# Patient Record
Sex: Female | Born: 1990 | Race: White | Hispanic: No | Smoking: Current some day smoker
Health system: Southern US, Community
[De-identification: ages and names within clinical notes are randomized; demographics above are authoritative.]

## PROBLEM LIST (undated history)

## (undated) DIAGNOSIS — F1110 Opioid abuse, uncomplicated: Secondary | ICD-10-CM

## (undated) DIAGNOSIS — K829 Disease of gallbladder, unspecified: Secondary | ICD-10-CM

## (undated) DIAGNOSIS — B1920 Unspecified viral hepatitis C without hepatic coma: Secondary | ICD-10-CM

---
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
## 2013-04-26 ENCOUNTER — Emergency Department (HOSPITAL_COMMUNITY)
Admission: EM | Admit: 2013-04-26 | Discharge: 2013-04-26 | Discharge status: Against Medical Advice | Payer: Self-pay | Attending: Emergency Medicine | Admitting: Emergency Medicine

## 2013-04-26 ENCOUNTER — Encounter (HOSPITAL_COMMUNITY): Payer: Self-pay | Admitting: Emergency Medicine

## 2013-04-26 DIAGNOSIS — R112 Nausea with vomiting, unspecified: Secondary | ICD-10-CM | POA: Insufficient documentation

## 2013-04-26 DIAGNOSIS — R109 Unspecified abdominal pain: Secondary | ICD-10-CM | POA: Insufficient documentation

## 2013-04-26 DIAGNOSIS — F17200 Nicotine dependence, unspecified, uncomplicated: Secondary | ICD-10-CM | POA: Insufficient documentation

## 2013-04-26 HISTORY — DX: Disease of gallbladder, unspecified: K82.9

## 2013-04-26 HISTORY — DX: Unspecified viral hepatitis C without hepatic coma: B19.20

## 2013-04-26 LAB — CBC WITH DIFFERENTIAL/PLATELET
Lymphocytes Relative: 31 % (ref 12–46)
Lymphs Abs: 2.1 10*3/uL (ref 0.7–4.0)
Neutro Abs: 3.9 10*3/uL (ref 1.7–7.7)
Neutrophils Relative %: 59 % (ref 43–77)
Platelets: 227 10*3/uL (ref 150–400)
RBC: 4.23 MIL/uL (ref 3.87–5.11)
WBC: 6.7 10*3/uL (ref 4.0–10.5)

## 2013-04-26 LAB — COMPREHENSIVE METABOLIC PANEL WITH GFR
ALT: 41 U/L — ABNORMAL HIGH (ref 0–35)
AST: 36 U/L (ref 0–37)
Albumin: 3.5 g/dL (ref 3.5–5.2)
Alkaline Phosphatase: 106 U/L (ref 39–117)
BUN: 7 mg/dL (ref 6–23)
CO2: 27 meq/L (ref 19–32)
Calcium: 9.2 mg/dL (ref 8.4–10.5)
Chloride: 101 meq/L (ref 96–112)
Creatinine, Ser: 0.65 mg/dL (ref 0.50–1.10)
GFR calc Af Amer: >90 mL/min
GFR calc non Af Amer: >90 mL/min
Glucose, Bld: 89 mg/dL (ref 70–99)
Potassium: 3.6 meq/L (ref 3.5–5.1)
Sodium: 138 meq/L (ref 135–145)
Total Bilirubin: 0.2 mg/dL — ABNORMAL LOW (ref 0.3–1.2)
Total Protein: 7 g/dL (ref 6.0–8.3)

## 2013-04-26 LAB — URINE MICROSCOPIC-ADD ON

## 2013-04-26 LAB — URINALYSIS, ROUTINE W REFLEX MICROSCOPIC
Bilirubin Urine: NEGATIVE
Glucose, UA: NEGATIVE mg/dL
Hgb urine dipstick: NEGATIVE
Protein, ur: NEGATIVE mg/dL
Specific Gravity, Urine: 1.021 (ref 1.005–1.030)
Urobilinogen, UA: 1 mg/dL (ref 0.0–1.0)

## 2013-04-26 LAB — POCT PREGNANCY, URINE: Preg Test, Ur: NEGATIVE

## 2013-04-26 NOTE — ED Notes (Signed)
Patient has left the ED at this time.

## 2013-04-26 NOTE — ED Notes (Signed)
PT. REPORTS PERSISTENT RIGHT ABDOMINAL PAIN WITH NAUSEA AND VOMITTING FOR SEVERAL DAYS , DIAGNOSED WITH GALL BLADDER DISEASE LAST WEEK AT [facility] PRESCRIBED WITH ORAL ANTIBIOTIC AND REFERRED TO A SURGEON , DENIES FEVER OR CHILLS.

## 2013-04-28 LAB — URINE CULTURE: Colony Count: NO GROWTH

## 2014-07-29 ENCOUNTER — Emergency Department (HOSPITAL_COMMUNITY): Payer: Self-pay

## 2014-07-29 ENCOUNTER — Emergency Department (HOSPITAL_COMMUNITY)
Admission: EM | Admit: 2014-07-29 | Discharge: 2014-07-29 | Disposition: A | Discharge status: Home / Self Care | Payer: Self-pay | Attending: Emergency Medicine | Admitting: Emergency Medicine

## 2014-07-29 ENCOUNTER — Encounter (HOSPITAL_COMMUNITY): Payer: Self-pay | Admitting: Emergency Medicine

## 2014-07-29 DIAGNOSIS — Z8619 Personal history of other infectious and parasitic diseases: Secondary | ICD-10-CM | POA: Insufficient documentation

## 2014-07-29 DIAGNOSIS — W231XXA Caught, crushed, jammed, or pinched between stationary objects, initial encounter: Secondary | ICD-10-CM | POA: Insufficient documentation

## 2014-07-29 DIAGNOSIS — Z8719 Personal history of other diseases of the digestive system: Secondary | ICD-10-CM | POA: Insufficient documentation

## 2014-07-29 DIAGNOSIS — M79641 Pain in right hand: Secondary | ICD-10-CM

## 2014-07-29 DIAGNOSIS — Z720 Tobacco use: Secondary | ICD-10-CM | POA: Insufficient documentation

## 2014-07-29 DIAGNOSIS — S60221A Contusion of right hand, initial encounter: Secondary | ICD-10-CM | POA: Insufficient documentation

## 2014-07-29 DIAGNOSIS — Y92003 Bedroom of unspecified non-institutional (private) residence as the place of occurrence of the external cause: Secondary | ICD-10-CM | POA: Insufficient documentation

## 2014-07-29 DIAGNOSIS — Y9389 Activity, other specified: Secondary | ICD-10-CM | POA: Insufficient documentation

## 2014-07-29 DIAGNOSIS — Y998 Other external cause status: Secondary | ICD-10-CM | POA: Insufficient documentation

## 2014-07-29 DIAGNOSIS — M25531 Pain in right wrist: Secondary | ICD-10-CM

## 2014-07-29 MED ORDER — NAPROXEN 500 MG PO TABS: 500.0000 mg | ORAL_TABLET | Freq: Two times a day (BID) | ORAL | Status: DC | PRN

## 2014-07-29 MED ORDER — HYDROCODONE-ACETAMINOPHEN 5-325 MG PO TABS
1.0000 | ORAL_TABLET | Freq: Once | ORAL | Status: AC
  Administered 2014-07-29: 1 via ORAL
  Filled 2014-07-29: qty 1

## 2014-07-29 MED ORDER — HYDROCODONE-ACETAMINOPHEN 5-325 MG PO TABS: 1.0000 | ORAL_TABLET | Freq: Four times a day (QID) | ORAL | Status: DC | PRN

## 2014-07-29 NOTE — ED Notes (Signed)
Pt returned from xray

## 2014-07-29 NOTE — ED Notes (Signed)
Pt c/o left hand pain after smashing it in the drawer. Skin is intact, mild swelling present. NAD

## 2014-07-29 NOTE — ED Provider Notes (Signed)
CSN: [id number]     Arrival date & time 07/29/14  1734 History   None    This chart was scribed for non-physician practitioner working with Purvis SheffieldForrest Harrison, MD by Arlan OrganAshley Leger, ED Scribe. This patient was seen in room TR08C/TR08C and the patient's care was started at 5:38 PM.   No chief complaint on file.  Patient is a 23 y.o. female presenting with hand pain. The history is provided by the patient. No language interpreter was used.  Hand Pain This is a new problem. The current episode started 6 to 12 hours ago. The problem occurs constantly. The problem has been gradually worsening. Pertinent negatives include no chest pain, no abdominal pain, no headaches and no shortness of breath. The symptoms are aggravated by bending and twisting. Nothing relieves the symptoms. She has tried acetaminophen and a cold compress for the symptoms. The treatment provided no relief.    HPI Comments: Madeline Andrade is a 23 y.o. female who presents to the Emergency Department complaining of constant, moderate R hand pain with associated mild swelling x 6-7 hours that has progressively worsening which began after she smashed her hand between the wall in her bedroom and her dresser at 10am. Pain is described as achy, throbbing, and sharp. Currently pain is rated 8/10. Pain is exacerbated with all movement of hand and wrist as well as palpation of the area. No alleviating factors at this time. She has tried ice application and OTC Tylenol without any improvement for symptoms. Last dose 11 AM this morning. Endorses bruising and swelling to the area. She denies any numbness or paresthesias. No weakness. No CP, SOB, fever, or chills. No known allergies to medications. No other concerns this visit.  Past Medical History  Diagnosis Date  . Hepatitis C   . Gall bladder disease    Past Surgical History  Procedure Laterality Date  . Cesarean section     No family history on file. History  Substance Use Topics  .  Smoking status: Current Every Day Smoker  . Smokeless tobacco: Not on file  . Alcohol Use: Yes   OB History    No data available     Review of Systems  Respiratory: Negative for shortness of breath.   Cardiovascular: Negative for chest pain.  Gastrointestinal: Negative for abdominal pain.  Musculoskeletal: Positive for joint swelling (R hand) and arthralgias (R hand and wrist).  Skin: Positive for color change (bruising). Negative for wound.  Neurological: Negative for weakness, numbness and headaches.    A complete 10 system review of systems was obtained and all systems are negative except as noted in the HPI and PMH.    Allergies  Review of patient's allergies indicates no known allergies.  Home Medications   Prior to Admission medications   Not on File   Triage Vitals: BP 115/56 mmHg  Pulse 94  Temp(Src) 98.2 F (36.8 C) (Oral)  Ht 5\' 7"  (1.702 m)  Wt 130 lb (58.968 kg)  BMI 20.36 kg/m2  SpO2 97%  LMP 07/15/2014   Physical Exam  Constitutional: She is oriented to person, place, and time. Vital signs are normal. She appears well-developed and well-nourished.  Non-toxic appearance. No distress.  HENT:  Head: Normocephalic and atraumatic.  Mouth/Throat: Mucous membranes are normal.  Eyes: Conjunctivae and EOM are normal. Right eye exhibits no discharge. Left eye exhibits no discharge.  Neck: Normal range of motion. Neck supple.  Cardiovascular: Normal rate and intact distal pulses.   Distal pulses intact  Pulmonary/Chest: Effort normal. No respiratory distress.  Abdominal: Normal appearance. She exhibits no distension.  Musculoskeletal:       Right hand: She exhibits decreased range of motion (due to pain), tenderness, bony tenderness and swelling. She exhibits normal two-point discrimination, no deformity and no laceration. Normal sensation noted. Decreased strength (due to pain) noted.       Hands: R hand with tenderness to palpation at the base of the fourth and  fifth metacarpals as well as in the medial aspect of the wrist, overlying an area of swelling and bruise which appears several days old. Range of motion of the fingers fully intact although somewhat limited by pain. Wrist range of motion limited due to pain. Full range of motion intact to elbow. Grip strength and wrist extension strength slightly diminished due to pain. Sensation grossly intact in all digits. Distal pulses intact. No abrasions or wounds.  Neurological: She is alert and oriented to person, place, and time. No sensory deficit.  Sensation and strength as described above.  Skin: Skin is warm, dry and intact. Bruising noted. No rash noted.  Bruise over the dorsal aspect of the right hand at the base of the fourth and fifth metacarpals, appears to be several days old with a lightly brownish appearance to the bruise.  Psychiatric: She has a normal mood and affect.  Nursing note and vitals reviewed.   ED Course  Procedures (including critical care time)  DIAGNOSTIC STUDIES: Oxygen Saturation is 97% on RA, Normal by my interpretation.    COORDINATION OF CARE: 5:38 PM- Will order DG Wrist Complete R and DG Hand Complete R. Discussed treatment plan with pt at bedside and pt agreed to plan.     Labs Review Labs Reviewed - No data to display  Imaging Review Dg Wrist Complete Right  07/29/2014   CLINICAL DATA:  Blunt trauma to right hand. Pain and swelling on 5th metacarpal with difficulty extending fingers  EXAM: RIGHT WRIST - COMPLETE 3+ VIEW  COMPARISON:  None.  FINDINGS: There is no evidence of fracture or dislocation. There is no evidence of arthropathy or other focal bone abnormality. Soft tissues are unremarkable.  IMPRESSION: No fracture or dislocation.   Electronically Signed   By: Genevive BiStewart  Edmunds M.D.   On: 07/29/2014 18:25   Dg Hand Complete Right  07/29/2014   CLINICAL DATA:  Initial encounter hand crush between wall and bedroom dresser tonight with pain and swelling fifth  metacarpal  EXAM: RIGHT HAND - COMPLETE 3+ VIEW  COMPARISON:  None.  FINDINGS: There is no evidence of fracture or dislocation. There is no evidence of arthropathy or other focal bone abnormality. Soft tissues are unremarkable.  IMPRESSION: Negative.   Electronically Signed   By: Esperanza Heiraymond  Rubner M.D.   On: 07/29/2014 18:23     EKG Interpretation None      MDM   Final diagnoses:  Wrist pain, acute, right  Contusion, hand, right, initial encounter  Pain of right hand    23y/o female with right hand pain after having it smashed between the wall and a dresser. Tenderness at the base of the fourth and fifth metacarpals as well as in the wrist. Neurovascularly intact with soft compartments. I question her story since the injury looks more consistent with a boxer's injury and the bruise appears several days old. Will obtain x-rays of hand and wrist to evaluate for fracture or dislocation. Will give Vicodin for pain control here.  6:34 PM Xrays negative for fx. Will  place in wrist splint for comfort. Discussed RICE therapy and rx given for pain control. Discussed f/up with hand specialist as needed for ongoing pain in 1-2wks. I explained the diagnosis and have given explicit precautions to return to the ER including for any other new or worsening symptoms. The patient understands and accepts the medical plan as it's been dictated and I have answered their questions. Discharge instructions concerning home care and prescriptions have been given. The patient is STABLE and is discharged to home in good condition.   I personally performed the services described in this documentation, which was scribed in my presence. The recorded information has been reviewed and is accurate.   BP 115/56 mmHg  Pulse 94  Temp(Src) 98.2 F (36.8 C) (Oral)  Ht 5\' 7"  (1.702 m)  Wt 130 lb (58.968 kg)  BMI 20.36 kg/m2  SpO2 97%  LMP 07/15/2014  Meds ordered this encounter  Medications  . HYDROcodone-acetaminophen  (NORCO/VICODIN) 5-325 MG per tablet 1 tablet    Sig:   . HYDROcodone-acetaminophen (NORCO) 5-325 MG per tablet    Sig: Take 1-2 tablets by mouth every 6 (six) hours as needed for severe pain.    Dispense:  6 tablet    Refill:  0    Order Specific Question:  Supervising Provider    Answer:  Eber Hong D [[id number]]  . naproxen (NAPROSYN) 500 MG tablet    Sig: Take 1 tablet (500 mg total) by mouth 2 (two) times daily as needed for mild pain, moderate pain or headache (TAKE WITH MEALS.).    Dispense:  20 tablet    Refill:  0    Order Specific Question:  Supervising Provider    Answer:  Vida Roller [address] [place], PA-C 07/29/14 1840  Purvis Sheffield, MD 07/29/14 [phone]

## 2014-07-29 NOTE — Discharge Instructions (Signed)
Wear wrist brace as needed for comfort. Ice and elevate hand/wrist throughout the day. Alternate between naprosyn and vicodin for pain relief. Do not drive or operate machinery with pain medication use. Call orthopedist follow up today or tomorrow to schedule a followup appointment for recheck of ongoing hand/wrist pain in 1-2 weeks that can be canceled with a 24-48 hour notice if complete resolution of pain. Return to the ER for changes or worsening symptoms.   Hand Contusion  A hand contusion is a deep bruise to the hand. Contusions happen when an injury causes bleeding under the skin. Signs of bruising include pain, puffiness (swelling), and discolored skin. The contusion may turn blue, purple, or yellow. HOME CARE  Put ice on the injured area.  Put ice in a plastic bag.  Place a towel between your skin and the bag.  Leave the ice on for 15-20 minutes, 03-04 times a day.  Only take medicines as told by your doctor.  Use an elastic wrap only as told. You may remove the wrap for sleeping, showering, and bathing. Take the wrap off if you lose feeling (have numbness) in your fingers, or they turn blue or cold. Put the wrap on more loosely.  Keep the hand raised (elevated) with pillows.  Avoid using your hand too much if it is painful. GET HELP RIGHT AWAY IF:   You have more redness, puffiness, or pain in your hand.  Your puffiness or pain does not get better with medicine.  You lose feeling in your hand, or you cannot move your fingers.  Your hand turns cold or blue.  You have pain when you move your fingers.  Your hand feels warm.  Your contusion does not get better in 2 days. MAKE SURE YOU:   Understand these instructions.  Will watch this condition.  Will get help right away if you are not doing well or you get worse. Document Released: 02/14/2008 Document Revised: 01/12/2014 Document Reviewed: 02/19/2012 [facility] Patient Information 2015 [place], [place]. This  information is not intended to replace advice given to you by your health care provider. Make sure you discuss any questions you have with your health care provider.  Wrist Pain A wrist sprain happens when the bands of tissue that hold the wrist joints together (ligament) stretch too much or tear. A wrist strain happens when muscles or bands of tissue that connect muscles to bones (tendons) are stretched or pulled. HOME CARE  Put ice on the injured area.  Put ice in a plastic bag.  Place a towel between your skin and the bag.  Leave the ice on for 15-20 minutes, 03-04 times a day, for the first 2 days.  Raise (elevate) the injured wrist to lessen puffiness (swelling).  Rest the injured wrist for at least 48 hours or as told by your doctor.  Wear a splint, cast, or an elastic wrap as told by your doctor.  Only take medicine as told by your doctor.  Follow up with your doctor as told. This is important. GET HELP RIGHT AWAY IF:   The fingers are puffy, very red, white, or cold and blue.  The fingers lose feeling (numb) or tingle.  The pain gets worse.  It is hard to move the fingers. MAKE SURE YOU:   Understand these instructions.  Will watch your condition.  Will get help right away if you are not doing well or get worse. Document Released: 02/14/2008 Document Revised: 11/20/2011 Document Reviewed: 10/19/2010 ExitCare Patient Information  2015 [employer]. This information is not intended to replace advice given to you by your health care provider. Make sure you discuss any questions you have with your health care provider.  Cryotherapy Cryotherapy is when you put ice on your injury. Ice helps lessen pain and puffiness (swelling) after an injury. Ice works the best when you start using it in the first 24 to 48 hours after an injury. HOME CARE  Put a dry or damp towel between the ice pack and your skin.  You may press gently on the ice pack.  Leave the ice on for no  more than 10 to 20 minutes at a time.  Check your skin after 5 minutes to make sure your skin is okay.  Rest at least 20 minutes between ice pack uses.  Stop using ice when your skin loses feeling (numbness).  Do not use ice on someone who cannot tell you when it hurts. This includes small children and people with memory problems (dementia). GET HELP RIGHT AWAY IF:  You have white spots on your skin.  Your skin turns blue or pale.  Your skin feels waxy or hard.  Your puffiness gets worse. MAKE SURE YOU:   Understand these instructions.  Will watch your condition.  Will get help right away if you are not doing well or get worse. Document Released: 02/14/2008 Document Revised: 11/20/2011 Document Reviewed: 04/20/2011 [facility] Patient Information 2015 [place], [place]. This information is not intended to replace advice given to you by your health care provider. Make sure you discuss any questions you have with your health care provider.

## 2015-01-03 ENCOUNTER — Emergency Department (HOSPITAL_COMMUNITY): Payer: Self-pay

## 2015-01-03 ENCOUNTER — Encounter (HOSPITAL_COMMUNITY): Payer: Self-pay | Admitting: Emergency Medicine

## 2015-01-03 ENCOUNTER — Emergency Department (HOSPITAL_COMMUNITY)
Admission: EM | Admit: 2015-01-03 | Discharge: 2015-01-04 | Disposition: A | Discharge status: Home / Self Care | Payer: Self-pay | Attending: Emergency Medicine | Admitting: Emergency Medicine

## 2015-01-03 DIAGNOSIS — S82002A Unspecified fracture of left patella, initial encounter for closed fracture: Secondary | ICD-10-CM

## 2015-01-03 DIAGNOSIS — Y999 Unspecified external cause status: Secondary | ICD-10-CM | POA: Insufficient documentation

## 2015-01-03 DIAGNOSIS — S82092A Other fracture of left patella, initial encounter for closed fracture: Secondary | ICD-10-CM | POA: Insufficient documentation

## 2015-01-03 DIAGNOSIS — S0181XA Laceration without foreign body of other part of head, initial encounter: Secondary | ICD-10-CM | POA: Insufficient documentation

## 2015-01-03 DIAGNOSIS — S060X0A Concussion without loss of consciousness, initial encounter: Secondary | ICD-10-CM | POA: Insufficient documentation

## 2015-01-03 DIAGNOSIS — Y9389 Activity, other specified: Secondary | ICD-10-CM | POA: Insufficient documentation

## 2015-01-03 DIAGNOSIS — S3992XA Unspecified injury of lower back, initial encounter: Secondary | ICD-10-CM | POA: Insufficient documentation

## 2015-01-03 DIAGNOSIS — S8991XA Unspecified injury of right lower leg, initial encounter: Secondary | ICD-10-CM | POA: Insufficient documentation

## 2015-01-03 DIAGNOSIS — Z8619 Personal history of other infectious and parasitic diseases: Secondary | ICD-10-CM | POA: Insufficient documentation

## 2015-01-03 DIAGNOSIS — Y92410 Unspecified street and highway as the place of occurrence of the external cause: Secondary | ICD-10-CM | POA: Insufficient documentation

## 2015-01-03 DIAGNOSIS — S99911A Unspecified injury of right ankle, initial encounter: Secondary | ICD-10-CM | POA: Insufficient documentation

## 2015-01-03 DIAGNOSIS — Z720 Tobacco use: Secondary | ICD-10-CM | POA: Insufficient documentation

## 2015-01-03 DIAGNOSIS — Z8719 Personal history of other diseases of the digestive system: Secondary | ICD-10-CM | POA: Insufficient documentation

## 2015-01-03 DIAGNOSIS — S24109A Unspecified injury at unspecified level of thoracic spinal cord, initial encounter: Secondary | ICD-10-CM | POA: Insufficient documentation

## 2015-01-03 DIAGNOSIS — S0990XA Unspecified injury of head, initial encounter: Secondary | ICD-10-CM | POA: Insufficient documentation

## 2015-01-03 DIAGNOSIS — S99912A Unspecified injury of left ankle, initial encounter: Secondary | ICD-10-CM | POA: Insufficient documentation

## 2015-01-03 LAB — I-STAT CHEM 8, ED
BUN: 5 mg/dL — ABNORMAL LOW (ref 6–23)
CALCIUM ION: 1.18 mmol/L (ref 1.12–1.23)
CHLORIDE: 106 mmol/L (ref 96–112)
Creatinine, Ser: 0.8 mg/dL (ref 0.50–1.10)
Glucose, Bld: 123 mg/dL — ABNORMAL HIGH (ref 70–99)
HCT: 43 % (ref 36.0–46.0)
Hemoglobin: 14.6 g/dL (ref 12.0–15.0)
Potassium: 3.3 mmol/L — ABNORMAL LOW (ref 3.5–5.1)
Sodium: 142 mmol/L (ref 135–145)
TCO2: 19 mmol/L (ref 0–100)

## 2015-01-03 LAB — URINALYSIS, ROUTINE W REFLEX MICROSCOPIC
BILIRUBIN URINE: NEGATIVE
Glucose, UA: NEGATIVE mg/dL
Hgb urine dipstick: NEGATIVE
KETONES UR: NEGATIVE mg/dL
Leukocytes, UA: NEGATIVE
Nitrite: NEGATIVE
Protein, ur: NEGATIVE mg/dL
Specific Gravity, Urine: 1.013 (ref 1.005–1.030)
UROBILINOGEN UA: 1 mg/dL (ref 0.0–1.0)
pH: 7 (ref 5.0–8.0)

## 2015-01-03 LAB — COMPREHENSIVE METABOLIC PANEL
ALT: 26 U/L (ref 0–35)
ANION GAP: 14 (ref 5–15)
AST: 28 U/L (ref 0–37)
Albumin: 3.9 g/dL (ref 3.5–5.2)
Alkaline Phosphatase: 99 U/L (ref 39–117)
BILIRUBIN TOTAL: 0.4 mg/dL (ref 0.3–1.2)
BUN: 6 mg/dL (ref 6–23)
CALCIUM: 9.4 mg/dL (ref 8.4–10.5)
CO2: 19 mmol/L (ref 19–32)
CREATININE: 0.83 mg/dL (ref 0.50–1.10)
Chloride: 107 mmol/L (ref 96–112)
GFR calc Af Amer: >90 mL/min (ref >90)
GFR calc non Af Amer: >90 mL/min (ref >90)
GLUCOSE: 119 mg/dL — AB (ref 70–99)
Potassium: 3.3 mmol/L — ABNORMAL LOW (ref 3.5–5.1)
SODIUM: 140 mmol/L (ref 135–145)
TOTAL PROTEIN: 7 g/dL (ref 6.0–8.3)

## 2015-01-03 LAB — ETHANOL: Alcohol, Ethyl (B): <5 mg/dL (ref 0–9)

## 2015-01-03 LAB — SAMPLE TO BLOOD BANK

## 2015-01-03 LAB — CBC
HCT: 41.6 % (ref 36.0–46.0)
Hemoglobin: 14.2 g/dL (ref 12.0–15.0)
MCH: 29.8 pg (ref 26.0–34.0)
MCHC: 34.1 g/dL (ref 30.0–36.0)
MCV: 87.2 fL (ref 78.0–100.0)
PLATELETS: 306 10*3/uL (ref 150–400)
RBC: 4.77 MIL/uL (ref 3.87–5.11)
RDW: 12.5 % (ref 11.5–15.5)
WBC: 13 10*3/uL — AB (ref 4.0–10.5)

## 2015-01-03 LAB — I-STAT BETA HCG BLOOD, ED (MC, WL, AP ONLY): I-stat hCG, quantitative: <5 m[IU]/mL (ref <5)

## 2015-01-03 LAB — PROTIME-INR
INR: 1.04 (ref 0.00–1.49)
PROTHROMBIN TIME: 13.7 s (ref 11.6–15.2)

## 2015-01-03 LAB — I-STAT CG4 LACTIC ACID, ED: Lactic Acid, Venous: 2.06 mmol/L (ref 0.5–2.0)

## 2015-01-03 MED ORDER — HYDROMORPHONE HCL 1 MG/ML IJ SOLN
1.0000 mg | Freq: Once | INTRAMUSCULAR | Status: AC
  Administered 2015-01-03: 1 mg via INTRAVENOUS

## 2015-01-03 MED ORDER — HYDROMORPHONE HCL 1 MG/ML IJ SOLN
INTRAMUSCULAR | Status: AC
  Filled 2015-01-03: qty 1

## 2015-01-03 MED ORDER — IOHEXOL 300 MG/ML  SOLN
100.0000 mL | Freq: Once | INTRAMUSCULAR | Status: AC | PRN
  Administered 2015-01-03: 100 mL via INTRAVENOUS

## 2015-01-03 MED ORDER — HYDROMORPHONE HCL 1 MG/ML IJ SOLN
1.0000 mg | Freq: Once | INTRAMUSCULAR | Status: AC
  Administered 2015-01-03: 1 mg via INTRAVENOUS
  Filled 2015-01-03: qty 1

## 2015-01-03 MED ORDER — ONDANSETRON HCL 4 MG/2ML IJ SOLN
INTRAMUSCULAR | Status: AC
  Filled 2015-01-03: qty 2

## 2015-01-03 MED ORDER — ONDANSETRON HCL 4 MG/2ML IJ SOLN
4.0000 mg | Freq: Once | INTRAMUSCULAR | Status: AC
  Administered 2015-01-03: 4 mg via INTRAVENOUS

## 2015-01-03 MED ORDER — HYDROMORPHONE HCL 1 MG/ML IJ SOLN
1.0000 mg | Freq: Once | INTRAMUSCULAR | Status: AC
Start: 2015-01-03 — End: 2015-01-03
  Administered 2015-01-03: 1 mg via INTRAVENOUS
  Filled 2015-01-03: qty 1

## 2015-01-03 NOTE — ED Notes (Signed)
Returned from xray

## 2015-01-03 NOTE — ED Notes (Signed)
EMS administered of fentanyl

## 2015-01-03 NOTE — ED Notes (Signed)
Dr Jeraldine LootsLockwood given a copy of lactic acid results 2.06

## 2015-01-03 NOTE — ED Notes (Signed)
To x-ray
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
## 2015-01-03 NOTE — ED Notes (Signed)
Family at beside. Family given emotional support. 

## 2015-01-03 NOTE — ED Notes (Signed)
Patient returned from CT, requesting pain medication.

## 2015-01-03 NOTE — ED Notes (Signed)
Patient transported to CT and xray 

## 2015-01-04 MED ORDER — HYDROCODONE-ACETAMINOPHEN 5-325 MG PO TABS
1.0000 | ORAL_TABLET | Freq: Once | ORAL | Status: AC
  Administered 2015-01-04: 1 via ORAL
  Filled 2015-01-04: qty 1

## 2015-01-04 MED ORDER — HYDROCODONE-ACETAMINOPHEN 5-325 MG PO TABS: 1.0000 | ORAL_TABLET | Freq: Four times a day (QID) | ORAL | Status: DC | PRN

## 2015-01-04 MED ORDER — HYDROMORPHONE HCL 1 MG/ML IJ SOLN: 1.0000 mg | Freq: Once | INTRAMUSCULAR | Status: DC

## 2015-01-04 MED ORDER — IBUPROFEN 600 MG PO TABS: 600.0000 mg | ORAL_TABLET | Freq: Four times a day (QID) | ORAL | Status: DC | PRN

## 2015-01-04 MED ORDER — HYDROMORPHONE HCL 1 MG/ML IJ SOLN
INTRAMUSCULAR | Status: AC
  Filled 2015-01-04: qty 1

## 2015-01-04 MED ORDER — HYDROMORPHONE HCL 1 MG/ML IJ SOLN
1.0000 mg | Freq: Once | INTRAMUSCULAR | Status: AC
  Administered 2015-01-04: 1 mg via INTRAVENOUS
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
## 2015-01-04 NOTE — ED Notes (Signed)
Duplicate order discontinued.  
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
## 2015-01-04 NOTE — Discharge Instructions (Signed)
Patellar Fracture, Adult °A patellar fracture is a break in your kneecap (patella).  °CAUSES  °· A direct blow to the knee or a fall is usually the cause of a broken patella. °· A very hard and strong bending of your knee can cause a patellar fracture. °RISK FACTORS °Involvement in contact sports, especially sports that involve a lot of jumping. °SIGNS AND SYMPTOMS  °· Tender and swollen knee. °· Pain when you move your knee, especially when you try to straighten out your leg. °· Difficulty walking or putting weight on your knee. °· Misshapen knee (as if a bone is out of place). °DIAGNOSIS  °Patellar fracture is usually diagnosed with a physical exam and an X-ray exam. °TREATMENT  °Treatment depends on the type of fracture: °· If your patella is still in the right position after the fracture and you can still straighten your leg out, you can usually be treated with a splint or cast for 4-6 weeks. °· If your patella is broken into multiple small pieces but you are able to straighten your leg, you can usually be treated with a splint or cast for 4-6 weeks. Sometimes your patella may need to be removed before the cast is applied. °· If you cannot straighten out your leg after a patellar fracture, then surgery is required to hold the bony fragments together until they heal. A cast or splint will be applied for 4-6 weeks. °HOME CARE INSTRUCTIONS  °· Only take over-the-counter or prescription medicines for pain, discomfort, or fever as directed by your health care provider. °· Use crutches as directed, and exercise the leg as directed. °· Apply ice to the injured area: °¨ Put ice in a plastic bag. °¨ Place a towel between your skin and the bag. °¨ Leave the ice on for 20 minutes, 2-3 times a day. °· Elevate the affected knee above the level of your heart. °SEEK MEDICAL CARE IF: °· You suspect you have significantly injured your knee. °· You hear a pop after a knee injury. °· Your knee is misshapen after a knee  injury. °· You have pain when you move your knee. °· You have difficulty walking or putting weight on your knee. °· You cannot fully move your knee. °SEEK IMMEDIATE MEDICAL CARE IF: °· You have redness, swelling, or increasing pain in your knee. °· You have a fever. °Document Released: 05/27/2003 Document Revised: 06/18/2013 Document Reviewed: 04/09/2013 °[employer]® Patient Information ©2015 [employer]. This information is not intended to replace advice given to you by your health care provider. Make sure you discuss any questions you have with your health care provider. ° °

## 2015-01-14 LAB — CDS SEROLOGY

## 2015-02-18 ENCOUNTER — Emergency Department (HOSPITAL_COMMUNITY)
Admission: EM | Admit: 2015-02-18 | Discharge: 2015-02-19 | Disposition: A | Discharge status: Home / Self Care | Payer: Self-pay | Attending: Emergency Medicine | Admitting: Emergency Medicine

## 2015-02-18 ENCOUNTER — Encounter (HOSPITAL_COMMUNITY): Payer: Self-pay | Admitting: Emergency Medicine

## 2015-02-18 DIAGNOSIS — Z720 Tobacco use: Secondary | ICD-10-CM | POA: Insufficient documentation

## 2015-02-18 DIAGNOSIS — Z8719 Personal history of other diseases of the digestive system: Secondary | ICD-10-CM | POA: Insufficient documentation

## 2015-02-18 DIAGNOSIS — Z8619 Personal history of other infectious and parasitic diseases: Secondary | ICD-10-CM | POA: Insufficient documentation

## 2015-02-18 DIAGNOSIS — L089 Local infection of the skin and subcutaneous tissue, unspecified: Secondary | ICD-10-CM | POA: Insufficient documentation

## 2015-02-18 MED ORDER — SULFAMETHOXAZOLE-TRIMETHOPRIM 800-160 MG PO TABS: 1.0000 | ORAL_TABLET | Freq: Two times a day (BID) | ORAL | Status: AC

## 2015-02-18 MED ORDER — SULFAMETHOXAZOLE-TRIMETHOPRIM 800-160 MG PO TABS
1.0000 | ORAL_TABLET | Freq: Once | ORAL | Status: AC
  Administered 2015-02-19: 1 via ORAL
  Filled 2015-02-18: qty 1

## 2015-02-18 NOTE — ED Notes (Signed)
Pt. reports itchy / painful rashes at scalp onset last month .

## 2015-02-18 NOTE — Discharge Instructions (Signed)
Take Bactrim as directed until gone. Follow up with [facility] for further evaluation.

## 2015-02-18 NOTE — ED Provider Notes (Signed)
CSN: [id number]     Arrival date & time 02/18/15  2204 History  This chart was scribed for non-physician practitioner, Emilia Beck, PA-C working with Gilda Crease, MD by Freida Busman, ED Scribe. This patient was seen in room TR06C/TR06C and the patient's care was started at 11:03 PM.    No chief complaint on file.   The history is provided by the patient. No language interpreter was used.     HPI Comments:  Madeline Andrade is a 24 y.o. female who presents to the Emergency Department complaining of multiple rashy patches to her  scalp for one month. She reports associated moderate pain to the areas that are exacerbated when she lies down or tries to comb her hair. She denies recent changes in lotions and shampoos, history of eczema and psoriasis. She has taken tylenol for pain without relief. She also denies fever, chills, and diaphoresis.    Past Medical History  Diagnosis Date  . Hepatitis C   . Gall bladder disease    Past Surgical History  Procedure Laterality Date  . Cesarean section     No family history on file. History  Substance Use Topics  . Smoking status: Current Every Day Smoker -- 1.00 packs/day    Types: Cigarettes  . Smokeless tobacco: Not on file  . Alcohol Use: Yes     Comment: wine occasional   OB History    No data available     Review of Systems  Constitutional: Negative for fever, chills and diaphoresis.  Skin: Positive for rash (Scalp).  All other systems reviewed and are negative.     Allergies  Review of patient's allergies indicates no known allergies.  Home Medications   Prior to Admission medications   Medication Sig Start Date End Date Taking? Authorizing Provider  HYDROcodone-acetaminophen (NORCO) 5-325 MG per tablet Take 1 tablet by mouth every 6 (six) hours as needed for severe pain. 01/04/15   Loma Boston, MD  ibuprofen (ADVIL,MOTRIN) 600 MG tablet Take 1 tablet (600 mg total) by mouth every 6 (six) hours as needed.  01/04/15   Loma Boston, MD  naproxen (NAPROSYN) 500 MG tablet Take 1 tablet (500 mg total) by mouth 2 (two) times daily as needed for mild pain, moderate pain or headache (TAKE WITH MEALS.). Patient not taking: Reported on 01/03/2015 07/29/14   Mercedes Camprubi-Soms, PA-C   BP 134/86 mmHg  Pulse 107  Temp(Src) 98.8 F (37.1 C) (Oral)  Resp 14  Ht 5\' 7"  (1.702 m)  Wt 128 lb (58.06 kg)  BMI 20.04 kg/m2  SpO2 100%  LMP 01/18/2015 Physical Exam  Constitutional: She is oriented to person, place, and time. She appears well-developed and well-nourished. No distress.  HENT:  Head: Normocephalic and atraumatic.  Eyes: Conjunctivae and EOM are normal.  Neck: Normal range of motion. No tracheal deviation present.  Cardiovascular: Normal rate, regular rhythm and normal heart sounds.   Pulmonary/Chest: Effort normal and breath sounds normal. No respiratory distress. She has no wheezes.  Abdominal: She exhibits no distension. There is no tenderness. There is no rebound.  Musculoskeletal: Normal range of motion.  Neurological: She is alert and oriented to person, place, and time. Coordination normal.  Skin: Skin is warm and dry.  Scattered scabbed papules of generalized scalp without drainage   Psychiatric: She has a normal mood and affect. Her behavior is normal.  Nursing note and vitals reviewed.   ED Course  Procedures   DIAGNOSTIC STUDIES:  Oxygen Saturation is  100% on RA, normal by my interpretation.    COORDINATION OF CARE:  11:07 PM Will treat for bacterial infection with antibiotic.Discussed treatment plan with pt at bedside and pt agreed to plan.  Labs Review Labs Reviewed - No data to display  Imaging Review No results found.   EKG Interpretation None      MDM   Final diagnoses:  Infection of scalp    Patient appears to have bacterial infection of scalp and will be started on bactrim. Patient advised to follow up with [facility].   I  personally performed the services described in this documentation, which was scribed in my presence. The recorded information has been reviewed and is accurate.      Emilia Beck, PA-C 02/19/15 1610  Gilda Crease, MD 02/19/15 [phone]

## 2015-02-19 ENCOUNTER — Encounter (HOSPITAL_COMMUNITY): Payer: Self-pay | Admitting: Emergency Medicine

## 2015-02-19 ENCOUNTER — Emergency Department (HOSPITAL_COMMUNITY)
Admission: EM | Admit: 2015-02-19 | Discharge: 2015-02-19 | Disposition: A | Discharge status: Home / Self Care | Payer: Self-pay | Attending: Emergency Medicine | Admitting: Emergency Medicine

## 2015-02-19 DIAGNOSIS — Z720 Tobacco use: Secondary | ICD-10-CM | POA: Insufficient documentation

## 2015-02-19 DIAGNOSIS — R21 Rash and other nonspecific skin eruption: Secondary | ICD-10-CM | POA: Insufficient documentation

## 2015-02-19 DIAGNOSIS — Z8619 Personal history of other infectious and parasitic diseases: Secondary | ICD-10-CM | POA: Insufficient documentation

## 2015-02-19 DIAGNOSIS — F1110 Opioid abuse, uncomplicated: Secondary | ICD-10-CM | POA: Insufficient documentation

## 2015-02-19 DIAGNOSIS — Z8719 Personal history of other diseases of the digestive system: Secondary | ICD-10-CM | POA: Insufficient documentation

## 2015-02-19 DIAGNOSIS — Z792 Long term (current) use of antibiotics: Secondary | ICD-10-CM | POA: Insufficient documentation

## 2015-02-19 HISTORY — DX: Opioid abuse, uncomplicated: F11.10

## 2015-02-19 NOTE — ED Provider Notes (Signed)
CSN: [id number]     Arrival date & time 02/19/15  0039 History   First MD Initiated Contact with Patient 02/19/15 0115     This chart was scribed for Mirian Mo, MD by Arlan Organ, ED Scribe. This patient was seen in room D33C/D33C and the patient's care was started 1:38 AM.   Chief Complaint  Patient presents with  . Drug Problem   The history is provided by the patient. No language interpreter was used.    HPI Comments: Madeline Andrade is a 24 y.o. female with a PMHx of heroin abuse and hepatitis C who presents to the Emergency Department here for a drug problem this evening. Pt is requesting detox from a heroin addiction. Last heroin use this evening. No recent fever, chills, nausea, vomiting, diarrhea. No known allergies to medications.  Past Medical History  Diagnosis Date  . Hepatitis C   . Gall bladder disease   . Heroin abuse    Past Surgical History  Procedure Laterality Date  . Cesarean section     No family history on file. History  Substance Use Topics  . Smoking status: Current Every Day Smoker -- 1.00 packs/day    Types: Cigarettes  . Smokeless tobacco: Not on file  . Alcohol Use: Yes     Comment: wine occasional   OB History    No data available     Review of Systems  Constitutional: Negative for fever and chills.  HENT: Negative for congestion.   Respiratory: Negative for cough and shortness of breath.   Cardiovascular: Negative for chest pain.  Gastrointestinal: Negative for nausea, abdominal pain and diarrhea.  All other systems reviewed and are negative.     Allergies  Review of patient's allergies indicates no known allergies.  Home Medications   Prior to Admission medications   Medication Sig Start Date End Date Taking? Authorizing Provider  HYDROcodone-acetaminophen (NORCO) 5-325 MG per tablet Take 1 tablet by mouth every 6 (six) hours as needed for severe pain. 01/04/15   Loma Boston, MD  ibuprofen (ADVIL,MOTRIN) 600 MG tablet  Take 1 tablet (600 mg total) by mouth every 6 (six) hours as needed. 01/04/15   Loma Boston, MD  naproxen (NAPROSYN) 500 MG tablet Take 1 tablet (500 mg total) by mouth 2 (two) times daily as needed for mild pain, moderate pain or headache (TAKE WITH MEALS.). Patient not taking: Reported on 01/03/2015 07/29/14   Mercedes Camprubi-Soms, PA-C  sulfamethoxazole-trimethoprim (BACTRIM DS,SEPTRA DS) 800-160 MG per tablet Take 1 tablet by mouth 2 (two) times daily. 02/18/15 02/25/15  Emilia Beck, PA-C   Triage Vitals: BP 120/65 mmHg  Pulse 96  Temp(Src) 98.4 F (36.9 C) (Oral)  Resp 16  Ht 5\' 7"  (1.702 m)  Wt 131 lb (59.421 kg)  BMI 20.51 kg/m2  SpO2 100%  LMP 01/18/2015   Physical Exam  Constitutional: She is oriented to person, place, and time. She appears well-developed and well-nourished.  HENT:  Head: Normocephalic and atraumatic.  Right Ear: External ear normal.  Left Ear: External ear normal.  Eyes: Conjunctivae and EOM are normal. Pupils are equal, round, and reactive to light.  Neck: Normal range of motion. Neck supple.  Cardiovascular: Normal rate, regular rhythm, normal heart sounds and intact distal pulses.   Pulmonary/Chest: Effort normal and breath sounds normal.  Abdominal: Soft. Bowel sounds are normal. There is no tenderness.  Musculoskeletal: Normal range of motion.  Neurological: She is alert and oriented to person, place, and time.  Skin:  Skin is warm and dry.  Vitals reviewed.   ED Course  Procedures (including critical care time)  DIAGNOSTIC STUDIES: Oxygen Saturation is 100% on RA, Normal by my interpretation.    COORDINATION OF CARE: 1:39 AM-Discussed treatment plan with pt at bedside and pt agreed to plan.     Labs Review Labs Reviewed - No data to display  Imaging Review No results found.   EKG Interpretation None      MDM   Final diagnoses:  None    24 y.o. female with pertinent PMH of recent visit for rash today presents with  request for inpatient detoxification from heroin. She denies medical complaint. Physical exam benign. Discussed that this was no longer available and the patient was given outpatient resources. DC home in stable condition with standard return precautions.    I have reviewed all laboratory and imaging studies if ordered as above  1. Heroin abuse           Mirian Mo, MD 02/19/15 [phone]

## 2015-02-19 NOTE — ED Notes (Signed)
Pt. requesting detox from Heroin addiction . Last Heroin intake this morning .

## 2015-02-19 NOTE — Discharge Instructions (Signed)
Opioid Withdrawal °Opioids are a group of narcotic drugs. They include the street drug heroin. They also include pain medicines, such as morphine, hydrocodone, oxycodone, and fentanyl. Opioid withdrawal is a group of characteristic physical and mental signs and symptoms. It typically occurs if you have been using opioids daily for several weeks or longer and stop using or rapidly decrease use. Opioid withdrawal can also occur if you have used opioids daily for a long time and are given a medicine to block the effect.  °SIGNS AND SYMPTOMS °Opioid withdrawal includes three or more of the following symptoms:  °· Depressed, anxious, or irritable mood. °· Nausea or vomiting. °· Muscle aches or spasms.   °· Watery eyes.    °· Runny nose. °· Dilated pupils, sweating, or hairs standing on end. °· Diarrhea or intestinal cramping. °· Yawning.   °· Fever. °· Increased blood pressure. °· Fast pulse. °· Restlessness or trouble sleeping. °These signs and symptoms occur within several hours of stopping or reducing short-acting opioids, such as heroin. They can occur within 3 days of stopping or reducing long-acting opioids, such as methadone. Withdrawal begins within minutes of receiving a drug that blocks the effects of opioids, such as naltrexone or naloxone. °DIAGNOSIS  °Opioid use disorder is diagnosed by your health care provider. You will be asked about your symptoms, drug and alcohol use, medical history, and use of medicines. A physical exam may be done. Lab tests may be ordered. Your health care provider may have you see a mental health professional.  °TREATMENT  °The treatment for opioid withdrawal is usually provided by medical doctors with special training in substance use disorders (addiction specialists). The following medicines may be included in treatment: °· Opioids given in place of the abused opioid. They turn on opioid receptors in the brain and lessen or prevent withdrawal symptoms. They are gradually  decreased (opioid substitution and taper). °· Non-opioids that can lessen certain opioid withdrawal symptoms. They may be used alone or with opioid substitution and taper. °Successful long-term recovery usually requires medicine, counseling, and group support. °HOME CARE INSTRUCTIONS  °· Take medicines only as directed by your health care provider. °· Check with your health care provider before starting new medicines. °· Keep all follow-up visits as directed by your health care provider. °SEEK MEDICAL CARE IF: °· You are not able to take your medicines as directed. °· Your symptoms get worse. °· You relapse. °SEEK IMMEDIATE MEDICAL CARE IF: °· You have serious thoughts about hurting yourself or others. °· You have a seizure. °· You lose consciousness. °Document Released: 08/31/2003 Document Revised: 01/12/2014 Document Reviewed: 09/10/2013 °ExitCare® Patient Information ©2015 [employer]. This information is not intended to replace advice given to you by your health care provider. Make sure you discuss any questions you have with your health care provider. ° °Opioid Use Disorder °Opioid use disorder is a mental disorder. It is the continued nonmedical use of opioids in spite of risks to health and well-being. Misused opioids include the street drug heroin. They also include pain medicines such as morphine, hydrocodone, oxycodone, and fentanyl. Opioids are very addictive. People who misuse opioids get an exaggerated feeling of well-being. Opioid use disorder often disrupts activities at home, work, or school. It may cause mental or physical problems.  °A family history of opioid use disorder puts you at higher risk of it. People with opioid use disorder often misuse other drugs or have mental illness such as depression, posttraumatic stress disorder, or antisocial personality disorder. They also are at   risk of suicide and death from overdose. °SIGNS AND SYMPTOMS  °Signs and symptoms of opioid use disorder  include: °· Use of opioids in larger amounts or over a longer period than intended. °· Unsuccessful attempts to cut down or control opioid use. °· A lot of time spent obtaining, using, or recovering from the effects of opioids. °· A strong desire or urge to use opioids (craving). °· Continued use of opioids in spite of major problems at work, school, or home because of use. °· Continued use of opioids in spite of relationship problems because of use. °· Giving up or cutting down on important life activities because of opioid use. °· Use of opioids over and over in situations when it is physically hazardous, such as driving a car. °· Continued use of opioids in spite of a physical problem that is likely related to use. Physical problems can include: °¨ Severe constipation. °¨ Poor nutrition. °¨ Infertility. °¨ Tuberculosis. °¨ Aspiration pneumonia. °¨ Infections such as human immunodeficiency virus (HIV) and hepatitis (from injecting opioids). °· Continued use of opioids in spite of a mental problem that is likely related to use. Mental problems can include: °¨ Depression. °¨ Anxiety. °¨ Hallucinations. °¨ Sleep problems. °¨ Loss of sexual function. °· Need to use more and more opioids to get the same effect, or lessened effect over time with use of the same amount (tolerance). °· Having withdrawal symptoms when opioid use is stopped, or using opioids to reduce or avoid withdrawal symptoms. Withdrawal symptoms include: °¨ Depressed, anxious, or irritable mood. °¨ Nausea, vomiting, diarrhea, or intestinal cramping. °¨ Muscle aches or spasms. °¨ Excessive tearing or runny nose. °¨ Dilated pupils, sweating, or hairs standing on end. °¨ Yawning. °¨ Fever, raised blood pressure, or fast pulse. °¨ Restlessness or trouble sleeping. This does not apply to people taking opioids for medical reasons only. °DIAGNOSIS °Opioid use disorder is diagnosed by your health care provider. You may be asked questions about your opioid use  and and how it affects your life. A physical exam may be done. A drug screen may be ordered. You may be referred to a mental health professional. The diagnosis of opioid use disorder requires at least two symptoms within 12 months. The type of opioid use disorder you have depends on the number of signs and symptoms you have. The type may be: °· Mild. Two or three signs and symptoms.    °· Moderate. Four or five signs and symptoms.   °· Severe. Six or more signs and symptoms. °TREATMENT  °Treatment is usually provided by mental health professionals with training in substance use disorders. The following options are available: °· Detoxification. This is the first step in treatment for withdrawal. It is medically supervised withdrawal with the use of medicines. These medicines lessen withdrawal symptoms. They also raise the chance of becoming opioid free. °· Counseling, also known as talk therapy. Talk therapy addresses the reasons you use opioids. It also addresses ways to keep you from using again (relapse). The goals of talk therapy are to avoid relapse by: °¨ Identifying and avoiding triggers for use. °¨ Finding healthy ways to cope with stress. °¨ Learning how to handle cravings. °· Support groups. Support groups provide emotional support, advice, and guidance. °· A medicine that blocks opioid receptors in your brain. This medicine can reduce opioid cravings that lead to relapse. This medicine also blocks the desired opioid effect when relapse occurs. °· Opioids that are taken by mouth in place of the misused opioid (opioid maintenance   treatment). These medicines satisfy cravings but are safer than commonly misused opioids. This often is the best option for people who continue to relapse with other treatments. °HOME CARE INSTRUCTIONS  °· Take medicines only as directed by your health care provider. °· Check with your health care provider before starting new medicines. °· Keep all follow-up visits as directed by  your health care provider. °SEEK MEDICAL CARE IF: °· You are not able to take your medicines as directed. °· Your symptoms get worse. °SEEK IMMEDIATE MEDICAL CARE IF: °· You have serious thoughts about hurting yourself or others. °· You may have taken an overdose of opioids. °FOR MORE INFORMATION °· National Institute on Drug Abuse: www.drugabuse.gov °· Substance Abuse and Mental Health Services Administration: www.samhsa.gov °Document Released: 06/25/2007 Document Revised: 01/12/2014 Document Reviewed: 09/10/2013 °ExitCare® Patient Information ©2015 [employer]. This information is not intended to replace advice given to you by your health care provider. Make sure you discuss any questions you have with your health care provider. ° ° °Emergency Department Resource Guide °1) Find a Doctor and Pay Out of Pocket °Although you won't have to find out who is covered by your insurance plan, it is a good idea to ask around and get recommendations. You will then need to call the office and see if the doctor you have chosen will accept you as a new patient and what types of options they offer for patients who are self-pay. Some doctors offer discounts or will set up payment plans for their patients who do not have insurance, but you will need to ask so you aren't surprised when you get to your appointment. ° °2) Contact Your Local Health Department °Not all health departments have doctors that can see patients for sick visits, but many do, so it is worth a call to see if yours does. If you don't know where your local health department is, you can check in your phone book. The CDC also has a tool to help you locate your state's health department, and many state websites also have listings of all of their local health departments. ° °3) Find a Walk-in Clinic °If your illness is not likely to be very severe or complicated, you may want to try a walk in clinic. These are popping up all over the country in pharmacies,  drugstores, and shopping centers. They're usually staffed by nurse practitioners or physician assistants that have been trained to treat common illnesses and complaints. They're usually fairly quick and inexpensive. However, if you have serious medical issues or chronic medical problems, these are probably not your best option. ° °No Primary Care Doctor: °- Call [facility] at  832-8000 - they can help you locate a primary care doctor that  accepts your insurance, provides certain services, etc. °- Physician Referral Service- 1-800-533-3463 ° °Chronic Pain Problems: °Organization         Address  Phone   Notes  °[facility]  (336) 297-2271 Patients need to be referred by their primary care doctor.  ° °Medication Assistance: °Organization         Address  Phone   Notes  °[place] [facility] [address] °[place], [place] [address] (336) 641-8030 --Must be a resident of [place] °-- Must have NO insurance coverage whatsoever (no Medicaid/ Medicare, etc.) °-- The pt. MUST have a primary care doctor that directs their care regularly and follows them in the community °  °[facility]  (866) 331-1348   °  [employer]  (888) 892-1162   ° °Agencies that provide inexpensive medical care: °Organization         Address  Phone   Notes  °[facility]  (336) 832-8035   °[facility]    (336) 832-7272   °[facility] [address] °[place], [place] [address] (336) 832-4777   °[facility] [address], °[place] (336) 271-4999   °[facility]    (336) 373-0678   °[facility]    (336) 272-1050   °[facility] ° [address], [place] Phone:  (336) 832-4444, Fax:  (336) 832-4440 Hours of Operation:  9 am - 6 pm, M-F.  Also accepts Medicaid/Medicare and self-pay.  °[facility] ° [address], [place] Phone:  (336) [phone], Fax: (336) [phone]. Hours of Operation:  8:30 am - 5:30 pm, M-F.  Also accepts Medicaid and self-pay.  °[facility] [place] [address], [place] Phone: (336) 878-6027   °[facility] [address], [place], [place] (336)723-1848, Ext. 123 Mondays & Thursdays: 7-9 AM.  First 15 patients are seen on a first come, first serve basis. °  ° °Medicaid-accepting [place] Providers: ° °Organization         Address  Phone   Notes  °[facility] [address], [place] (336) 641-2100 Also accepts self-pay patients.  °[facility] [address], [place] ° (336) 856-9996   °[facility] [address], [place] (336) 288-8857   °[facility] [address] [place] [address], [place] (336) 299-7000   °Veita Bland [address], [place]  ° (336) 373-1557 Only accepts [place] Medicaid patients after they have their name applied to their card.  ° °Self-Pay (no insurance) in [place]: ° °Organization         Address  Phone   Notes  °Sickle Cell Patients, [facility] [address], [place] (336) 832-1970   °[facility] [address], [place] (336) 832-4400   °[facility] [place] ° [address], [place] (336) 992-4800   °[facility]/Dr. Osei-Bonsu ° [address] [place] [address], [place] or [address], [place] (336) [phone] Phone number for both [place] and [place] locations is the same.  °[facility] [address], [place] (336) 299-0000   °[facility] [address] [place] [address], [place] or [address] (336) 852-7530 °(336) 878-2260   °[facility] [address], [place] (336) 350-1642, phone; (336) 294-5005, fax Sees patients 1st and 3rd Saturday of every month.  Must not qualify for public  or private insurance (i.e. Medicaid, Medicare, [place] Health Choice, Veterans' Benefits) • Household income should be no more than 200% of the poverty level •The clinic cannot treat you if you are pregnant or think you are pregnant • Sexually transmitted diseases are not treated at the clinic.  ° ° °Dental Care: °Organization         Address  Phone  Notes  °[facility] [address],  (336) 641-6152 Accepts children up to age 21 who are enrolled in Medicaid or  Health Choice; pregnant women with a Medicaid card;   and children who have applied for Medicaid or [place] Health Choice, but were declined, whose parents can pay a reduced fee at time of service.  °[facility] [place]  [address], [place] (336) 641-7733 Accepts children up to age 21 who are enrolled in Medicaid or [place] Health Choice; pregnant women with a Medicaid card; and children who have applied for Medicaid or [place] Health Choice, but were declined, whose parents can pay a reduced fee at time of service.  °[facility] ° [address], [place] (336) 641-4533 Patients are seen by appointment only. Walk-ins are not accepted. [facility] will see patients 18 years of age and older. °Monday - Tuesday (8am-5pm) °Most Wednesdays (8:30-5pm) °$30 per visit, cash only  °[facility] ° [address], [place] (336) 641-4533 Patients are seen by appointment only. Walk-ins are not accepted. [facility] will see patients 18 years of age and older. °One Wednesday Evening (Monthly: Volunteer Based).  $30 per visit, cash only  °[facility]  (919) 537-3737 for adults; Children under age 4, call Graduate Pediatric Dentistry at ([phone]) [phone]. Children aged 4-14, please call ([phone]) [phone] to request a pediatric application. ° Dental services are provided in all areas of  dental care including fillings, crowns and bridges, complete and partial dentures, implants, gum treatment, root canals, and extractions. Preventive care is also provided. Treatment is provided to both adults and children. °Patients are selected via a lottery and there is often a waiting list. °  °[facility] [address], °[place] ° (336) 763-8833 www.drcivils.[email or web address] °  °[facility] [address], [place], [place] (336)723-1848, Ext. 123 Second and Fourth Thursday of each month, opens at 6:30 AM; Clinic ends at 9 AM.  Patients are seen on a first-come first-served basis, and a limited number are seen during each clinic.  ° °[facility] ° [address], [place], [place] (336) 723-7904   Eligibility Requirements °You must have lived in [place], [place], or [place] counties for at least the last three months. °  You cannot be eligible for state or federal sponsored healthcare insurance, including Veterans Administration, Medicaid, or Medicare. °  You generally cannot be eligible for healthcare insurance through your employer.  °  How to apply: °Eligibility screenings are held every Tuesday and Wednesday afternoon from 1:00 pm until 4:00 pm. You do not need an appointment for the interview!  °[facility] [address], [place], [place] 336-631-2330   °[facility]  336-342-8273   °[facility]  336-703-3100   °[facility]  336-570-6415   ° °Behavioral Health Resources in the Community: °Intensive Outpatient Programs °Organization         Address  Phone  Notes  °[facility] [address], [place], [place] 336-878-6098   °[facility] [address], [place], [place] 336-832-9800   °[facility] [address], [place], [place] ° 336-882-2125   °[facility] [address],  °, [place] 1-800-853-5163 or  336-641-4981   °Substance Abuse Resources °Organization         Address  Phone  Notes  °[facility]  336-882-2125   °[facility]  336-784-9470   °[facility]  336-285-9073   °[facility]  336-845-3988   °  Residential & Outpatient Substance Abuse Program  1-800-659-3381   °Psychological Services °Organization         Address  Phone  Notes  °[facility]  336- 832-9600   °[facility]  336- 378-7881   °[facility] [address], [place] 1-800-853-5163 or 336-641-4981   ° °Mobile Crisis Teams °Organization         Address  Phone  Notes  °[facility], Mobile Crisis Care Unit  1-877-626-1772   °Assertive °[facility] ° [address] [place], [place] 336-834-9664   °Sharon DeEsch [address] °[place] 336-554-5454   ° °Self-Help/Support Groups °Organization         Address  Phone             Notes  °[facility] - variety of support groups  336- 373-1402 Call for more information  °Narcotics Anonymous (NA), [facility] [address], °[place]  2 meetings at this location  ° °Residential Treatment Programs °Organization         Address  Phone  Notes  °[facility] [address],    °[place]  1-866-801-8205   °[facility] ° [address], [place], [place] 704-293-8524   °[facility] [address], [place] 336-845-3988 Admissions: 8am-3pm M-F  °[facility] [address]    °[place], [place] 336-841-1104   °[facility] [address], [place], [place] 336-379-7146   °[facility] [address]  °[place], [place] 336-285-9073   °[facility] - Intensive Outpatient [address], [place], [place] 336-852-3033   °[facility] [address]  °[place], [place] 1-877-615-2722 or 336-784-9470   °[facility] [address] [place], [place] 336-227-7417 Accepts Medicaid  °[facility] [address]  °[place] 1-800-659-3381 Substance Abuse/Addiction Treatment  ° °[place] Behavioral Health Resources °Organization         Address  Phone  Notes  °[facility]  (888) 581-9988   °Julie Brannon, PhD [address] [place], [place]   (336) 349-5553 or (336) 951-0000   °[facility]   [address] °[place], [place] (336) 349-4454   °[facility] [address], [place], [place] (336) 342-8316 Insurance/Medicaid/sponsorship through [facility]  °[facility] [address]                                    [place], [place] (336) 342-8316 Therapy/tele-psych/case  °[facility] [address]  ° [place], [place] (336) 349-2233    °Dr. Arfeen  (336) 349-4544   °[facility] [place]  [employer] [place] [facility] 1) [address], [place] °2) [address], [place] °3)  [address], [place] (336) 349-3220 °(336) 342-7768 ° °(336) 342-8140   °[place] [facility] (336) 342-1394 or (336) 342-3537 (After Hours)    ° ° ° °

## 2015-08-27 ENCOUNTER — Ambulatory Visit (INDEPENDENT_AMBULATORY_CARE_PROVIDER_SITE_OTHER): Payer: Self-pay | Admitting: Family Medicine

## 2015-08-27 VITALS — BP 118/70 | HR 93 | Temp 97.3°F | Resp 18 | Ht 66.5 in | Wt 126.2 lb

## 2015-08-27 DIAGNOSIS — R599 Enlarged lymph nodes, unspecified: Secondary | ICD-10-CM

## 2015-08-27 DIAGNOSIS — R591 Generalized enlarged lymph nodes: Secondary | ICD-10-CM

## 2015-08-27 LAB — POCT CBC
Granulocyte percent: 62.3 %G (ref 37–80)
HCT, POC: 40.9 % (ref 37.7–47.9)
HEMOGLOBIN: 13.8 g/dL (ref 12.2–16.2)
Lymph, poc: 2.3 (ref 0.6–3.4)
MCH: 30.9 pg (ref 27–31.2)
MCHC: 33.9 g/dL (ref 31.8–35.4)
MCV: 91 fL (ref 80–97)
MID (cbc): 0.4 (ref 0–0.9)
MPV: 6.6 fL (ref 0–99.8)
PLATELET COUNT, POC: 254 10*3/uL (ref 142–424)
POC Granulocyte: 4.5 (ref 2–6.9)
POC LYMPH PERCENT: 32.4 %L (ref 10–50)
POC MID %: 5.3 % (ref 0–12)
RBC: 4.49 M/uL (ref 4.04–5.48)
RDW, POC: 12.7 %
WBC: 7.2 10*3/uL (ref 4.6–10.2)

## 2015-08-27 MED ORDER — AMOXICILLIN-POT CLAVULANATE 875-125 MG PO TABS
1.0000 | ORAL_TABLET | Freq: Two times a day (BID) | ORAL | Status: DC
Start: 2015-08-27 — End: 2015-11-29

## 2015-08-27 NOTE — Patient Instructions (Addendum)
Take Augmentin 875 mg one twice daily with food  Aleve (naproxen) 220 mg 2 pills twice daily as needed for pain  Apply either ice packs or heat to the neck or alternate the 2 to try to relieve some of the tightness and discomfort  If not improving over the next 10 days or so return for a recheck, sooner if worse  We will let you know the results of your additional labs in a few days.
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
## 2015-08-28 ENCOUNTER — Telehealth: Payer: Self-pay

## 2015-08-28 LAB — HIV ANTIBODY (ROUTINE TESTING W REFLEX): HIV 1&2 Ab, 4th Generation: NONREACTIVE

## 2015-08-28 NOTE — Telephone Encounter (Signed)
Dr. Alwyn RenHopper I spoke with Pt. Today she is saying the nodes on the side of her neck are still giving her pain. I saw where you told her to take aleve and she stated it wasn't working if you can prescribe hydrocodone.

## 2015-09-02 NOTE — Telephone Encounter (Signed)
Call: If continuing to have pain from the nodes please get rechecked.

## 2015-11-29 ENCOUNTER — Emergency Department (HOSPITAL_COMMUNITY): Payer: Self-pay

## 2015-11-29 ENCOUNTER — Inpatient Hospital Stay (HOSPITAL_COMMUNITY)
Admission: EM | Admit: 2015-11-29 | Discharge: 2015-12-04 | DRG: 871 | Discharge status: Against Medical Advice | Payer: Self-pay | Attending: Internal Medicine | Admitting: Internal Medicine

## 2015-11-29 ENCOUNTER — Encounter (HOSPITAL_COMMUNITY): Payer: Self-pay | Admitting: Emergency Medicine

## 2015-11-29 DIAGNOSIS — I071 Rheumatic tricuspid insufficiency: Secondary | ICD-10-CM | POA: Diagnosis present

## 2015-11-29 DIAGNOSIS — E43 Unspecified severe protein-calorie malnutrition: Secondary | ICD-10-CM | POA: Diagnosis present

## 2015-11-29 DIAGNOSIS — B9561 Methicillin susceptible Staphylococcus aureus infection as the cause of diseases classified elsewhere: Secondary | ICD-10-CM

## 2015-11-29 DIAGNOSIS — J40 Bronchitis, not specified as acute or chronic: Secondary | ICD-10-CM | POA: Diagnosis present

## 2015-11-29 DIAGNOSIS — J9690 Respiratory failure, unspecified, unspecified whether with hypoxia or hypercapnia: Secondary | ICD-10-CM | POA: Diagnosis present

## 2015-11-29 DIAGNOSIS — I76 Septic arterial embolism: Secondary | ICD-10-CM | POA: Diagnosis present

## 2015-11-29 DIAGNOSIS — F1110 Opioid abuse, uncomplicated: Secondary | ICD-10-CM | POA: Diagnosis present

## 2015-11-29 DIAGNOSIS — A4101 Sepsis due to Methicillin susceptible Staphylococcus aureus: Principal | ICD-10-CM | POA: Diagnosis present

## 2015-11-29 DIAGNOSIS — R6520 Severe sepsis without septic shock: Secondary | ICD-10-CM | POA: Diagnosis present

## 2015-11-29 DIAGNOSIS — Z833 Family history of diabetes mellitus: Secondary | ICD-10-CM

## 2015-11-29 DIAGNOSIS — D649 Anemia, unspecified: Secondary | ICD-10-CM | POA: Diagnosis present

## 2015-11-29 DIAGNOSIS — E876 Hypokalemia: Secondary | ICD-10-CM | POA: Diagnosis present

## 2015-11-29 DIAGNOSIS — R451 Restlessness and agitation: Secondary | ICD-10-CM | POA: Diagnosis present

## 2015-11-29 DIAGNOSIS — F1910 Other psychoactive substance abuse, uncomplicated: Secondary | ICD-10-CM | POA: Diagnosis present

## 2015-11-29 DIAGNOSIS — I2690 Septic pulmonary embolism without acute cor pulmonale: Secondary | ICD-10-CM | POA: Diagnosis present

## 2015-11-29 DIAGNOSIS — F17210 Nicotine dependence, cigarettes, uncomplicated: Secondary | ICD-10-CM | POA: Diagnosis present

## 2015-11-29 DIAGNOSIS — I10 Essential (primary) hypertension: Secondary | ICD-10-CM | POA: Diagnosis present

## 2015-11-29 DIAGNOSIS — E873 Alkalosis: Secondary | ICD-10-CM | POA: Diagnosis not present

## 2015-11-29 DIAGNOSIS — D6489 Other specified anemias: Secondary | ICD-10-CM | POA: Diagnosis present

## 2015-11-29 DIAGNOSIS — Z8249 Family history of ischemic heart disease and other diseases of the circulatory system: Secondary | ICD-10-CM

## 2015-11-29 DIAGNOSIS — I330 Acute and subacute infective endocarditis: Secondary | ICD-10-CM | POA: Diagnosis present

## 2015-11-29 DIAGNOSIS — R7881 Bacteremia: Secondary | ICD-10-CM

## 2015-11-29 DIAGNOSIS — J15211 Pneumonia due to Methicillin susceptible Staphylococcus aureus: Secondary | ICD-10-CM | POA: Diagnosis present

## 2015-11-29 DIAGNOSIS — B957 Other staphylococcus as the cause of diseases classified elsewhere: Secondary | ICD-10-CM | POA: Diagnosis present

## 2015-11-29 DIAGNOSIS — A419 Sepsis, unspecified organism: Secondary | ICD-10-CM | POA: Diagnosis present

## 2015-11-29 DIAGNOSIS — N390 Urinary tract infection, site not specified: Secondary | ICD-10-CM | POA: Diagnosis present

## 2015-11-29 DIAGNOSIS — B9620 Unspecified Escherichia coli [E. coli] as the cause of diseases classified elsewhere: Secondary | ICD-10-CM | POA: Diagnosis present

## 2015-11-29 DIAGNOSIS — D509 Iron deficiency anemia, unspecified: Secondary | ICD-10-CM | POA: Diagnosis present

## 2015-11-29 DIAGNOSIS — G9340 Encephalopathy, unspecified: Secondary | ICD-10-CM | POA: Diagnosis present

## 2015-11-29 DIAGNOSIS — Z820 Family history of epilepsy and other diseases of the nervous system: Secondary | ICD-10-CM

## 2015-11-29 DIAGNOSIS — R4182 Altered mental status, unspecified: Secondary | ICD-10-CM | POA: Diagnosis present

## 2015-11-29 DIAGNOSIS — B182 Chronic viral hepatitis C: Secondary | ICD-10-CM

## 2015-11-29 DIAGNOSIS — J189 Pneumonia, unspecified organism: Secondary | ICD-10-CM | POA: Diagnosis present

## 2015-11-29 DIAGNOSIS — D696 Thrombocytopenia, unspecified: Secondary | ICD-10-CM | POA: Diagnosis present

## 2015-11-29 DIAGNOSIS — B1920 Unspecified viral hepatitis C without hepatic coma: Secondary | ICD-10-CM | POA: Diagnosis present

## 2015-11-29 LAB — CBC WITH DIFFERENTIAL/PLATELET
BASOS ABS: 0 10*3/uL (ref 0.0–0.1)
Basophils Relative: 0 %
EOS ABS: 0 10*3/uL (ref 0.0–0.7)
EOS PCT: 0 %
HCT: 34.9 % — ABNORMAL LOW (ref 36.0–46.0)
HEMOGLOBIN: 11.4 g/dL — AB (ref 12.0–15.0)
LYMPHS ABS: 0.8 10*3/uL (ref 0.7–4.0)
Lymphocytes Relative: 11 %
MCH: 28.6 pg (ref 26.0–34.0)
MCHC: 32.7 g/dL (ref 30.0–36.0)
MCV: 87.7 fL (ref 78.0–100.0)
MONO ABS: 0.6 10*3/uL (ref 0.1–1.0)
Monocytes Relative: 8 %
NEUTROS PCT: 81 %
Neutro Abs: 6 10*3/uL (ref 1.7–7.7)
PLATELETS: 87 10*3/uL — AB (ref 150–400)
RBC: 3.98 MIL/uL (ref 3.87–5.11)
RDW: 13.7 % (ref 11.5–15.5)
WBC: 7.4 10*3/uL (ref 4.0–10.5)

## 2015-11-29 LAB — BASIC METABOLIC PANEL
Anion gap: 11 (ref 5–15)
BUN: 10 mg/dL (ref 6–20)
CALCIUM: 8.1 mg/dL — AB (ref 8.9–10.3)
CHLORIDE: 96 mmol/L — AB (ref 101–111)
CO2: 30 mmol/L (ref 22–32)
CREATININE: 0.48 mg/dL (ref 0.44–1.00)
Glucose, Bld: 147 mg/dL — ABNORMAL HIGH (ref 65–99)
Potassium: 2.9 mmol/L — ABNORMAL LOW (ref 3.5–5.1)
SODIUM: 137 mmol/L (ref 135–145)

## 2015-11-29 LAB — HIV ANTIBODY (ROUTINE TESTING W REFLEX): HIV SCREEN 4TH GENERATION: NONREACTIVE

## 2015-11-29 LAB — URINE MICROSCOPIC-ADD ON

## 2015-11-29 LAB — EXPECTORATED SPUTUM ASSESSMENT W REFEX TO RESP CULTURE

## 2015-11-29 LAB — RAPID URINE DRUG SCREEN, HOSP PERFORMED
AMPHETAMINES: NOT DETECTED
BARBITURATES: NOT DETECTED
BENZODIAZEPINES: POSITIVE — AB
Cocaine: POSITIVE — AB
Opiates: POSITIVE — AB
TETRAHYDROCANNABINOL: NOT DETECTED

## 2015-11-29 LAB — MAGNESIUM: MAGNESIUM: 1.5 mg/dL — AB (ref 1.7–2.4)

## 2015-11-29 LAB — MRSA PCR SCREENING: MRSA BY PCR: NEGATIVE

## 2015-11-29 LAB — IRON AND TIBC
IRON: 6 ug/dL — AB (ref 28–170)
SATURATION RATIOS: 4 % — AB (ref 10.4–31.8)
TIBC: 169 ug/dL — AB (ref 250–450)
UIBC: 163 ug/dL

## 2015-11-29 LAB — PROTIME-INR
INR: 1.12 (ref 0.00–1.49)
PROTHROMBIN TIME: 14.6 s (ref 11.6–15.2)

## 2015-11-29 LAB — FERRITIN: Ferritin: 252 ng/mL (ref 11–307)

## 2015-11-29 LAB — LACTATE DEHYDROGENASE: LDH: 226 U/L — ABNORMAL HIGH (ref 98–192)

## 2015-11-29 LAB — HEPATIC FUNCTION PANEL
ALBUMIN: 2.2 g/dL — AB (ref 3.5–5.0)
ALK PHOS: 160 U/L — AB (ref 38–126)
ALT: 34 U/L (ref 14–54)
AST: 50 U/L — AB (ref 15–41)
BILIRUBIN TOTAL: 0.6 mg/dL (ref 0.3–1.2)
Bilirubin, Direct: 0.4 mg/dL (ref 0.1–0.5)
Indirect Bilirubin: 0.2 mg/dL — ABNORMAL LOW (ref 0.3–0.9)
Total Protein: 5.8 g/dL — ABNORMAL LOW (ref 6.5–8.1)

## 2015-11-29 LAB — LACTIC ACID, PLASMA
Lactic Acid, Venous: 2.6 mmol/L (ref 0.5–2.0)
Lactic Acid, Venous: 1.3 mmol/L (ref 0.5–2.0)

## 2015-11-29 LAB — EXPECTORATED SPUTUM ASSESSMENT W GRAM STAIN, RFLX TO RESP C

## 2015-11-29 LAB — URINALYSIS, ROUTINE W REFLEX MICROSCOPIC
Bilirubin Urine: NEGATIVE
GLUCOSE, UA: NEGATIVE mg/dL
Ketones, ur: NEGATIVE mg/dL
Nitrite: NEGATIVE
PH: 6 (ref 5.0–8.0)
Protein, ur: NEGATIVE mg/dL
SPECIFIC GRAVITY, URINE: 1.015 (ref 1.005–1.030)

## 2015-11-29 LAB — RETICULOCYTES
RBC.: 3.91 MIL/uL (ref 3.87–5.11)
Retic Ct Pct: <0.4 % — ABNORMAL LOW (ref 0.4–3.1)

## 2015-11-29 LAB — STREP PNEUMONIAE URINARY ANTIGEN: Strep Pneumo Urinary Antigen: NEGATIVE

## 2015-11-29 LAB — I-STAT TROPONIN, ED: TROPONIN I, POC: 0.01 ng/mL (ref 0.00–0.08)

## 2015-11-29 MED ORDER — VANCOMYCIN HCL IN DEXTROSE 1-5 GM/200ML-% IV SOLN
1000.0000 mg | Freq: Once | INTRAVENOUS | Status: AC
  Administered 2015-11-29: 1000 mg via INTRAVENOUS
  Filled 2015-11-29: qty 200

## 2015-11-29 MED ORDER — CEFTRIAXONE SODIUM 1 G IJ SOLR: 1.0000 g | Freq: Once | INTRAMUSCULAR | Status: DC

## 2015-11-29 MED ORDER — VANCOMYCIN HCL 500 MG IV SOLR
500.0000 mg | Freq: Three times a day (TID) | INTRAVENOUS | Status: DC
  Administered 2015-11-29 – 2015-11-30 (×5): 500 mg via INTRAVENOUS
  Filled 2015-11-29 (×6): qty 500

## 2015-11-29 MED ORDER — POTASSIUM CHLORIDE 10 MEQ/100ML IV SOLN
10.0000 meq | INTRAVENOUS | Status: AC
  Administered 2015-11-29 (×4): 10 meq via INTRAVENOUS
  Filled 2015-11-29 (×4): qty 100

## 2015-11-29 MED ORDER — IOHEXOL 350 MG/ML SOLN
100.0000 mL | Freq: Once | INTRAVENOUS | Status: AC | PRN
  Administered 2015-11-29: 100 mL via INTRAVENOUS

## 2015-11-29 MED ORDER — SODIUM CHLORIDE 0.9 % IV BOLUS (SEPSIS)
1000.0000 mL | Freq: Once | INTRAVENOUS | Status: AC
  Administered 2015-11-29: 1000 mL via INTRAVENOUS

## 2015-11-29 MED ORDER — POTASSIUM CHLORIDE IN NACL 20-0.9 MEQ/L-% IV SOLN
INTRAVENOUS | Status: DC
  Administered 2015-11-29 – 2015-11-30 (×4): via INTRAVENOUS
  Filled 2015-11-29 (×5): qty 1000

## 2015-11-29 MED ORDER — KETOROLAC TROMETHAMINE 15 MG/ML IJ SOLN
15.0000 mg | Freq: Four times a day (QID) | INTRAMUSCULAR | Status: DC | PRN
  Administered 2015-11-29 (×2): 15 mg via INTRAVENOUS
  Filled 2015-11-29 (×2): qty 1

## 2015-11-29 MED ORDER — SENNOSIDES-DOCUSATE SODIUM 8.6-50 MG PO TABS: 1.0000 | ORAL_TABLET | Freq: Every evening | ORAL | Status: DC | PRN

## 2015-11-29 MED ORDER — MAGNESIUM SULFATE 2 GM/50ML IV SOLN
2.0000 g | Freq: Once | INTRAVENOUS | Status: AC
  Administered 2015-11-29: 2 g via INTRAVENOUS
  Filled 2015-11-29: qty 50

## 2015-11-29 MED ORDER — ACETAMINOPHEN 650 MG RE SUPP
650.0000 mg | Freq: Four times a day (QID) | RECTAL | Status: DC | PRN
Start: 2015-11-29 — End: 2015-12-04
  Administered 2015-11-29: 650 mg via RECTAL
  Filled 2015-11-29: qty 1

## 2015-11-29 MED ORDER — ONDANSETRON HCL 4 MG/2ML IJ SOLN: 4.0000 mg | Freq: Four times a day (QID) | INTRAMUSCULAR | Status: DC | PRN

## 2015-11-29 MED ORDER — ALBUTEROL SULFATE (2.5 MG/3ML) 0.083% IN NEBU
2.5000 mg | INHALATION_SOLUTION | RESPIRATORY_TRACT | Status: DC | PRN
Start: 2015-11-29 — End: 2015-12-04
  Administered 2015-11-29: 2.5 mg via RESPIRATORY_TRACT
  Filled 2015-11-29 (×2): qty 3

## 2015-11-29 MED ORDER — ACETAMINOPHEN 325 MG PO TABS
650.0000 mg | ORAL_TABLET | Freq: Four times a day (QID) | ORAL | Status: DC | PRN
  Administered 2015-12-02: 650 mg via ORAL
  Filled 2015-11-29: qty 2

## 2015-11-29 MED ORDER — DEXTROSE 5 % IV SOLN: 500.0000 mg | INTRAVENOUS | Status: DC

## 2015-11-29 MED ORDER — KETOROLAC TROMETHAMINE 30 MG/ML IJ SOLN
30.0000 mg | Freq: Once | INTRAMUSCULAR | Status: AC
  Administered 2015-11-29: 30 mg via INTRAVENOUS
  Filled 2015-11-29: qty 1

## 2015-11-29 MED ORDER — LORAZEPAM 2 MG/ML IJ SOLN
2.0000 mg | Freq: Once | INTRAMUSCULAR | Status: AC
  Administered 2015-11-29: 2 mg via INTRAVENOUS
  Filled 2015-11-29: qty 1

## 2015-11-29 MED ORDER — CLONIDINE HCL 0.1 MG PO TABS
0.2000 mg | ORAL_TABLET | Freq: Once | ORAL | Status: AC
  Administered 2015-11-29: 0.2 mg via ORAL
  Filled 2015-11-29: qty 2

## 2015-11-29 MED ORDER — LORAZEPAM 2 MG/ML IJ SOLN
0.5000 mg | INTRAMUSCULAR | Status: DC | PRN
  Administered 2015-11-29 (×2): 0.5 mg via INTRAVENOUS
  Filled 2015-11-29 (×2): qty 1

## 2015-11-29 MED ORDER — PIPERACILLIN-TAZOBACTAM 3.375 G IVPB
3.3750 g | Freq: Once | INTRAVENOUS | Status: DC
Start: 2015-11-29 — End: 2015-11-29
  Filled 2015-11-29: qty 50

## 2015-11-29 MED ORDER — METHOCARBAMOL 500 MG PO TABS
500.0000 mg | ORAL_TABLET | Freq: Once | ORAL | Status: AC
  Administered 2015-11-29: 500 mg via ORAL
  Filled 2015-11-29: qty 1

## 2015-11-29 MED ORDER — ONDANSETRON HCL 4 MG PO TABS: 4.0000 mg | ORAL_TABLET | Freq: Four times a day (QID) | ORAL | Status: DC | PRN

## 2015-11-29 MED ORDER — DEXTROSE 5 % IV SOLN
500.0000 mg | Freq: Once | INTRAVENOUS | Status: AC
  Administered 2015-11-29: 500 mg via INTRAVENOUS
  Filled 2015-11-29: qty 500

## 2015-11-29 MED ORDER — LORAZEPAM 2 MG/ML IJ SOLN
1.0000 mg | Freq: Once | INTRAMUSCULAR | Status: AC
  Administered 2015-11-29: 1 mg via INTRAVENOUS
  Filled 2015-11-29: qty 1

## 2015-11-29 MED ORDER — DEXTROSE 5 % IV SOLN
2.0000 g | Freq: Every day | INTRAVENOUS | Status: DC
  Administered 2015-11-29: 2 g via INTRAVENOUS
  Filled 2015-11-29 (×2): qty 2

## 2015-11-29 NOTE — Progress Notes (Signed)
Pharmacy Antibiotic Note  Madeline Andrade is a 25 y.o. female admitted on 11/29/2015 with pneumonia.  Pharmacy has been consulted for azithromycin, ceftriaxone dosing.  Plan: Azithromycin 500mg  iv q24hr Ceftriaxone 2gm iv q24hr     Temp (24hrs), Avg:98.7 F (37.1 C), Min:98.7 F (37.1 C), Max:98.7 F (37.1 C)   Recent Labs Lab 11/29/15 0357  WBC 7.4  CREATININE 0.48    CrCl cannot be calculated (Unknown ideal weight.).    No Known Allergies  Antimicrobials this admission: Azithromycin 3/20 >>  Ceftriaxone 3/20 >>   Thank you for allowing pharmacy to be a part of this patient's care.  Aleene DavidsonGrimsley Jr, Braniya Farrugia Crowford 11/29/2015 6:26 AM

## 2015-11-29 NOTE — ED Notes (Signed)
Pt continuous yelling and tearful; Campos at bedside; see MAR for intervention.

## 2015-11-29 NOTE — ED Notes (Signed)
2nd potassium infusion complete; 3rd potassium infusing at present time.

## 2015-11-29 NOTE — Progress Notes (Signed)
Pharmacy Antibiotic Note  Madeline MimesLori Kaye Andrade is a 25 y.o. female admitted on 11/29/2015 with pneumonia.  Pharmacy was consulted for azithromycin, ceftriaxone dosing. Adding Vancomycin per Pharmacy for rule out endocarditis, chest CT: neg for PE; consolidation noted-probably infectious, possible septic emboli &/or PNA, atypical infection. Urine drug screen positive for opiates, cocaine, benzo's  Plan: Azithromycin 500mg  iv q24hr Ceftriaxone 2gm iv q24hr Vancomycin 1gm x1 in ED followed by 500mg  q8hr, goal trough 15-20 mcg/ml  Height: 5\' 7"  (170.2 cm) Weight: 120 lb (54.432 kg) IBW/kg (Calculated) : 61.6  Temp (24hrs), Avg:98.8 F (37.1 C), Min:98.7 F (37.1 C), Max:98.8 F (37.1 C)   Recent Labs Lab 11/29/15 0357  WBC 7.4  CREATININE 0.48    Estimated Creatinine Clearance: 90.7 mL/min (by C-G formula based on Cr of 0.48).    No Known Allergies  Antimicrobials this admission: Azithromycin 3/20 >>  Ceftriaxone 3/20 >> Vancomycin 3/20 >>  Dose adjustments this admission:  Microbiology results: 3/20 BCx: sent 3/20 UCx: sent, dirty U/A 3/20 Strep pneumo Ag:   Thank you for allowing pharmacy to be a part of this patient's care.  Madeline Andrade, Madeline Andrade PharmD Pager [phone] 11/29/2015, 8:09 AM

## 2015-11-29 NOTE — Progress Notes (Signed)
Patient Demographics  Madeline Andrade, is a 25 y.o. female, DOB - November 24, 1990, ZOX:[id number]  Admit date - 11/29/2015   Admitting Physician Alberteen Sam, MD  Outpatient Primary MD for the patient is No PCP Per Patient  LOS - 0   Chief Complaint  Patient presents with  . Shortness of Breath       Admission HPI/Brief narrative: Patient was admitted earlier during the day by Dr. Marylene Land, charts, labs, imaging reviewed.  Subjective:   Madeline Andrade today has, No headache, No chest pain, No abdominal pain - No Nausea,  Assessment & Plan    Principal Problem:   Sepsis (HCC) Active Problems:   Hepatitis C   Community acquired pneumonia   Thrombocytopenia (HCC)   Hypokalemia   Anemia   Heroin abuse  septic secondary to pulmonary emboli versus pneumonia:  - Patient meets sepsis criteria given tachycardia, tachypnea, fever, and evidence of organ dysfunction (altered mental status). - Continue with Rocephin and azithromycin to cover for community-acquired pneumonia, continue with IV vancomycin as high suspicion for drug abuse . - Follow on blood cultures. - Acetaminophen and ketorolac when necessary for pain - Check LFTs - If blood cultures positive, we'll consult infectious disease in order echocardiogram   Hypokalemia/minus anemia - Repleted, recheck in a.m.  Thrombocytopenia:  - No evidence of active bleed, monitor closely  Anemia - Follow on anemia panel  Poly-Substance abuse  Code Status: Full  Family Communication: none At bedside  Disposition Plan: Return in stepdown   Procedures  none   Consults   none   Medications  Scheduled Meds: . [START ON 11/30/2015] azithromycin  500 mg Intravenous Q24H  . cefTRIAXone (ROCEPHIN)  IV  2 g Intravenous Q0600  . vancomycin  500 mg Intravenous Q8H   Continuous Infusions: . 0.9 % NaCl with KCl 20 mEq / L 100 mL/hr at  11/29/15 0929   PRN Meds:.acetaminophen **OR** acetaminophen, albuterol, ketorolac, LORazepam, ondansetron **OR** ondansetron (ZOFRAN) IV, senna-docusate  DVT Prophylaxis   SCDs   Lab Results  Component Value Date   PLT 87* 11/29/2015    Antibiotics    Anti-infectives    Start     Dose/Rate Route Frequency Ordered Stop   11/30/15 0900  azithromycin (ZITHROMAX) 500 mg in dextrose 5 % 250 mL IVPB     500 mg 250 mL/hr over 60 Minutes Intravenous Every 24 hours 11/29/15 1351     11/30/15 0800  azithromycin (ZITHROMAX) 500 mg in dextrose 5 % 250 mL IVPB  Status:  Discontinued     500 mg 250 mL/hr over 60 Minutes Intravenous Every 24 hours 11/29/15 0624 11/29/15 1351   11/29/15 1400  vancomycin (VANCOCIN) 500 mg in sodium chloride 0.9 % 100 mL IVPB     500 mg 100 mL/hr over 60 Minutes Intravenous Every 8 hours 11/29/15 0754     11/29/15 0630  cefTRIAXone (ROCEPHIN) 1 g in dextrose 5 % 50 mL IVPB  Status:  Discontinued     1 g 100 mL/hr over 30 Minutes Intravenous  Once 11/29/15 0618 11/29/15 0624   11/29/15 0630  azithromycin (ZITHROMAX) 500 mg in dextrose 5 % 250 mL IVPB     500 mg 250 mL/hr over 60 Minutes Intravenous  Once 11/29/15 0618 11/29/15 0805   11/29/15 0630  cefTRIAXone (ROCEPHIN) 2 g in dextrose 5 % 50 mL IVPB     2 g 100 mL/hr over 30 Minutes Intravenous Daily 11/29/15 0624     11/29/15 0545  vancomycin (VANCOCIN) IVPB 1000 mg/200 mL premix     1,000 mg 200 mL/hr over 60 Minutes Intravenous  Once 11/29/15 0538 11/29/15 0700   11/29/15 0545  piperacillin-tazobactam (ZOSYN) IVPB 3.375 g  Status:  Discontinued     3.375 g 12.5 mL/hr over 240 Minutes Intravenous  Once 11/29/15 0538 11/29/15 0552          Objective:   Filed Vitals:   11/29/15 1130 11/29/15 1155 11/29/15 1200 11/29/15 1600  BP: 134/86 103/66 104/68   Pulse: 124 115 113   Temp: 100.1 F (37.8 C)   97.6 F (36.4 C)  TempSrc: Rectal   Oral  Resp: 32 28    Height:      Weight:      SpO2: 91%  96% 97%     Wt Readings from Last 3 Encounters:  11/29/15 54.432 kg (120 lb)  08/27/15 57.244 kg (126 lb 3.2 oz)  02/19/15 59.421 kg (131 lb)     Intake/Output Summary (Last 24 hours) at 11/29/15 1703 Last data filed at 11/29/15 1502  Gross per 24 hour  Intake      0 ml  Output   1100 ml  Net  -1100 ml     Physical Exam  Clinical data comfortably [place].AT,PERRAL Supple Neck,No JVD, No cervical lymphadenopathy appriciated.  Symmetrical Chest wall movement, Good air movement bilaterally, CTAB RRR,No Gallops,Rubs or new Murmurs, No Parasternal Heave +ve B.Sounds, Abd Soft, No tenderness, No organomegaly appriciated, No rebound - guarding or rigidity. No Cyanosis, Clubbing or edema, No new Rash or bruise     Data Review   Micro Results Recent Results (from the past 240 hour(s))  Culture, sputum-assessment     Status: None   Collection Time: 11/29/15  8:22 AM  Result Value Ref Range Status   Specimen Description SPUTUM  Final   Special Requests NONE  Final   Sputum evaluation   Final    THIS SPECIMEN IS ACCEPTABLE. RESPIRATORY CULTURE REPORT TO FOLLOW.   Report Status 11/29/2015 FINAL  Final  MRSA PCR Screening     Status: None   Collection Time: 11/29/15 11:41 AM  Result Value Ref Range Status   MRSA by PCR NEGATIVE NEGATIVE Final    Comment:        The GeneXpert MRSA Assay (FDA approved for NASAL specimens only), is one component of a comprehensive MRSA colonization surveillance program. It is not intended to diagnose MRSA infection nor to guide or monitor treatment for MRSA infections.     Radiology Reports Ct Angio Chest Pe W/cm &/or Wo Cm  11/29/2015  CLINICAL DATA:  Dyspnea.  Hematemesis. EXAM: CT ANGIOGRAPHY CHEST WITH CONTRAST TECHNIQUE: Multidetector CT imaging of the chest was performed using the standard protocol during bolus administration of intravenous contrast. Multiplanar CT image reconstructions and MIPs were obtained to evaluate the vascular  anatomy. CONTRAST:  OMNIPAQUE IOHEXOL 350 MG/ML SOLN COMPARISON:  CT 01/04/2015 FINDINGS: There is good opacification of the pulmonary vasculature. There is no pulmonary embolism. The thoracic aorta is normal in caliber and intact. Review of the MIP images confirms the above findings. There are numerous peripheral nodules and patchy consolidation throughout both lungs, with some predominance in the lower lobes. Some confluence of peripheral  opacities in the Andrade lower lobe posteriorly. No cavitation. Symmetric hilar adenopathy.  Nonspecific mediastinal nodes. No pleural effusions. No significant skeletal lesion. IMPRESSION: 1. Negative for acute pulmonary embolism. 2. Numerous peripheral nodules and patchy consolidation throughout both lungs. This likely is infectious. Septic emboli is a consideration. Atypical infection is a consideration. Neoplasm is unlikely. Electronically Signed   By: Ellery Plunkaniel R Mitchell M.D.   On: 11/29/2015 05:09     CBC  Recent Labs Lab 11/29/15 0357  WBC 7.4  HGB 11.4*  HCT 34.9*  PLT 87*  MCV 87.7  MCH 28.6  MCHC 32.7  RDW 13.7  LYMPHSABS 0.8  MONOABS 0.6  EOSABS 0.0  BASOSABS 0.0    Chemistries   Recent Labs Lab 11/29/15 0357 11/29/15 0842  NA 137  --   K 2.9*  --   CL 96*  --   CO2 30  --   GLUCOSE 147*  --   BUN 10  --   CREATININE 0.48  --   CALCIUM 8.1*  --   MG  --  1.5*  AST  --  50*  ALT  --  34  ALKPHOS  --  160*  BILITOT  --  0.6   ------------------------------------------------------------------------------------------------------------------ estimated creatinine clearance is 90.7 mL/min (by C-G formula based on Cr of 0.48). ------------------------------------------------------------------------------------------------------------------ No results for input(s): HGBA1C in the last 72 hours. ------------------------------------------------------------------------------------------------------------------ No results for input(s):  CHOL, HDL, LDLCALC, TRIG, CHOLHDL, LDLDIRECT in the last 72 hours. ------------------------------------------------------------------------------------------------------------------ No results for input(s): TSH, T4TOTAL, T3FREE, THYROIDAB in the last 72 hours.  Invalid input(s): FREET3 ------------------------------------------------------------------------------------------------------------------  Recent Labs  11/29/15 0807 11/29/15 0842  FERRITIN  --  252  TIBC  --  169*  IRON  --  6*  RETICCTPCT <0.4*  --     Coagulation profile  Recent Labs Lab 11/29/15 0807  INR 1.12    No results for input(s): DDIMER in the last 72 hours.  Cardiac Enzymes No results for input(s): CKMB, TROPONINI, MYOGLOBIN in the last 168 hours.  Invalid input(s): CK ------------------------------------------------------------------------------------------------------------------ Invalid input(s): POCBNP     Time Spent in minutes  No charge   Randol KernELGERGAWY, Keyia Moretto M.D on 11/29/2015 at 5:03 PM  Between 7am to 7pm - Pager - [phone]  After 7pm go to [email or web address] - password [email or web address]  [facility]   Office  [phone]

## 2015-11-29 NOTE — ED Provider Notes (Signed)
CSN: [id number]     Arrival date & time 11/29/15  0130 History   By signing my name below, I, Marisue Humble, attest that this documentation has been prepared under the direction and in the presence of Geoffery Lyons, MD. Electronically Signed: Marisue Humble, Scribe. 11/29/2015. 3:19 AM.   Chief Complaint  Patient presents with  . Shortness of Breath    The history is provided by the patient. No language interpreter was used.   HPI Comments:  Madeline Andrade is a 25 y.o. female with PMHx of heroin abuse, hepatitis C, and gall bladder disease who presents to the Emergency Department via EMS complaining of persistent shortness of breath onset earlier tonight. Pt reports associated moderate-severe left side rib pain for the past 3 days, bruising on legs, and productive bloody cough. She states she is in extreme pain. No alleviating or exacerbating factors noted.   Past Medical History  Diagnosis Date  . Hepatitis C   . Gall bladder disease   . Heroin abuse    Past Surgical History  Procedure Laterality Date  . Cesarean section     No family history on file. Social History  Substance Use Topics  . Smoking status: Current Every Day Smoker -- 1.00 packs/day    Types: Cigarettes  . Smokeless tobacco: None  . Alcohol Use: Yes     Comment: wine occasional   OB History    No data available     Review of Systems  Respiratory: Positive for cough and shortness of breath.   Musculoskeletal: Positive for arthralgias (left ribs).  Skin: Positive for color change (bruising).  All other systems reviewed and are negative.  Allergies  Review of patient's allergies indicates no known allergies.  Home Medications   Prior to Admission medications   Medication Sig Start Date End Date Taking? Authorizing Provider  amoxicillin-clavulanate (AUGMENTIN) 875-125 MG tablet Take 1 tablet by mouth 2 (two) times daily. 08/27/15   Peyton Najjar, MD  HYDROcodone-acetaminophen (NORCO) 5-325 MG per  tablet Take 1 tablet by mouth every 6 (six) hours as needed for severe pain. Patient not taking: Reported on 08/27/2015 01/04/15   Loma Boston, MD  ibuprofen (ADVIL,MOTRIN) 600 MG tablet Take 1 tablet (600 mg total) by mouth every 6 (six) hours as needed. Patient not taking: Reported on 08/27/2015 01/04/15   Loma Boston, MD  naproxen (NAPROSYN) 500 MG tablet Take 1 tablet (500 mg total) by mouth 2 (two) times daily as needed for mild pain, moderate pain or headache (TAKE WITH MEALS.). Patient not taking: Reported on 01/03/2015 07/29/14   Mercedes Camprubi-Soms, PA-C   BP 129/82 mmHg  Pulse 133  Temp(Src) 98.7 F (37.1 C) (Oral)  Resp 25  SpO2 100% Physical Exam  Constitutional: She appears well-developed and well-nourished.  Pt is sobbing, moaning, and is extremely dramatic.   HENT:  Head: Normocephalic and atraumatic.  Mouth/Throat: Oropharynx is clear and moist. No oropharyngeal exudate.  Eyes: Conjunctivae and EOM are normal. Pupils are equal, round, and reactive to light. Right eye exhibits no discharge. Left eye exhibits no discharge. No scleral icterus.  Neck: Normal range of motion. Neck supple. No JVD present. No thyromegaly present.  Cardiovascular: Normal rate, regular rhythm, normal heart sounds and intact distal pulses.  Exam reveals no gallop and no friction rub.   No murmur heard. Pulmonary/Chest: Effort normal and breath sounds normal. No respiratory distress. She has no wheezes. She has no rales. She exhibits tenderness.  There is exquisite TTP over  the left and right posterior chest wall. There is no palpable or visible abnormality.   Abdominal: Soft. Bowel sounds are normal. She exhibits no distension and no mass. There is no tenderness.  Musculoskeletal: Normal range of motion. She exhibits no edema or tenderness.  Lymphadenopathy:    She has no cervical adenopathy.  Neurological: She is alert. Coordination normal.  Skin: Skin is warm and dry. No rash noted. No  erythema.  Psychiatric: She has a normal mood and affect. Her behavior is normal.  Nursing note and vitals reviewed.  ED Course  Procedures  DIAGNOSTIC STUDIES:  Oxygen Saturation is 100% on RA, normal by my interpretation.    COORDINATION OF CARE:  3:13 AM Discussed treatment plan with pt at bedside and pt agreed to plan.  Labs Review Labs Reviewed  BASIC METABOLIC PANEL  CBC WITH DIFFERENTIAL/PLATELET  I-STAT TROPOININ, ED    Imaging Review No results found. I have personally reviewed and evaluated these images and lab results as part of my medical decision-making.    MDM   Final diagnoses:  None    Patient is a 25 year old female with history of IV drug abuse. She presents for evaluation of chest pain and difficulty breathing. This started within the past 1-2 days. The patient was very dramatic upon presentation. She was moaning, hyperventilating, and carrying on quite dramatically. Workup was initiated including CBC, metabolic panel, and troponin. These were all negative. I also obtained a CT angios of the chest to rule out pulmonary embolism. There is no evidence for PE, however this did reveal numerous peripheral nodules and patchy consolidation which is most likely infectious in nature. Due to her history of IV drug abuse, septic emboli from possible endocarditis are a distinct possibility.   Although she was not febrile and there is no leukocytosis, blood cultures were obtained and orders for vancomycin were placed. I've discussed the case with Dr. Maryfrances Bunnellanford from the hospitalist service who will admit the patient.     Geoffery Lyonsouglas Jefferey Lippmann, MD 11/29/15 [phone]

## 2015-11-29 NOTE — Progress Notes (Signed)
CRITICAL VALUE ALERT  Critical value received:  Positive Blood Cultures:  Gm+ Cocci and Clusters in all four bottles  Date of notification:  11/29/2015  Time of notification:  2028  Critical value read back:Yes.    Nurse who received alert:  Vernon PreyHans Krista Som,RN  MD notified (1st page):  2028  Time of first page:  2028  MD notified (2nd page):  Time of second page:  Responding MD:  Devonne DoughtyKaren Kirby,NP  Time MD responded:

## 2015-11-29 NOTE — ED Notes (Signed)
Pt hyperventilating and reports anxious; see MAR for intervention.

## 2015-11-29 NOTE — ED Notes (Signed)
Pt continues to yell out despite staff intervention. Provider notified of pt continued reported anxiety post nebulizer treatment; see MAR for intervention.

## 2015-11-29 NOTE — ED Notes (Signed)
Adequate blood sample collected to draw lactic and protime INR as ordered; other staff will reattempt collecting other orders. Per Shelia in main lab with run reticulocyte count from blood already in main lab.

## 2015-11-29 NOTE — ED Notes (Signed)
Enter PT room PT at bottom of bed with everything off, stated she ready to go need everything off. Pt was place back in bed with grown place back on

## 2015-11-29 NOTE — ED Notes (Signed)
Upon entering room to reassess pt pt found resting in bed; pt awakes with entering states pain still "worse ever." Hospitalist at bedside; aware of pt status and complaint.

## 2015-11-29 NOTE — ED Notes (Signed)
Pt heard screaming "get this gown off me.  I'm ready to go home."  When staff entered room, Pt found sitting naked at the end of the bed.  Pt taken off cardiac monitoring, BP cuff, and pulse ox.  Staff assisted Pt back into bed and replaced all monitoring equipment.  Call light in place.

## 2015-11-29 NOTE — Progress Notes (Signed)
Pt's father Jorge NyLarry Stover given updates on the pt's condition. Mr. Marylene LandStover states that he was contacted by the pt this am and was told the the pt was in the emergency room, but prior to that call,  he states he has not talked to his daughter in about 2 years.Phone given to the pt to speak with her father, but the pt is unable to hold a conversation due to drowsiness. Pt's father states that the pt has been estranged from all family members due to drug use. Pt's father, Jorge NyLarry Stover, can be contacted at [phone].

## 2015-11-29 NOTE — ED Notes (Signed)
Blood work have been collected

## 2015-11-29 NOTE — ED Notes (Signed)
Pt overheard stating "I'm going to smoke a cigarette, so I can be kicked out."

## 2015-11-29 NOTE — H&P (Signed)
History and Physical  Patient Name: Madeline Andrade     ZOX:[id number]    DOB: 06-Mar-1991    DOA: 11/29/2015 Referring physician: Emily Filbert, MD PCP: No PCP Per Patient      Chief Complaint: Chest pain and cough  HPI: Madeline Andrade is a 25 y.o. female with a past medical history significant for heroin abuse who presents with chest pain and cough.    History collected from the patient is very limited, as the patient is lethargic. The patient started drug rehabilitation about 1 month ago and was discharged about a week ago, and has been living with friends since then, and using again.  She says that for the last week she has been having worsening chest pain, beginning in the lower ribs in front, and spreading until it was all over her chest and back. It is associated with sweats, chills, and productive cough, and worsening, such that night she called EMS.  In the ED, the patient was tachycardic and tachypneic and writhing in pain. Blood pressure was 130/80, and she was saturating well on ambient air.  Na 137, K 2.9, creatinine 0.5, WBC 7.4K, Hgb 11.4 (normocytic and new since 1 month ago), platelets 87K (also new since last month).  A CT angiogram of the chest was obtained, which showed numerous nodular opacities in bilateral lungs, mostly in the periphery, consistent with infection or septic emboli, so blood cultures were obtained, and TRH were asked to evaluate for admission.     Review of Systems:  Pt complains of pain, shortness of breath, productive cough, wheezing, sweats, chills, malaise. All other systems negative except as just noted or noted in the history of present illness, although review of systems Limited by patient's lethargy.  No Known Allergies  Prior to Admission medications   None    Past Medical History  Diagnosis Date  . Hepatitis C   . Gall bladder disease   . Heroin abuse     Past Surgical History  Procedure Laterality Date  . Cesarean section       Family history: family history includes Diabetes Mellitus I in her father; Hypertension in her father; Multiple sclerosis in her mother.  Social History: Patient lives with friends currently, since getting out of rehabilitation recently. She smokes. She reports using since being out of rehabilitation.       Physical Exam: BP 129/82 mmHg  Pulse 133  Temp(Src) 98.7 F (37.1 C) (Oral)  Resp 25  SpO2 100% General appearance: Cachectic adult female, somnolent, arousable to noxious stimuli, able to answer questions, oriented to "hospital", but thinks that she is in [place].   Eyes: Anicteric, conjunctiva injected, lids and lashes normal.     ENT: No nasal deformity, discharge, or epistaxis.  OP moist without lesions.   Lymph: No cervical or supraclavicular lymphadenopathy. Skin: Warm and sweaty.  No jaundice.  No suspicious rashes or lesions on the hands/palms, feet. Cardiac: Tachycardic, regular, nl S1-S2, no murmurs appreciated.  Capillary refill is brisk.  JVP normal.  No LE edema.  Radial and DP pulses 2+ and symmetric. Respiratory: Tachypneic, poor respiratory effort. No wheezes appreciated. Abdomen: Abdomen soft without rigidity.  Mild nonfocal TTP, no masses appreciated . No ascites, distension.   MSK: No deformities or effusions. Neuro: lethargic, arouses to noxious stimuli, globally weak. Speech is fluent.  Psych:  Unable to assess.    Labs on Admission:  The metabolic panel shows hypokalemia, normal renal function. The complete blood count shows new  normocytic anemia, new thrombocytopenia, no leukocytosis.  Troponin negative. Blood cultures pending.   Radiological Exams on Admission: Personally reviewed: Ct Angio Chest Pe W/cm &/or Wo Cm  11/29/2015  CLINICAL DATA:  Dyspnea.  Hematemesis. EXAM: CT ANGIOGRAPHY CHEST WITH CONTRAST TECHNIQUE: Multidetector CT imaging of the chest was performed using the standard protocol during bolus administration of intravenous  contrast. Multiplanar CT image reconstructions and MIPs were obtained to evaluate the vascular anatomy. CONTRAST:  100mL OMNIPAQUE IOHEXOL 350 MG/ML SOLN COMPARISON:  CT 01/04/2015 FINDINGS: There is good opacification of the pulmonary vasculature. There is no pulmonary embolism. The thoracic aorta is normal in caliber and intact. Review of the MIP images confirms the above findings. There are numerous peripheral nodules and patchy consolidation throughout both lungs, with some predominance in the lower lobes. Some confluence of peripheral opacities in the left lower lobe posteriorly. No cavitation. Symmetric hilar adenopathy.  Nonspecific mediastinal nodes. No pleural effusions. No significant skeletal lesion. IMPRESSION: 1. Negative for acute pulmonary embolism. 2. Numerous peripheral nodules and patchy consolidation throughout both lungs. This likely is infectious. Septic emboli is a consideration. Atypical infection is a consideration. Neoplasm is unlikely. Electronically Signed   By: Ellery Plunkaniel R Mitchell M.D.   On: 11/29/2015 05:09    EKG: Independently reviewed. Sinus rhythm, rate 116, QTc normal.  No ST or T wave changes.    Assessment/Plan 1. Possible sepsis, septic emboli versus pneumonia:  This is new.  Suspected source pneumonia or septic emboli. Organism unknown. Patient meets criteria given tachycardia, tachypnea, fever, and evidence of organ dysfunction (altered mental status).  Lactate pending.  This patient is at high risk of poor outcomes with a SOFA score of 2 (respiratory rate of 22/min or greater, altered mentation, or systolic blood pressure of 100 mm Hg or less).  Antibiotics delivered in the ED.    -Sepsis bundle utilized:  -Blood and urine cultures drawn  -30 ml/kg bolus given in ED, will repeat lactic acid  -Start targeted antibiotics with vancomycin, ceftriaxone, and azithromycin, based on suspected source of infection (intend to cover for staph bacteremia).  -Repeat renal  function and complete blood count in AM  -Code SEPSIS called to E-link -Check MRSA swab, and de-escalate vancomycin is able -Check urinalysis and urine culture -Acetaminophen and ketorolac when necessary for pain -Check LFTs -If blood cultures positive, we'll consult infectious disease in order echocardiogram   2. Hypokalemia:  -Check magnesium -Supplement now and repeat  3. Thrombocytopenia:  -Check coags -Trend CBC  4. Anemia:  Unclear etiology. Rule out shearing/destruction. -Check LDH and LFTs -Check reticulocytes -Check iron studies    DVT PPx: SCDs Diet: Regular Consultants: None Code Status: Full Family Communication: None present  Medical decision making: What exists of the patient's previous chart and CareEverywherewas reviewed in depth and the case was discussed with Dr. Tami Ribasillow. Patient seen 6:31 AM on 11/29/2015.  Disposition Plan:  I recommend admission to stepdown given altered mentation.  Clinical condition: guarded.  Anticipate IV antibiotics, fluids and follow culture data.  De-escalate as able.  If bacteremia, will rule out endocarditis.      Alberteen SamChristopher P Danford [facility] Pager [phone]

## 2015-11-29 NOTE — ED Notes (Signed)
Per EMS pt is c/o shortness of breath and productive cough  Pt had some wheezing upon EMS arrival  Albuterol 5mg  neb given  Pt has hx of substance abuse

## 2015-11-30 ENCOUNTER — Inpatient Hospital Stay (HOSPITAL_COMMUNITY): Payer: Self-pay

## 2015-11-30 DIAGNOSIS — F1410 Cocaine abuse, uncomplicated: Secondary | ICD-10-CM

## 2015-11-30 DIAGNOSIS — R7881 Bacteremia: Secondary | ICD-10-CM

## 2015-11-30 DIAGNOSIS — I369 Nonrheumatic tricuspid valve disorder, unspecified: Secondary | ICD-10-CM

## 2015-11-30 DIAGNOSIS — F1110 Opioid abuse, uncomplicated: Secondary | ICD-10-CM

## 2015-11-30 DIAGNOSIS — R451 Restlessness and agitation: Secondary | ICD-10-CM

## 2015-11-30 DIAGNOSIS — I2690 Septic pulmonary embolism without acute cor pulmonale: Secondary | ICD-10-CM

## 2015-11-30 DIAGNOSIS — F1310 Sedative, hypnotic or anxiolytic abuse, uncomplicated: Secondary | ICD-10-CM

## 2015-11-30 DIAGNOSIS — B1920 Unspecified viral hepatitis C without hepatic coma: Secondary | ICD-10-CM

## 2015-11-30 DIAGNOSIS — E43 Unspecified severe protein-calorie malnutrition: Secondary | ICD-10-CM

## 2015-11-30 DIAGNOSIS — J189 Pneumonia, unspecified organism: Secondary | ICD-10-CM

## 2015-11-30 DIAGNOSIS — A4101 Sepsis due to Methicillin susceptible Staphylococcus aureus: Principal | ICD-10-CM

## 2015-11-30 LAB — CBC
HEMATOCRIT: 25.8 % — AB (ref 36.0–46.0)
HEMOGLOBIN: 8.8 g/dL — AB (ref 12.0–15.0)
MCH: 28.3 pg (ref 26.0–34.0)
MCHC: 34.1 g/dL (ref 30.0–36.0)
MCV: 83 fL (ref 78.0–100.0)
Platelets: 117 10*3/uL — ABNORMAL LOW (ref 150–400)
RBC: 3.11 MIL/uL — ABNORMAL LOW (ref 3.87–5.11)
RDW: 13.8 % (ref 11.5–15.5)
WBC: 9.5 10*3/uL (ref 4.0–10.5)

## 2015-11-30 LAB — COMPREHENSIVE METABOLIC PANEL
ALBUMIN: 2 g/dL — AB (ref 3.5–5.0)
ALT: 30 U/L (ref 14–54)
ANION GAP: 8 (ref 5–15)
AST: 33 U/L (ref 15–41)
Alkaline Phosphatase: 144 U/L — ABNORMAL HIGH (ref 38–126)
BUN: 11 mg/dL (ref 6–20)
CHLORIDE: 108 mmol/L (ref 101–111)
CO2: 24 mmol/L (ref 22–32)
Calcium: 7.4 mg/dL — ABNORMAL LOW (ref 8.9–10.3)
Creatinine, Ser: 0.46 mg/dL (ref 0.44–1.00)
GFR calc Af Amer: >60 mL/min (ref >60)
GFR calc non Af Amer: >60 mL/min (ref >60)
GLUCOSE: 129 mg/dL — AB (ref 65–99)
POTASSIUM: 3.3 mmol/L — AB (ref 3.5–5.1)
SODIUM: 140 mmol/L (ref 135–145)
Total Bilirubin: 0.7 mg/dL (ref 0.3–1.2)
Total Protein: 5.4 g/dL — ABNORMAL LOW (ref 6.5–8.1)

## 2015-11-30 LAB — BLOOD GAS, ARTERIAL
Acid-base deficit: 0.2 mmol/L (ref 0.0–2.0)
Bicarbonate: 21.6 mEq/L (ref 20.0–24.0)
Drawn by: 441261
O2 Saturation: 94.6 %
PCO2 ART: 25.9 mmHg — AB (ref 35.0–45.0)
PH ART: 7.532 — AB (ref 7.350–7.450)
Patient temperature: 98.6
TCO2: 20.1 mmol/L (ref 0–100)
pO2, Arterial: 72.1 mmHg — ABNORMAL LOW (ref 80.0–100.0)

## 2015-11-30 LAB — ECHOCARDIOGRAM COMPLETE
HEIGHTINCHES: 67 in
Weight: 1920 oz

## 2015-11-30 LAB — VANCOMYCIN, TROUGH: Vancomycin Tr: 4 ug/mL — ABNORMAL LOW (ref 10.0–20.0)

## 2015-11-30 LAB — LACTIC ACID, PLASMA: Lactic Acid, Venous: 2.3 mmol/L (ref 0.5–2.0)

## 2015-11-30 LAB — PROTIME-INR
INR: 1.11 (ref 0.00–1.49)
Prothrombin Time: 14.5 seconds (ref 11.6–15.2)

## 2015-11-30 LAB — MAGNESIUM: Magnesium: 1.7 mg/dL (ref 1.7–2.4)

## 2015-11-30 MED ORDER — DEXMEDETOMIDINE HCL IN NACL 200 MCG/50ML IV SOLN
0.4000 ug/kg/h | INTRAVENOUS | Status: DC
  Administered 2015-11-30: 0.4 ug/kg/h via INTRAVENOUS
  Administered 2015-11-30: 0.8 ug/kg/h via INTRAVENOUS
  Administered 2015-11-30 – 2015-12-01 (×4): 1.2 ug/kg/h via INTRAVENOUS
  Filled 2015-11-30 (×7): qty 50

## 2015-11-30 MED ORDER — PIPERACILLIN-TAZOBACTAM 3.375 G IVPB
3.3750 g | Freq: Three times a day (TID) | INTRAVENOUS | Status: DC
  Administered 2015-11-30 – 2015-12-01 (×5): 3.375 g via INTRAVENOUS
  Filled 2015-11-30 (×4): qty 50

## 2015-11-30 MED ORDER — LORAZEPAM 2 MG/ML IJ SOLN
INTRAMUSCULAR | Status: AC
  Filled 2015-11-30: qty 1

## 2015-11-30 MED ORDER — LORAZEPAM 2 MG/ML IJ SOLN
0.5000 mg | Freq: Once | INTRAMUSCULAR | Status: AC
  Administered 2015-11-30: 0.5 mg via INTRAVENOUS
  Filled 2015-11-30: qty 1

## 2015-11-30 MED ORDER — LORAZEPAM 2 MG/ML IJ SOLN
1.0000 mg | INTRAMUSCULAR | Status: DC | PRN
  Administered 2015-11-30: 2 mg via INTRAVENOUS
  Filled 2015-11-30: qty 1

## 2015-11-30 MED ORDER — MORPHINE SULFATE (PF) 2 MG/ML IV SOLN
1.0000 mg | INTRAVENOUS | Status: DC | PRN
  Administered 2015-11-30: 3 mg via INTRAVENOUS
  Administered 2015-11-30: 2 mg via INTRAVENOUS
  Administered 2015-11-30: 3 mg via INTRAVENOUS
  Administered 2015-12-01: 2 mg via INTRAVENOUS
  Administered 2015-12-01 (×3): 1 mg via INTRAVENOUS
  Filled 2015-11-30: qty 2
  Filled 2015-11-30 (×3): qty 1
  Filled 2015-11-30: qty 2
  Filled 2015-11-30 (×2): qty 1

## 2015-11-30 MED ORDER — LORAZEPAM 2 MG/ML IJ SOLN
2.0000 mg | Freq: Once | INTRAMUSCULAR | Status: AC
  Administered 2015-11-30: 2 mg via INTRAVENOUS

## 2015-11-30 MED ORDER — POTASSIUM CHLORIDE CRYS ER 20 MEQ PO TBCR
40.0000 meq | EXTENDED_RELEASE_TABLET | Freq: Once | ORAL | Status: AC
  Administered 2015-11-30: 40 meq via ORAL
  Filled 2015-11-30: qty 2

## 2015-11-30 MED ORDER — LORAZEPAM 2 MG/ML IJ SOLN
1.0000 mg | INTRAMUSCULAR | Status: DC | PRN
  Administered 2015-11-30: 1 mg via INTRAVENOUS
  Filled 2015-11-30: qty 1

## 2015-11-30 MED ORDER — CLONIDINE HCL 0.2 MG/24HR TD PTWK
0.2000 mg | MEDICATED_PATCH | TRANSDERMAL | Status: DC
  Administered 2015-11-30: 0.2 mg via TRANSDERMAL
  Filled 2015-11-30: qty 1

## 2015-11-30 MED ORDER — NICOTINE 21 MG/24HR TD PT24
21.0000 mg | MEDICATED_PATCH | Freq: Every day | TRANSDERMAL | Status: DC
  Administered 2015-11-30 – 2015-12-03 (×4): 21 mg via TRANSDERMAL
  Filled 2015-11-30 (×5): qty 1

## 2015-11-30 MED ORDER — SODIUM CHLORIDE 0.9 % IV SOLN
500.0000 mg | Freq: Once | INTRAVENOUS | Status: AC
  Administered 2015-11-30: 500 mg via INTRAVENOUS
  Filled 2015-11-30: qty 500

## 2015-11-30 MED ORDER — HALOPERIDOL LACTATE 5 MG/ML IJ SOLN
INTRAMUSCULAR | Status: AC
  Administered 2015-11-30: 2.5 mg via INTRAVENOUS
  Filled 2015-11-30: qty 1

## 2015-11-30 MED ORDER — VANCOMYCIN HCL IN DEXTROSE 1-5 GM/200ML-% IV SOLN
1000.0000 mg | Freq: Three times a day (TID) | INTRAVENOUS | Status: DC
  Administered 2015-12-01 (×2): 1000 mg via INTRAVENOUS
  Filled 2015-11-30 (×2): qty 200

## 2015-11-30 MED ORDER — LORAZEPAM 2 MG/ML IJ SOLN: 0.5000 mg | INTRAMUSCULAR | Status: DC | PRN

## 2015-11-30 MED ORDER — HALOPERIDOL LACTATE 5 MG/ML IJ SOLN
2.5000 mg | INTRAMUSCULAR | Status: DC | PRN
  Administered 2015-11-30: 2.5 mg via INTRAVENOUS
  Filled 2015-11-30: qty 1

## 2015-11-30 MED ORDER — HALOPERIDOL LACTATE 5 MG/ML IJ SOLN
5.0000 mg | INTRAMUSCULAR | Status: DC | PRN
  Administered 2015-11-30 – 2015-12-01 (×7): 5 mg via INTRAVENOUS
  Filled 2015-11-30 (×8): qty 1

## 2015-11-30 NOTE — Consult Note (Signed)
PULMONARY / CRITICAL CARE MEDICINE   Name: Madeline Andrade MRN: [id number] DOB: 05/25/1991    ADMISSION DATE:  11/29/2015 CONSULTATION DATE:  3/21  REFERRING MD:  Elgergawy   CHIEF COMPLAINT:  Acute encephalopathy and agitated delirium   HISTORY OF PRESENT ILLNESS:   25 year old female w/ h/o IVDA. Admitted on 3/20 w/ cc: chest pain, associated sweats, chills and cough because of this she called EMS. On arrival to the ED she was found to be thrombocytopenic, cxr was widely abnormal so CT chest was obtained showing numerous bilateral lung opacities worrisome for septic emboli. She was pan-cultured and empiric antibiotics were started.  Blood cultures were obtained: these were + for SA, UC and UC was noted for E-coli. During the evening hours of 3/20 and into the early am hours of 3/21 she became progressively confused and combative requiring several doses of lorazepam and haldol. PCCM was consulted on 3/21 w/ the working concern that w/d was the major concern and the feeling that she might benefit from precedex gtt.   PAST MEDICAL HISTORY :  She  has a past medical history of Hepatitis C; Gall bladder disease; and Heroin abuse.  PAST SURGICAL HISTORY: She  has past surgical history that includes Cesarean section.  No Known Allergies  No current facility-administered medications on file prior to encounter.   No current outpatient prescriptions on file prior to encounter.    FAMILY HISTORY:  Her has no family status information on file.   SOCIAL HISTORY: She  reports that she has been smoking Cigarettes.  She has been smoking about 1.00 pack per day. She does not have any smokeless tobacco history on file. She reports that she drinks alcohol. She reports that she uses illicit drugs.  REVIEW OF SYSTEMS:   Unable   SUBJECTIVE:  Intermittently confused and anxious  VITAL SIGNS: BP 144/94 mmHg  Pulse 107  Temp(Src) 98.6 F (37 C) (Oral)  Resp 23  Ht 5\' 7"  (1.702 m)  Wt 120 lb  (54.432 kg)  BMI 18.79 kg/m2  SpO2 96%  HEMODYNAMICS:    VENTILATOR SETTINGS:    INTAKE / OUTPUT: I/O last 3 completed shifts: In: 2440 [P.O.:840; I.V.:1300; IV Piggyback:300] Out: 2000 [Urine:2000]  PHYSICAL EXAMINATION: General:  25 year old white female, currently resting in bed. Her rr is currently in 30s + w/ mild accessory muscle use  Neuro:  Awake, intermittently agitated no focal motor def  HEENT:  N/c, MMM, PERRL  Cardiovascular:  Rrr, no MRG Lungs:  Clear, + accessory muscle use  Abdomen:  Soft, non-tender + bowel sounds Musculoskeletal:  Intact. Equal st and bulk  Skin:  Intact w/ scattered areas of ecchymosis   LABS:  BMET  Recent Labs Lab 11/29/15 0357 11/30/15 0341  NA 137 140  K 2.9* 3.3*  CL 96* 108  CO2 30 24  BUN 10 11  CREATININE 0.48 0.46  GLUCOSE 147* 129*    Electrolytes  Recent Labs Lab 11/29/15 0357 11/29/15 0842 11/30/15 0341  CALCIUM 8.1*  --  7.4*  MG  --  1.5* 1.7    CBC  Recent Labs Lab 11/29/15 0357 11/30/15 0341  WBC 7.4 9.5  HGB 11.4* 8.8*  HCT 34.9* 25.8*  PLT 87* 117*    Coag's  Recent Labs Lab 11/29/15 0807 11/30/15 0341  INR 1.12 1.11    Sepsis Markers  Recent Labs Lab 11/29/15 0808 11/29/15 1136  LATICACIDVEN 2.6* 1.3    ABG No results for input(s):  PHART, PCO2ART, PO2ART in the last 168 hours.  Liver Enzymes  Recent Labs Lab 11/29/15 0842 11/30/15 0341  AST 50* 33  ALT 34 30  ALKPHOS 160* 144*  BILITOT 0.6 0.7  ALBUMIN 2.2* 2.0*    Cardiac Enzymes No results for input(s): TROPONINI, PROBNP in the last 168 hours.  Glucose No results for input(s): GLUCAP in the last 168 hours.  Imaging No results found.   STUDIES:  ECHO 3/21: EF 55-60%. mobile echodensity and thickening of the posterior tricuspid valve leaflet measuring 17 x 11 mm suspicious for tricuspid valve endocarditis and associated with moderate to severe tricuspid regurgitation. CT chest 3/20: Negative for acute  pulmonary embolism.. Numerous peripheral nodules and patchy consolidation throughout both lungs. This likely is infectious. Septic emboli is a consideration. Atypical infection is a consideration. Neoplasm is unlikely. 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 Now w/ w/d. She has a h/o IVDA as well as other illicit drugs. Started on escalating lorazepam and haldol. Now to start on precedex gtt. Will support w/d w/ precedex, cont IVFs, keep euvolemic, cont abx. She will eventually need TEE. She is at high risk for decompensation.   ASSESSMENT / PLAN:  PULMONARY A: Tachypnea in setting w/d +/- sepsis  Multiple pulmonary nodules Probable septic emboli  P:   Ck abg Repeat/Ck lactic acid   CARDIOVASCULAR A:  Sepsis  P:  Cont IVFs Cont tele  See ID section   RENAL A:   At risk for AKI P:   Cont IVFs Avoid hypotension  Strict I&O  GASTROINTESTINAL A:   No acute but is a aspiration risk  P:   Npo for now   HEMATOLOGIC A:   Anemia of critical illness Thrombocytopenia  P:  Trend CBC [place] heparin  Transfuse per ICU protocol   INFECTIOUS A:   Staph bacteremia w/ endocarditis  E coli UTI  Hep C  Severe sepsis  P:   abx per ID Eventually needs TEE  Hep C work up per ID   ENDOCRINE A:   No acute  P:   Trend cbgs   NEUROLOGIC A:   Acute Encephalopathy  P:   RASS goal: 0 Precedex gtt Add clonidine patch Cont supportive care   FAMILY  - Updates: pending   - Inter-disciplinary family meet or Palliative Care meeting due by:  3/28    Simonne Martinet ACNP-BC [facility] Pulmonary/Critical Care Pager # [phone] OR # [phone] if no answer   11/30/2015, 1:41 PM

## 2015-11-30 NOTE — Progress Notes (Signed)
Bilateral wrist restraints placed on the pt after finding the pt on the side of the bed attempting to get out of the bed. Pt also found with both peripheral IVs out and covered in blood.Pt reoriented to situation and place. Pt bathed and assisted back to the bed.

## 2015-11-30 NOTE — Progress Notes (Signed)
Notified K. Kirby,NP of pt having increased agitation,HR in the 130s yelling and attempting to get of bed. Pt reoriented to situation multiple times with no evidence of understanding noted. Ativan 0.5mg  IV given per order but no decreased in agitation noted. Order received for Haldol and dosage change on ativan.

## 2015-11-30 NOTE — Progress Notes (Signed)
*  PRELIMINARY RESULTS* Echocardiogram 2D Echocardiogram has been performed.  Jeryl Columbialliott, Pinchos Topel 11/30/2015, 12:01 PM

## 2015-11-30 NOTE — Consult Note (Addendum)
[name]  Date of Admission:  11/29/2015  Date of Consult:  11/30/2015  Reason for Consult: Staph bacteremia Referring Physician: Elgergawy  Impression/Recommendation Sepsis Staph bacteremia Septic emboli Will continue vanco She needs TEE, high suspicion for IE (just got TTE) Recheck BCx in AM  Sputum Cx Gram stain GNR and GPC, many SE.  Suspect this is a contaminant Hopefully stop zosyn in AM  Hepatitis C Will check her genotype and VL She will not be treatment candidate til she is clean.   IVDA HIV (-) UDS+ cocaine, opiates, Benzo Withdrawal precautions  Protein Albumin Malnutrition, severe Nutrition eval  Agitation Received haldol, ativan overnight.   Thank you so much for this interesting consult,   Bobby Rumpf (pager) [phone] [email or web address]  Madeline Andrade is an 26 y.o. female.  HPI: 25 yo F with hx of heroin abuse adm on 3-20 with lethargy, f/c, chest pain and cough. She had been in rehab 1 month ago,out 1 week and then relapsed.  In ED she was found to be afebrile, thrombocytopenic, and had CT chest that showed septic emboli. BCx were sent and she was started on ceftriaxone/vanco/azithro.   She has since been found to have 4/4 BCX+ for GPC --> Staph aureus. Her anbx were changed to zosyn/vanco today.   Past Medical History  Diagnosis Date  . Hepatitis C   . Gall bladder disease   . Heroin abuse     Past Surgical History  Procedure Laterality Date  . Cesarean section       No Known Allergies  Medications:  Scheduled: . nicotine  21 mg Transdermal Daily  . piperacillin-tazobactam (ZOSYN)  IV  3.375 g Intravenous 3 times per day  . vancomycin  500 mg Intravenous Q8H    Abtx:  Anti-infectives    Start     Dose/Rate Route Frequency Ordered Stop   11/30/15 0900  azithromycin (ZITHROMAX) 500 mg in dextrose 5 % 250 mL IVPB  Status:  Discontinued     500 mg 250 mL/hr over 60 Minutes Intravenous  Every 24 hours 11/29/15 1351 11/30/15 0012   11/30/15 0800  azithromycin (ZITHROMAX) 500 mg in dextrose 5 % 250 mL IVPB  Status:  Discontinued     500 mg 250 mL/hr over 60 Minutes Intravenous Every 24 hours 11/29/15 0624 11/29/15 1351   11/30/15 0030  piperacillin-tazobactam (ZOSYN) IVPB 3.375 g     3.375 g 12.5 mL/hr over 240 Minutes Intravenous 3 times per day 11/30/15 0022     11/29/15 1400  vancomycin (VANCOCIN) 500 mg in sodium chloride 0.9 % 100 mL IVPB     500 mg 100 mL/hr over 60 Minutes Intravenous Every 8 hours 11/29/15 0754     11/29/15 0630  cefTRIAXone (ROCEPHIN) 1 g in dextrose 5 % 50 mL IVPB  Status:  Discontinued     1 g 100 mL/hr over 30 Minutes Intravenous  Once 11/29/15 0618 11/29/15 0624   11/29/15 0630  azithromycin (ZITHROMAX) 500 mg in dextrose 5 % 250 mL IVPB     500 mg 250 mL/hr over 60 Minutes Intravenous  Once 11/29/15 0618 11/29/15 0805   11/29/15 0630  cefTRIAXone (ROCEPHIN) 2 g in dextrose 5 % 50 mL IVPB  Status:  Discontinued     2 g 100 mL/hr over 30 Minutes Intravenous Daily 11/29/15 0624 11/30/15 0012   11/29/15 0545  vancomycin (VANCOCIN) IVPB 1000 mg/200 mL premix     1,000 mg 200 mL/hr over 60  Minutes Intravenous  Once 11/29/15 0538 11/29/15 0700   11/29/15 0545  piperacillin-tazobactam (ZOSYN) IVPB 3.375 g  Status:  Discontinued     3.375 g 12.5 mL/hr over 240 Minutes Intravenous  Once 11/29/15 0538 11/29/15 0552      Total days of antibiotics: 1 vanco/zosyn          Social History:  reports that she has been smoking Cigarettes.  She has been smoking about 1.00 pack per day. She does not have any smokeless tobacco history on file. She reports that she drinks alcohol. She reports that she uses illicit drugs.  Family History  Problem Relation Age of Onset  . Multiple sclerosis Mother   . Hypertension Father   . Diabetes Mellitus I Father     General ROS: confused, of limited value. she denies oral ulcers.had HIV/Hepatitis testing - at rehab.  normal BM and urine, denies then states she does have back pain, denies neuropathy. see HPI  Blood pressure 124/96, pulse 107, temperature 99.8 F (37.7 C), temperature source Oral, resp. rate 23, height 5' 7"  (1.702 m), weight 54.432 kg (120 lb), SpO2 96 %. General appearance: alert, cooperative, delirious and mild distress Eyes: negative findings: conjunctivae and sclerae normal, pupils equal, round, reactive to light and accomodation and no injection Throat: normal findings: oropharynx pink & moist without lesions or evidence of thrush and abnormal findings: dentition: poor Neck: no adenopathy and supple, symmetrical, trachea midline Lungs: rhonchi bilaterally and tachypnea Heart: tachycardia, distant Abdomen: normal findings: bowel sounds normal and soft, non-tender Extremities: edema none Skin: mulitple injection sites. no digital or pedal lesions. finger nails painted.  Neurologic: Mental status: alertness: alert, orientation: person, city, date is October. place is [place], ? hospital Motor: moves all extrem, light touch grossly normal.    Results for orders placed or performed during the hospital encounter of 11/29/15 (from the past 48 hour(s))  Basic metabolic panel     Status: Abnormal   Collection Time: 11/29/15  3:57 AM  Result Value Ref Range   Sodium 137 135 - 145 mmol/L   Potassium 2.9 (L) 3.5 - 5.1 mmol/L   Chloride 96 (L) 101 - 111 mmol/L   CO2 30 22 - 32 mmol/L   Glucose, Bld 147 (H) 65 - 99 mg/dL   BUN 10 6 - 20 mg/dL   Creatinine, Ser 0.48 0.44 - 1.00 mg/dL   Calcium 8.1 (L) 8.9 - 10.3 mg/dL   GFR calc non Af Amer >60 >60 mL/min   GFR calc Af Amer >60 >60 mL/min    Comment: (NOTE) The eGFR has been calculated using the CKD EPI equation. This calculation has not been validated in all clinical situations. eGFR's persistently <60 mL/min signify possible Chronic Kidney Disease.    Anion gap 11 5 - 15  CBC with Differential     Status: Abnormal   Collection Time:  11/29/15  3:57 AM  Result Value Ref Range   WBC 7.4 4.0 - 10.5 K/uL   RBC 3.98 3.87 - 5.11 MIL/uL   Hemoglobin 11.4 (L) 12.0 - 15.0 g/dL   HCT 34.9 (L) 36.0 - 46.0 %   MCV 87.7 78.0 - 100.0 fL   MCH 28.6 26.0 - 34.0 pg   MCHC 32.7 30.0 - 36.0 g/dL   RDW 13.7 11.5 - 15.5 %   Platelets 87 (L) 150 - 400 K/uL    Comment: SPECIMEN CHECKED FOR CLOTS REPEATED TO VERIFY PLATELET COUNT CONFIRMED BY SMEAR    Neutrophils Relative %  81 %   Lymphocytes Relative 11 %   Monocytes Relative 8 %   Eosinophils Relative 0 %   Basophils Relative 0 %   Neutro Abs 6.0 1.7 - 7.7 K/uL   Lymphs Abs 0.8 0.7 - 4.0 K/uL   Monocytes Absolute 0.6 0.1 - 1.0 K/uL   Eosinophils Absolute 0.0 0.0 - 0.7 K/uL   Basophils Absolute 0.0 0.0 - 0.1 K/uL  I-stat troponin, ED     Status: None   Collection Time: 11/29/15  4:06 AM  Result Value Ref Range   Troponin i, poc 0.01 0.00 - 0.08 ng/mL   Comment 3            Comment: Due to the release kinetics of cTnI, a negative result within the first hours of the onset of symptoms does not rule out myocardial infarction with certainty. If myocardial infarction is still suspected, repeat the test at appropriate intervals.   Blood culture (routine x 2)     Status: None (Preliminary result)   Collection Time: 11/29/15  5:40 AM  Result Value Ref Range   Specimen Description BLOOD LEFT FOREARM    Special Requests BOTTLES DRAWN AEROBIC AND ANAEROBIC 5CC    Culture  Setup Time      GRAM POSITIVE COCCI IN CLUSTERS IN BOTH AEROBIC AND ANAEROBIC BOTTLES CRITICAL RESULT CALLED TO, READ BACK BY AND VERIFIED WITH: Vira Blanco RN 2022 11/29/15 A BROWNING    Culture      STAPHYLOCOCCUS AUREUS Performed at [facility]    Report Status PENDING   Blood culture (routine x 2)     Status: None (Preliminary result)   Collection Time: 11/29/15  5:45 AM  Result Value Ref Range   Specimen Description BLOOD RIGHT FOREARM    Special Requests BOTTLES DRAWN AEROBIC AND ANAEROBIC 6CC      Culture  Setup Time      GRAM POSITIVE COCCI IN CLUSTERS IN BOTH AEROBIC AND ANAEROBIC BOTTLES CRITICAL RESULT CALLED TO, READ BACK BY AND VERIFIED WITH: Vira Blanco RN 2022 11/29/15 A BROWNING    Culture      STAPHYLOCOCCUS AUREUS Performed at [facility]    Report Status PENDING   Urinalysis, Routine w reflex microscopic (not at [facility])     Status: Abnormal   Collection Time: 11/29/15  6:18 AM  Result Value Ref Range   Color, Urine YELLOW YELLOW   APPearance CLOUDY (A) CLEAR   Specific Gravity, Urine 1.015 1.005 - 1.030   pH 6.0 5.0 - 8.0   Glucose, UA NEGATIVE NEGATIVE mg/dL   Hgb urine dipstick SMALL (A) NEGATIVE   Bilirubin Urine NEGATIVE NEGATIVE   Ketones, ur NEGATIVE NEGATIVE mg/dL   Protein, ur NEGATIVE NEGATIVE mg/dL   Nitrite NEGATIVE NEGATIVE   Leukocytes, UA TRACE (A) NEGATIVE  Urine rapid drug screen (hosp performed)     Status: Abnormal   Collection Time: 11/29/15  6:18 AM  Result Value Ref Range   Opiates POSITIVE (A) NONE DETECTED   Cocaine POSITIVE (A) NONE DETECTED   Benzodiazepines POSITIVE (A) NONE DETECTED   Amphetamines NONE DETECTED NONE DETECTED   Tetrahydrocannabinol NONE DETECTED NONE DETECTED   Barbiturates NONE DETECTED NONE DETECTED    Comment:        DRUG SCREEN FOR MEDICAL PURPOSES ONLY.  IF CONFIRMATION IS NEEDED FOR ANY PURPOSE, NOTIFY LAB WITHIN 5 DAYS.        LOWEST DETECTABLE LIMITS FOR URINE DRUG SCREEN Drug Class  Cutoff (ng/mL) Amphetamine      1000 Barbiturate      200 Benzodiazepine   101 Tricyclics       751 Opiates          300 Cocaine          300 THC              50   Urine microscopic-add on     Status: Abnormal   Collection Time: 11/29/15  6:18 AM  Result Value Ref Range   Squamous Epithelial / LPF 6-30 (A) NONE SEEN   WBC, UA 6-30 0 - 5 WBC/hpf   RBC / HPF 0-5 0 - 5 RBC/hpf   Bacteria, UA MANY (A) NONE SEEN  Strep pneumoniae urinary antigen     Status: None   Collection Time: 11/29/15  6:18 AM   Result Value Ref Range   Strep Pneumo Urinary Antigen NEGATIVE NEGATIVE    Comment:        Infection due to S. pneumoniae cannot be absolutely ruled out since the antigen present may be below the detection limit of the test. Performed at [place]     Status: None   Collection Time: 11/29/15  8:07 AM  Result Value Ref Range   Prothrombin Time 14.6 11.6 - 15.2 seconds   INR 1.12 0.00 - 1.49  Reticulocytes     Status: Abnormal   Collection Time: 11/29/15  8:07 AM  Result Value Ref Range   Retic Ct Pct <0.4 (L) 0.4 - 3.1 %   RBC. 3.91 3.87 - 5.11 MIL/uL   Retic Count, Manual NOT CALCULATED 19.0 - 186.0 K/uL  Lactic acid, plasma     Status: Abnormal   Collection Time: 11/29/15  8:08 AM  Result Value Ref Range   Lactic Acid, Venous 2.6 (HH) 0.5 - 2.0 mmol/L    Comment: CRITICAL RESULT CALLED TO, READ BACK BY AND VERIFIED WITH: L.MCCLURE RN [id number] A.QUIZON   Culture, sputum-assessment     Status: None   Collection Time: 11/29/15  8:22 AM  Result Value Ref Range   Specimen Description SPUTUM    Special Requests NONE    Sputum evaluation      THIS SPECIMEN IS ACCEPTABLE. RESPIRATORY CULTURE REPORT TO FOLLOW.   Report Status 11/29/2015 FINAL   Culture, respiratory (NON-Expectorated)     Status: None (Preliminary result)   Collection Time: 11/29/15  8:22 AM  Result Value Ref Range   Specimen Description SPUTUM    Special Requests NONE    Gram Stain      ABUNDANT WBC PRESENT, PREDOMINANTLY PMN ABUNDANT SQUAMOUS EPITHELIAL CELLS PRESENT ABUNDANT GRAM POSITIVE COCCI IN PAIRS IN CLUSTERS MODERATE GRAM NEGATIVE RODS Performed at [facility]    Culture      Culture reincubated for better growth Performed at [facility]    Report Status PENDING   HIV antibody     Status: None   Collection Time: 11/29/15  8:42 AM  Result Value Ref Range   HIV Screen 4th Generation wRfx Non Reactive Non Reactive    Comment: (NOTE) Performed At:  [facility] [place], [place] [id number] Lindon Romp MD EU:[id number]   Hepatic function panel     Status: Abnormal   Collection Time: 11/29/15  8:42 AM  Result Value Ref Range   Total Protein 5.8 (L) 6.5 - 8.1 g/dL   Albumin 2.2 (L) 3.5 - 5.0 g/dL   AST  50 (H) 15 - 41 U/L   ALT 34 14 - 54 U/L   Alkaline Phosphatase 160 (H) 38 - 126 U/L   Total Bilirubin 0.6 0.3 - 1.2 mg/dL   Bilirubin, Direct 0.4 0.1 - 0.5 mg/dL   Indirect Bilirubin 0.2 (L) 0.3 - 0.9 mg/dL  Lactate dehydrogenase     Status: Abnormal   Collection Time: 11/29/15  8:42 AM  Result Value Ref Range   LDH 226 (H) 98 - 192 U/L  Ferritin     Status: None   Collection Time: 11/29/15  8:42 AM  Result Value Ref Range   Ferritin 252 11 - 307 ng/mL    Comment: Performed at [facility]  Iron and TIBC     Status: Abnormal   Collection Time: 11/29/15  8:42 AM  Result Value Ref Range   Iron 6 (L) 28 - 170 ug/dL   TIBC 169 (L) 250 - 450 ug/dL   Saturation Ratios 4 (L) 10.4 - 31.8 %   UIBC 163 ug/dL    Comment: Performed at [facility]  Magnesium     Status: Abnormal   Collection Time: 11/29/15  8:42 AM  Result Value Ref Range   Magnesium 1.5 (L) 1.7 - 2.4 mg/dL  Lactic acid, plasma     Status: None   Collection Time: 11/29/15 11:36 AM  Result Value Ref Range   Lactic Acid, Venous 1.3 0.5 - 2.0 mmol/L  MRSA PCR Screening     Status: None   Collection Time: 11/29/15 11:41 AM  Result Value Ref Range   MRSA by PCR NEGATIVE NEGATIVE    Comment:        The GeneXpert MRSA Assay (FDA approved for NASAL specimens only), is one component of a comprehensive MRSA colonization surveillance program. It is not intended to diagnose MRSA infection nor to guide or monitor treatment for MRSA infections.   Comprehensive metabolic panel     Status: Abnormal   Collection Time: 11/30/15  3:41 AM  Result Value Ref Range   Sodium 140 135 - 145 mmol/L   Potassium 3.3 (L) 3.5 - 5.1 mmol/L    Chloride 108 101 - 111 mmol/L   CO2 24 22 - 32 mmol/L   Glucose, Bld 129 (H) 65 - 99 mg/dL   BUN 11 6 - 20 mg/dL   Creatinine, Ser 0.46 0.44 - 1.00 mg/dL   Calcium 7.4 (L) 8.9 - 10.3 mg/dL   Total Protein 5.4 (L) 6.5 - 8.1 g/dL   Albumin 2.0 (L) 3.5 - 5.0 g/dL   AST 33 15 - 41 U/L   ALT 30 14 - 54 U/L   Alkaline Phosphatase 144 (H) 38 - 126 U/L   Total Bilirubin 0.7 0.3 - 1.2 mg/dL   GFR calc non Af Amer >60 >60 mL/min   GFR calc Af Amer >60 >60 mL/min    Comment: (NOTE) The eGFR has been calculated using the CKD EPI equation. This calculation has not been validated in all clinical situations. eGFR's persistently <60 mL/min signify possible Chronic Kidney Disease.    Anion gap 8 5 - 15  CBC     Status: Abnormal   Collection Time: 11/30/15  3:41 AM  Result Value Ref Range   WBC 9.5 4.0 - 10.5 K/uL   RBC 3.11 (L) 3.87 - 5.11 MIL/uL   Hemoglobin 8.8 (L) 12.0 - 15.0 g/dL    Comment: RESULT REPEATED AND VERIFIED DELTA CHECK NOTED    HCT 25.8 (L) 36.0 -  46.0 %   MCV 83.0 78.0 - 100.0 fL   MCH 28.3 26.0 - 34.0 pg   MCHC 34.1 30.0 - 36.0 g/dL   RDW 13.8 11.5 - 15.5 %   Platelets 117 (L) 150 - 400 K/uL    Comment: CONSISTENT WITH PREVIOUS RESULT  Protime-INR     Status: None   Collection Time: 11/30/15  3:41 AM  Result Value Ref Range   Prothrombin Time 14.5 11.6 - 15.2 seconds   INR 1.11 0.00 - 1.49      Component Value Date/Time   SDES SPUTUM 11/29/2015 0822   SDES SPUTUM 11/29/2015 0822   SPECREQUEST NONE 11/29/2015 0822   SPECREQUEST NONE 11/29/2015 0822   CULT  11/29/2015 0822    Culture reincubated for better growth Performed at [place] 11/29/2015 FINAL 11/29/2015 0822   REPTSTATUS PENDING 11/29/2015 0822   Ct Angio Chest Pe W/cm &/or Wo Cm  11/29/2015  CLINICAL DATA:  Dyspnea.  Hematemesis. EXAM: CT ANGIOGRAPHY CHEST WITH CONTRAST TECHNIQUE: Multidetector CT imaging of the chest was performed using the standard protocol during bolus  administration of intravenous contrast. Multiplanar CT image reconstructions and MIPs were obtained to evaluate the vascular anatomy. CONTRAST:  139m OMNIPAQUE IOHEXOL 350 MG/ML SOLN COMPARISON:  CT 01/04/2015 FINDINGS: There is good opacification of the pulmonary vasculature. There is no pulmonary embolism. The thoracic aorta is normal in caliber and intact. Review of the MIP images confirms the above findings. There are numerous peripheral nodules and patchy consolidation throughout both lungs, with some predominance in the lower lobes. Some confluence of peripheral opacities in the left lower lobe posteriorly. No cavitation. Symmetric hilar adenopathy.  Nonspecific mediastinal nodes. No pleural effusions. No significant skeletal lesion. IMPRESSION: 1. Negative for acute pulmonary embolism. 2. Numerous peripheral nodules and patchy consolidation throughout both lungs. This likely is infectious. Septic emboli is a consideration. Atypical infection is a consideration. Neoplasm is unlikely. Electronically Signed   By: DAndreas NewportM.D.   On: 11/29/2015 05:09   Recent Results (from the past 240 hour(s))  Blood culture (routine x 2)     Status: None (Preliminary result)   Collection Time: 11/29/15  5:40 AM  Result Value Ref Range Status   Specimen Description BLOOD LEFT FOREARM  Final   Special Requests BOTTLES DRAWN AEROBIC AND ANAEROBIC 5CC  Final   Culture  Setup Time   Final    GRAM POSITIVE COCCI IN CLUSTERS IN BOTH AEROBIC AND ANAEROBIC BOTTLES CRITICAL RESULT CALLED TO, READ BACK BY AND VERIFIED WITH: HVira BlancoRN 2022 11/29/15 A BROWNING    Culture   Final    [facility]US [facility]UREUS [facility]   Report Status PENDING  Incomplete  Blood culture (routine x 2)     Status: None (Preliminary result)   Collection Time: 11/29/15  5:45 AM  Result Value Ref Range Status   Specimen Description BLOOD RIGHT FOREARM  Final   Special Requests BOTTLES DRAWN AEROBIC AND ANAEROBIC  6CC  Final   Culture  Setup Time   Final    GRAM POSITIVE COCCI IN CLUSTERS IN BOTH AEROBIC AND ANAEROBIC BOTTLES CRITICAL RESULT CALLED TO, READ BACK BY AND VERIFIED WITH:Vira BlancoRN 2022 11/29/15 A BROWNING    Culture   Final    STAP[facility]LOCOCCUS AUREUS [facility]   Report Status PENDING  Incomplete  Culture, sputum-assessment     Status: None   Collection Time: 11/29/15  8:22 AM  Result Value Ref Range Status   Specimen Description SPUTUM  Final   Special Requests NONE  Final   Sputum evaluation   Final    THIS SPECIMEN IS ACCEPTABLE. RESPIRATORY CULTURE REPORT TO FOLLOW.   Report Status 11/29/2015 FINAL  Final  Culture, respiratory (NON-Expectorated)     Status: None (Preliminary result)   Collection Time: 11/29/15  8:22 AM  Result Value Ref Range Status   Specimen Description SPUTUM  Final   Special Requests NONE  Final   Gram Stain   Final    ABUNDANT WBC PRESENT, PREDOMINANTLY PMN ABUNDANT SQUAMOUS EPITHELIAL CELLS PRESENT ABUNDANT GRAM POSITIVE COCCI IN PAIRS IN CLUSTERS MODERATE GRAM NEGATIVE RODS Performed at [facility]    Culture   Final    Culture reincubated for better growth Performed at [facility]    Report Status PENDING  Incomplete  MRSA PCR Screening     Status: None   Collection Time: 11/29/15 11:41 AM  Result Value Ref Range Status   MRSA by PCR NEGATIVE NEGATIVE Final    Comment:        The GeneXpert MRSA Assay (FDA approved for NASAL specimens only), is one component of a comprehensive MRSA colonization surveillance program. It is not intended to diagnose MRSA infection nor to guide or monitor treatment for MRSA infections.       11/30/2015, 11:49 AM     LOS: 1 day    Records and images were personally reviewed where available.        [facility] Staphylococcus aureus bacteremia   Staphylococcus aureus bacteremia (SAB) is associated with a high rate of  complications and mortality.  Specific aspects of clinical management are critical to optimizing the outcome of patients with SAB.  Therefore, the [facility] Antimicrobial Management Team [facility]) has initiated an intervention aimed at improving the management of SAB at [facility].  To do so, Infectious Diseases physicians are providing an evidence-based consult for the management of all patients with SAB.     Yes No Comments  Perform follow-up blood cultures (even if the patient is afebrile) to ensure clearance of bacteremia [x]  []    Remove vascular catheter and obtain follow-up blood cultures after the removal of the catheter []  [x]    Perform echocardiography to evaluate for endocarditis (transthoracic ECHO is 40-50% sensitive, TEE is > 90% sensitive) []  []  Please keep in mind, that neither test can definitively EXCLUDE endocarditis, and that should clinical suspicion remain high for endocarditis the patient should then still be treated with an "endocarditis" duration of therapy = 6 weeks  Consult electrophysiologist to evaluate implanted cardiac device (pacemaker, ICD) []  []    Ensure source control []  []  Have all abscesses been drained effectively? Have deep seeded infections (septic joints or osteomyelitis) had appropriate surgical debridement?  Investigate for "metastatic" sites of infection []  []  Does the patient have ANY symptom or physical exam finding that would suggest a deeper infection (back or neck pain that may be suggestive of vertebral osteomyelitis or epidural abscess, muscle pain that could be a symptom of pyomyositis)?  Keep in mind that for deep seeded infections MRI imaging with contrast is preferred rather than other often insensitive tests such as plain x-rays, especially early in a patient's presentation.  Change antibiotic therapy to __________________ []  []  Beta-lactam antibiotics are preferred for MSSA due to higher cure rates.   If on Vancomycin, goal trough should be 15 -  20 mcg/mL  Estimated duration of IV antibiotic therapy:   []  []  Consult case management for probably prolonged outpatient IV antibiotic therapy

## 2015-11-30 NOTE — Progress Notes (Signed)
Pharmacy Antibiotic Note  Madeline Andrade is a 25 y.o. female admitted on 11/29/2015 with pneumonia.  Pharmacy has been consulted for Vancomycin dosing.  Vancomycin trough of 4, with 500mg  iv q8hr dosing and prior doses charted correctly.  3/21 PM dose given after level drawn.   Plan:  Give another 500mg  iv vancomycin x1, then Vancomycin 1gm IV every 8 hours.  Goal trough 15-20 mcg/mL.  Height: 5\' 7"  (170.2 cm) Weight: 120 lb (54.432 kg) IBW/kg (Calculated) : 61.6  Temp (24hrs), Avg:99.2 F (37.3 C), Min:98.3 F (36.8 C), Max:99.8 F (37.7 C)   Recent Labs Lab 11/29/15 0357 11/29/15 0808 11/29/15 1136 11/30/15 0341 11/30/15 1658 11/30/15 2123  WBC 7.4  --   --  9.5  --   --   CREATININE 0.48  --   --  0.46  --   --   LATICACIDVEN  --  2.6* 1.3  --  2.3*  --   VANCOTROUGH  --   --   --   --   --  4*    Estimated Creatinine Clearance: 90.7 mL/min (by C-G formula based on Cr of 0.46).    No Known Allergies  Antimicrobials this admission: Azithromycin 3/20 >> 3/20 Ceftriaxone 3/20 >> 3/20 Vancomycin 3/20 >> Zosyn 3/21 >>  Dose adjustments this admission: 3/21 2100 VT = 4, change vancomycin from 500mg  iv q8hr to 1gm iv q8hr   Thank you for allowing pharmacy to be a part of this patient's care.  Aleene DavidsonGrimsley Jr, Janzen Sacks [place] 11/30/2015 11:13 PM

## 2015-11-30 NOTE — Progress Notes (Addendum)
Patient Demographics  Madeline Andrade, is a 25 y.o. female, DOB - March 23, 1991, ZOX:[id number]  Admit date - 11/29/2015   Admitting Physician Alberteen Sam, MD  Outpatient Primary MD for the patient is No PCP Per Patient  LOS - 1   Chief Complaint  Patient presents with  . Shortness of Breath       Admission HPI/Brief narrative: 25 y.o. female with a past medical history significant forIV drug abuse who presents with chest pain and cough, CTA chest significant for multiple pulmonary septic emboli, as well as a blood culture growing Staphylococcus aureus.   Subjective:   Madeline Andrade today has, No headache, No chest pain, No abdominal pain - No Nausea,  Assessment & Plan    Principal Problem:   Sepsis (HCC) Active Problems:   Hepatitis C   Community acquired pneumonia   Thrombocytopenia (HCC)   Hypokalemia   Anemia   Heroin abuse  Sepsis secondary to pulmonary nodules/Staphylococcus bacteremia - in the setting of IV drug abuse. - Patient meets sepsis criteria given tachycardia, tachypnea, fever, and evidence of organ dysfunction (altered mental status). - Initially on Rocephin and azithromycin to cover for community-acquired pneumonia, and IV vancomycin as high suspicion for drug abuse, transition overnight to vancomycin and Zosyn. - Blood culture growing Staphylococcus bacteremia, continue with IV vancomycin. - Acetaminophen and ketorolac when necessary for pain - 2-D echo pending - ID consulted  Polysubstance abuse with IV drug abuse - Patient currently appears to be in withdrawal, will continue with when necessary IV Ativan, will monitor closely in step down, if needed will start on Precedex drip.  Hypokalemia/m - Repleted, recheck in a.m.  Thrombocytopenia:  - No evidence of active bleed, improving, monitor closely, if continues to improve we'll start on subcutaneous heparin for  DVT prophylaxis  Anemia - Most likely anemia of iron deficiency, has normal ferritin level, worsened by delutional effect as on IV fluids, monitor closely and transfuse as needed.  Hepatitis C - Monitor LFT closely  Code Status: Full  Family Communication: Discussed with mother Ms [place] via [phone]), patient with history of IV drug abuse over the last 8 years, has total of 3 children, first 2 were given to adoption, she is taking care of the youngest one, patient is divorced.  Disposition Plan: Return in stepdown   Procedures  none   Consults   none   Medications  Scheduled Meds: . piperacillin-tazobactam (ZOSYN)  IV  3.375 g Intravenous 3 times per day  . vancomycin  500 mg Intravenous Q8H   Continuous Infusions: . 0.9 % NaCl with KCl 20 mEq / L 100 mL/hr at 11/30/15 0921   PRN Meds:.acetaminophen **OR** acetaminophen, albuterol, haloperidol lactate, ketorolac, LORazepam, morphine injection, ondansetron **OR** ondansetron (ZOFRAN) IV, senna-docusate  DVT Prophylaxis   SCDs   Lab Results  Component Value Date   PLT 117* 11/30/2015    Antibiotics    Anti-infectives    Start     Dose/Rate Route Frequency Ordered Stop   11/30/15 0900  azithromycin (ZITHROMAX) 500 mg in dextrose 5 % 250 mL IVPB  Status:  Discontinued     500 mg 250 mL/hr over 60 Minutes Intravenous Every 24 hours 11/29/15 1351 11/30/15 0012   11/30/15 0800  azithromycin (ZITHROMAX) 500 mg in dextrose 5 % 250 mL IVPB  Status:  Discontinued     500 mg 250 mL/hr over 60 Minutes Intravenous Every 24 hours 11/29/15 0624 11/29/15 1351   11/30/15 0030  piperacillin-tazobactam (ZOSYN) IVPB 3.375 g     3.375 g 12.5 mL/hr over 240 Minutes Intravenous 3 times per day 11/30/15 0022     11/29/15 1400  vancomycin (VANCOCIN) 500 mg in sodium chloride 0.9 % 100 mL IVPB     500 mg 100 mL/hr over 60 Minutes Intravenous Every 8 hours 11/29/15 0754     11/29/15 0630  cefTRIAXone (ROCEPHIN) 1 g in  dextrose 5 % 50 mL IVPB  Status:  Discontinued     1 g 100 mL/hr over 30 Minutes Intravenous  Once 11/29/15 0618 11/29/15 0624   11/29/15 0630  azithromycin (ZITHROMAX) 500 mg in dextrose 5 % 250 mL IVPB     500 mg 250 mL/hr over 60 Minutes Intravenous  Once 11/29/15 0618 11/29/15 0805   11/29/15 0630  cefTRIAXone (ROCEPHIN) 2 g in dextrose 5 % 50 mL IVPB  Status:  Discontinued     2 g 100 mL/hr over 30 Minutes Intravenous Daily 11/29/15 0624 11/30/15 0012   11/29/15 0545  vancomycin (VANCOCIN) IVPB 1000 mg/200 mL premix     1,000 mg 200 mL/hr over 60 Minutes Intravenous  Once 11/29/15 0538 11/29/15 0700   11/29/15 0545  piperacillin-tazobactam (ZOSYN) IVPB 3.375 g  Status:  Discontinued     3.375 g 12.5 mL/hr over 240 Minutes Intravenous  Once 11/29/15 0538 11/29/15 0552          Objective:   Filed Vitals:   11/30/15 0300 11/30/15 0400 11/30/15 0600 11/30/15 0800  BP: 131/85 135/75 144/90 130/84  Pulse:  124 121 107  Temp: 99 F (37.2 C)   99.8 F (37.7 C)  TempSrc: Oral     Resp: 45 42 32 42  Height:      Weight:      SpO2:  99% 99% 96%    Wt Readings from Last 3 Encounters:  11/29/15 54.432 kg (120 lb)  08/27/15 57.244 kg (126 lb 3.2 oz)  02/19/15 59.421 kg (131 lb)     Intake/Output Summary (Last 24 hours) at 11/30/15 1114 Last data filed at 11/30/15 1000  Gross per 24 hour  Intake   2740 ml  Output   1950 ml  Net    790 ml     Physical Exam  Patient appeared agitated this a.m., confused. [place].AT,PERRAL Supple Neck,No JVD, No cervical lymphadenopathy appriciated.  Symmetrical Chest wall movement, Good air movement bilaterally, CTAB Tachycardic,No Gallops,Rubs or new Murmurs, No Parasternal Heave +ve B.Sounds, Abd Soft, No tenderness, No organomegaly appriciated, No rebound - guarding or rigidity. No Cyanosis, Clubbing or edema, No new Rash or bruise     Data Review   Micro Results Recent Results (from the past 240 hour(s))  Blood culture (routine  x 2)     Status: None (Preliminary result)   Collection Time: 11/29/15  5:40 AM  Result Value Ref Range Status   Specimen Description BLOOD Andrade FOREARM  Final   Special Requests BOTTLES DRAWN AEROBIC AND ANAEROBIC 5CC  Final   Culture  Setup Time   Final    GRAM POSITIVE COCCI IN CLUSTERS IN BOTH AEROBIC AND ANAEROBIC BOTTLES CRITICAL RESULT CALLED TO, READ BACK BY AND VERIFIED WITHConcepcion Living RN 2022 11/29/15 A BROWNING    Culture   Final  STAPHYLOCOCCUS AUREUS [facility]    Report Status PENDING  Incomplete  Blood culture (routine x 2)     Status: None (Preliminary result)   Collection Time: 11/29/15  5:45 AM  Result Value Ref Range Status   Specimen Description BLOOD RIGHT FOREARM  Final   Special Requests BOTTLES DRAWN AEROBIC AND ANAEROBIC 6CC  Final   Culture  Setup Time   Final    GRAM POSITIVE COCCI IN CLUSTERS IN BOTH AEROBIC AND ANAEROBIC BOTTLES CRITICAL RESULT CALLED TO, READ BACK BY AND VERIFIED WITHConcepcion Living: H JOHNSON RN 2022 11/29/15 A BROWNING    Culture   Final    STAPHYLOCOCCUS AUREUS [facility]    Report Status PENDING  Incomplete  Culture, sputum-assessment     Status: None   Collection Time: 11/29/15  8:22 AM  Result Value Ref Range Status   Specimen Description SPUTUM  Final   Special Requests NONE  Final   Sputum evaluation   Final    THIS SPECIMEN IS ACCEPTABLE. RESPIRATORY CULTURE REPORT TO FOLLOW.   Report Status 11/29/2015 FINAL  Final  Culture, respiratory (NON-Expectorated)     Status: None (Preliminary result)   Collection Time: 11/29/15  8:22 AM  Result Value Ref Range Status   Specimen Description SPUTUM  Final   Special Requests NONE  Final   Gram Stain   Final    ABUNDANT WBC PRESENT, PREDOMINANTLY PMN ABUNDANT SQUAMOUS EPITHELIAL CELLS PRESENT ABUNDANT GRAM POSITIVE COCCI IN PAIRS IN CLUSTERS MODERATE GRAM NEGATIVE RODS [facility]formed at [facility]    Culture   Final    Culture reincubated  for better growth Performed at [facility]    Report Status PENDING  Incomplete  MRSA PCR Screening     Status: None   Collection Time: 11/29/15 11:41 AM  Result Value Ref Range Status   MRSA by PCR NEGATIVE NEGATIVE Final    Comment:        The GeneXpert MRSA Assay (FDA approved for NASAL specimens only), is one component of a comprehensive MRSA colonization surveillance program. It is not intended to diagnose MRSA infection nor to guide or monitor treatment for MRSA infections.     Radiology Reports Ct Angio Chest Pe W/cm &/or Wo Cm  11/29/2015  CLINICAL DATA:  Dyspnea.  Hematemesis. EXAM: CT ANGIOGRAPHY CHEST WITH CONTRAST TECHNIQUE: Multidetector CT imaging of the chest was performed using the standard protocol during bolus administration of intravenous contrast. Multiplanar CT image reconstructions and MIPs were obtained to evaluate the vascular anatomy. CONTRAST:  100mL OMNIPAQUE IOHEXOL 350 MG/ML SOLN COMPARISON:  CT 01/04/2015 FINDINGS: There is good opacification of the pulmonary vasculature. There is no pulmonary embolism. The thoracic aorta is normal in caliber and intact. Review of the MIP images confirms the above findings. There are numerous peripheral nodules and patchy consolidation throughout both lungs, with some predominance in the lower lobes. Some confluence of peripheral opacities in the Andrade lower lobe posteriorly. No cavitation. Symmetric hilar adenopathy.  Nonspecific mediastinal nodes. No pleural effusions. No significant skeletal lesion. IMPRESSION: 1. Negative for acute pulmonary embolism. 2. Numerous peripheral nodules and patchy consolidation throughout both lungs. This likely is infectious. Septic emboli is a consideration. Atypical infection is a consideration. Neoplasm is unlikely. Electronically Signed   By: Ellery Plunkaniel R Mitchell M.D.   On: 11/29/2015 05:09     CBC  Recent Labs Lab 11/29/15 0357 11/30/15 0341  WBC 7.4 9.5  HGB 11.4* 8.8*  HCT  34.9* 25.8*  PLT 87* 117*  MCV 87.7 83.0  MCH 28.6 28.3  MCHC 32.7 34.1  RDW 13.7 13.8  LYMPHSABS 0.8  --   MONOABS 0.6  --   EOSABS 0.0  --   BASOSABS 0.0  --     Chemistries   Recent Labs Lab 11/29/15 0357 11/29/15 0842 11/30/15 0341  NA 137  --  140  K 2.9*  --  3.3*  CL 96*  --  108  CO2 30  --  24  GLUCOSE 147*  --  129*  BUN 10  --  11  CREATININE 0.48  --  0.46  CALCIUM 8.1*  --  7.4*  MG  --  1.5*  --   AST  --  50* 33  ALT  --  34 30  ALKPHOS  --  160* 144*  BILITOT  --  0.6 0.7   ------------------------------------------------------------------------------------------------------------------ estimated creatinine clearance is 90.7 mL/min (by C-G formula based on Cr of 0.46). ------------------------------------------------------------------------------------------------------------------ No results for input(s): HGBA1C in the last 72 hours. ------------------------------------------------------------------------------------------------------------------ No results for input(s): CHOL, HDL, LDLCALC, TRIG, CHOLHDL, LDLDIRECT in the last 72 hours. ------------------------------------------------------------------------------------------------------------------ No results for input(s): TSH, T4TOTAL, T3FREE, THYROIDAB in the last 72 hours.  Invalid input(s): FREET3 ------------------------------------------------------------------------------------------------------------------  Recent Labs  11/29/15 0807 11/29/15 0842  FERRITIN  --  252  TIBC  --  169*  IRON  --  6*  RETICCTPCT <0.4*  --     Coagulation profile  Recent Labs Lab 11/29/15 0807 11/30/15 0341  INR 1.12 1.11    No results for input(s): DDIMER in the last 72 hours.  Cardiac Enzymes No results for input(s): CKMB, TROPONINI, MYOGLOBIN in the last 168 hours.  Invalid input(s):  CK ------------------------------------------------------------------------------------------------------------------ Invalid input(s): POCBNP     Time Spent in minutes  35 minutes   ELGERGAWY, DAWOOD M.D on 11/30/2015 at 11:14 AM  Between 7am to 7pm - Pager - [phone]  After 7pm go to [email or web address] - password [facility]  [facility]   Office  [phone]

## 2015-11-30 NOTE — Progress Notes (Signed)
Pharmacy Antibiotic Note  Madeline Andrade is a 25 y.o. female admitted on 11/29/2015 with bacteremia and sepsis.  Pharmacy has been consulted for Zosyn dosing.  Plan: Zosyn 3.375g IV q8h (4 hour infusion).  Height: 5\' 7"  (170.2 cm) Weight: 120 lb (54.432 kg) IBW/kg (Calculated) : 61.6  Temp (24hrs), Avg:99.3 F (37.4 C), Min:97.6 F (36.4 C), Max:101.2 F (38.4 C)   Recent Labs Lab 11/29/15 0357 11/29/15 0808 11/29/15 1136  WBC 7.4  --   --   CREATININE 0.48  --   --   LATICACIDVEN  --  2.6* 1.3    Estimated Creatinine Clearance: 90.7 mL/min (by C-G formula based on Cr of 0.48).    No Known Allergies  Antimicrobials this admission: Azithromycin 3/20 >> 3/20 Ceftriaxone 3/20 >> 3/20 Vancomycin 3/20 >> Zosyn 3/21 >>   Thank you for allowing pharmacy to be a part of this patient's care.  Aleene DavidsonGrimsley Jr, Arlet Marter Crowford 11/30/2015 12:26 AM

## 2015-11-30 NOTE — Progress Notes (Signed)
eLink Physician-Brief Progress Note Patient Name: Consuela MimesLori Kaye Cottier DOB: 02/08/1991 MRN: [id number]   Date of Service  11/30/2015  HPI/Events of Note  Patient currently admitted with sepsis and bacteremia.  Known IV drug user with IV heroin. Hospitalist on-call contacted regarding  Ongoing agitation and Ativan dose was increased previously. Haldol previously given with limited effect at 2.5 mg. EKG previously showed QTC within normal limits. Patient given Toradol for pain only. Camera check on patient after she attempted to get out of bed and accidentally pulled out peripheral IVs. Patient now laying in bed.No acute distress. Nurse preparing to replace IV.  eICU Interventions  1. Increase Haldol to 5 mg 2. Continue Ativan IV when necessary 3. Morphine 1-3 milligram IV when necessary with instructions to hold for sedation 4. Paged hospitalist on call to discuss case further and update regarding changes in medications     Intervention Category Major Interventions: Change in mental status - evaluation and management;Delirium, psychosis, severe agitation - evaluation and management;Other:  Lawanda CousinsJennings Tannar Broker 11/30/2015, 2:32 AM

## 2015-11-30 NOTE — Progress Notes (Signed)
Pt resting comfortably in bed at this time

## 2015-11-30 NOTE — Care Management Note (Signed)
Case Management Note  Patient Details  Name: Madeline Andrade MRN: [id number] Date of Birth: 04/13/1991  Subjective/Objective: 25 yj/o f admitted w/Sepsis. From home. Restraints.                  Action/Plan:d/c plan home.   Expected Discharge Date:   (unknown)               Expected Discharge Plan:  Home/Self Care  In-House Referral:     Discharge planning Services  CM Consult  Post Acute Care Choice:    Choice offered to:     DME Arranged:    DME Agency:     HH Arranged:    HH Agency:     Status of Service:  In process, will continue to follow  Medicare Important Message Given:    Date Medicare IM Given:    Medicare IM give by:    Date Additional Medicare IM Given:    Additional Medicare Important Message give by:     If discussed at Long Length of Stay Meetings, dates discussed:    Additional Comments:  Lanier ClamMahabir, Stella Encarnacion, RN 11/30/2015, 3:57 PM

## 2015-12-01 DIAGNOSIS — F1910 Other psychoactive substance abuse, uncomplicated: Secondary | ICD-10-CM | POA: Insufficient documentation

## 2015-12-01 DIAGNOSIS — D638 Anemia in other chronic diseases classified elsewhere: Secondary | ICD-10-CM

## 2015-12-01 DIAGNOSIS — I330 Acute and subacute infective endocarditis: Secondary | ICD-10-CM

## 2015-12-01 DIAGNOSIS — I071 Rheumatic tricuspid insufficiency: Secondary | ICD-10-CM

## 2015-12-01 DIAGNOSIS — R7881 Bacteremia: Secondary | ICD-10-CM

## 2015-12-01 LAB — CBC
HCT: 26.7 % — ABNORMAL LOW (ref 36.0–46.0)
HEMOGLOBIN: 9.1 g/dL — AB (ref 12.0–15.0)
MCH: 29.5 pg (ref 26.0–34.0)
MCHC: 34.1 g/dL (ref 30.0–36.0)
MCV: 86.7 fL (ref 78.0–100.0)
PLATELETS: 176 10*3/uL (ref 150–400)
RBC: 3.08 MIL/uL — AB (ref 3.87–5.11)
RDW: 14 % (ref 11.5–15.5)
WBC: 10.4 10*3/uL (ref 4.0–10.5)

## 2015-12-01 LAB — CULTURE, BLOOD (ROUTINE X 2)

## 2015-12-01 LAB — BLOOD GAS, ARTERIAL
Acid-base deficit: 0.8 mmol/L (ref 0.0–2.0)
Bicarbonate: 21.2 mEq/L (ref 20.0–24.0)
DRAWN BY: 441261
FIO2: 0.21
O2 Saturation: 94.4 %
PCO2 ART: 26.3 mmHg — AB (ref 35.0–45.0)
PH ART: 7.517 — AB (ref 7.350–7.450)
PO2 ART: 72.2 mmHg — AB (ref 80.0–100.0)
Patient temperature: 98.6
TCO2: 19.6 mmol/L (ref 0–100)

## 2015-12-01 LAB — BASIC METABOLIC PANEL
Anion gap: 8 (ref 5–15)
BUN: 13 mg/dL (ref 6–20)
CHLORIDE: 108 mmol/L (ref 101–111)
CO2: 21 mmol/L — ABNORMAL LOW (ref 22–32)
CREATININE: 0.51 mg/dL (ref 0.44–1.00)
Calcium: 7.9 mg/dL — ABNORMAL LOW (ref 8.9–10.3)
GFR calc Af Amer: >60 mL/min (ref >60)
GFR calc non Af Amer: >60 mL/min (ref >60)
GLUCOSE: 125 mg/dL — AB (ref 65–99)
POTASSIUM: 4.7 mmol/L (ref 3.5–5.1)
Sodium: 137 mmol/L (ref 135–145)

## 2015-12-01 LAB — CULTURE, RESPIRATORY

## 2015-12-01 LAB — CULTURE, RESPIRATORY W GRAM STAIN

## 2015-12-01 LAB — URINE CULTURE

## 2015-12-01 LAB — PHOSPHORUS: Phosphorus: 4 mg/dL (ref 2.5–4.6)

## 2015-12-01 MED ORDER — IPRATROPIUM BROMIDE 0.02 % IN SOLN
0.5000 mg | RESPIRATORY_TRACT | Status: DC
  Administered 2015-12-01 – 2015-12-02 (×3): 0.5 mg via RESPIRATORY_TRACT
  Filled 2015-12-01 (×3): qty 2.5

## 2015-12-01 MED ORDER — BUDESONIDE 0.5 MG/2ML IN SUSP
0.5000 mg | Freq: Two times a day (BID) | RESPIRATORY_TRACT | Status: DC
  Administered 2015-12-01: 0.5 mg via RESPIRATORY_TRACT
  Filled 2015-12-01: qty 2

## 2015-12-01 MED ORDER — SODIUM CHLORIDE 0.9 % IV SOLN
INTRAVENOUS | Status: DC
  Administered 2015-12-01 (×2): via INTRAVENOUS

## 2015-12-01 MED ORDER — MORPHINE SULFATE (PF) 4 MG/ML IV SOLN
INTRAVENOUS | Status: AC
  Administered 2015-12-01: 19:00:00
  Filled 2015-12-01: qty 1

## 2015-12-01 MED ORDER — HYDRALAZINE HCL 20 MG/ML IJ SOLN
10.0000 mg | INTRAMUSCULAR | Status: DC | PRN
  Administered 2015-12-01: 10 mg via INTRAVENOUS
  Filled 2015-12-01: qty 1

## 2015-12-01 MED ORDER — ENOXAPARIN SODIUM 40 MG/0.4ML ~~LOC~~ SOLN
40.0000 mg | SUBCUTANEOUS | Status: DC
  Administered 2015-12-01 – 2015-12-03 (×3): 40 mg via SUBCUTANEOUS
  Filled 2015-12-01 (×4): qty 0.4

## 2015-12-01 MED ORDER — CEFAZOLIN SODIUM-DEXTROSE 2-4 GM/100ML-% IV SOLN
2.0000 g | Freq: Three times a day (TID) | INTRAVENOUS | Status: DC
  Administered 2015-12-01 – 2015-12-04 (×9): 2 g via INTRAVENOUS
  Filled 2015-12-01 (×11): qty 100

## 2015-12-01 MED ORDER — MORPHINE SULFATE (PF) 2 MG/ML IV SOLN
1.0000 mg | INTRAVENOUS | Status: DC | PRN
  Administered 2015-12-01 – 2015-12-02 (×4): 3 mg via INTRAVENOUS
  Administered 2015-12-02: 2 mg via INTRAVENOUS
  Administered 2015-12-02 (×2): 3 mg via INTRAVENOUS
  Filled 2015-12-01 (×5): qty 2
  Filled 2015-12-01: qty 1

## 2015-12-01 NOTE — Progress Notes (Signed)
CRITICAL VALUE ALERT  Critical value received:  Gram + cocci in clusters - anerobic bottle  Date of notification:  12/01/15  Time of notification:  0615  Critical value read back:Yes.    Nurse who received alert:  Max SaneMackenzie Neville Walston, RN  MD notified (1st page):  Pola CornElink

## 2015-12-01 NOTE — Progress Notes (Signed)
INFECTIOUS DISEASE PROGRESS NOTE  ID: Madeline Andrade is a 25 y.o. female with  Principal Problem:   Sepsis (HCC) Active Problems:   Hepatitis C   Thrombocytopenia (HCC)   Hypokalemia   Anemia   Heroin abuse   Coag negative Staphylococcus bacteremia   Tricuspid valve vegetation   Tricuspid regurgitation  Subjective: would like to leave so she can see her son Cont cough with sputum C/o chest pain  Abtx:  Anti-infectives    Start     Dose/Rate Route Frequency Ordered Stop   12/01/15 0600  vancomycin (VANCOCIN) IVPB 1000 mg/200 mL premix     1,000 mg 200 mL/hr over 60 Minutes Intravenous Every 8 hours 11/30/15 2309     11/30/15 2315  vancomycin (VANCOCIN) 500 mg in sodium chloride 0.9 % 100 mL IVPB     500 mg 100 mL/hr over 60 Minutes Intravenous  Once 11/30/15 2309 12/01/15 0020   11/30/15 0900  azithromycin (ZITHROMAX) 500 mg in dextrose 5 % 250 mL IVPB  Status:  Discontinued     500 mg 250 mL/hr over 60 Minutes Intravenous Every 24 hours 11/29/15 1351 11/30/15 0012   11/30/15 0800  azithromycin (ZITHROMAX) 500 mg in dextrose 5 % 250 mL IVPB  Status:  Discontinued     500 mg 250 mL/hr over 60 Minutes Intravenous Every 24 hours 11/29/15 0624 11/29/15 1351   11/30/15 0030  piperacillin-tazobactam (ZOSYN) IVPB 3.375 g     3.375 g 12.5 mL/hr over 240 Minutes Intravenous 3 times per day 11/30/15 0022     11/29/15 1400  vancomycin (VANCOCIN) 500 mg in sodium chloride 0.9 % 100 mL IVPB  Status:  Discontinued     500 mg 100 mL/hr over 60 Minutes Intravenous Every 8 hours 11/29/15 0754 11/30/15 2308   11/29/15 0630  cefTRIAXone (ROCEPHIN) 1 g in dextrose 5 % 50 mL IVPB  Status:  Discontinued     1 g 100 mL/hr over 30 Minutes Intravenous  Once 11/29/15 0618 11/29/15 0624   11/29/15 0630  azithromycin (ZITHROMAX) 500 mg in dextrose 5 % 250 mL IVPB     500 mg 250 mL/hr over 60 Minutes Intravenous  Once 11/29/15 0618 11/29/15 0805   11/29/15 0630  cefTRIAXone (ROCEPHIN) 2 g in  dextrose 5 % 50 mL IVPB  Status:  Discontinued     2 g 100 mL/hr over 30 Minutes Intravenous Daily 11/29/15 0624 11/30/15 0012   11/29/15 0545  vancomycin (VANCOCIN) IVPB 1000 mg/200 mL premix     1,000 mg 200 mL/hr over 60 Minutes Intravenous  Once 11/29/15 0538 11/29/15 0700   11/29/15 0545  piperacillin-tazobactam (ZOSYN) IVPB 3.375 g  Status:  Discontinued     3.375 g 12.5 mL/hr over 240 Minutes Intravenous  Once 11/29/15 0538 11/29/15 0552      Medications:  Scheduled: . budesonide (PULMICORT) nebulizer solution  0.5 mg Nebulization BID  . cloNIDine  0.2 mg Transdermal Weekly  . enoxaparin (LOVENOX) injection  40 mg Subcutaneous Q24H  . ipratropium  0.5 mg Nebulization Q4H  . nicotine  21 mg Transdermal Daily  . piperacillin-tazobactam (ZOSYN)  IV  3.375 g Intravenous 3 times per day  . vancomycin  1,000 mg Intravenous Q8H    Objective: Vital signs in last 24 hours: Temp:  [98.3 F (36.8 C)-99.7 F (37.6 C)] 99.2 F (37.3 C) (03/22 1200) Pulse Rate:  [65-104] 86 (03/22 1300) Resp:  [24-58] 25 (03/22 1300) BP: (134-172)/(85-114) 146/99 mmHg (03/22 1300) SpO2:  [  92 %-100 %] 98 % (03/22 1300)   General appearance: alert, distracted and mild distress Resp: rhonchi anterior - bilateral Cardio: tachycardia GI: normal findings: bowel sounds normal and soft, non-tender  Lab Results  Recent Labs  11/30/15 0341 12/01/15 0321  WBC 9.5 10.4  HGB 8.8* 9.1*  HCT 25.8* 26.7*  NA 140 137  K 3.3* 4.7  CL 108 108  CO2 24 21*  BUN 11 13  CREATININE 0.46 0.51   Liver Panel  Recent Labs  11/29/15 0842 11/30/15 0341  PROT 5.8* 5.4*  ALBUMIN 2.2* 2.0*  AST 50* 33  ALT 34 30  ALKPHOS 160* 144*  BILITOT 0.6 0.7  BILIDIR 0.4  --   IBILI 0.2*  --    Sedimentation Rate No results for input(s): ESRSEDRATE in the last 72 hours. C-Reactive Protein No results for input(s): CRP in the last 72 hours.  Microbiology: Recent Results (from the past 240 hour(s))  Blood  culture (routine x 2)     Status: None   Collection Time: 11/29/15  5:40 AM  Result Value Ref Range Status   Specimen Description BLOOD LEFT FOREARM  Final   Special Requests BOTTLES DRAWN AEROBIC AND ANAEROBIC 5CC  Final   Culture  Setup Time   Final    GRAM POSITIVE COCCI IN CLUSTERS IN BOTH AEROBIC AND ANAEROBIC BOTTLES CRITICAL RESULT CALLED TO, READ BACK BY AND VERIFIED WITH: Concepcion Living RN 2022 11/29/15 A BROWNING    Culture   Final    STAPHYLOCOCCUS AUREUS SUSCEPTIBILITIES PERFORMED ON PREVIOUS CULTURE WITHIN THE LAST 5 DAYS. Performed at [facility]    Report Status 12/01/2015 FINAL  Final  Blood culture (routine x 2)     Status: None   Collection Time: 11/29/15  5:45 AM  Result Value Ref Range Status   Specimen Description BLOOD RIGHT FOREARM  Final   Special Requests BOTTLES DRAWN AEROBIC AND ANAEROBIC 6CC  Final   Culture  Setup Time   Final    GRAM POSITIVE COCCI IN CLUSTERS IN BOTH AEROBIC AND ANAEROBIC BOTTLES CRITICAL RESULT CALLED TO, READ BACK BY AND VERIFIED WITH: Concepcion Living RN 2022 11/29/15 A BROWNING    Culture   Final    STAPHYLOCOCCUS AUREUS Performed at [facility]    Report Status 12/01/2015 FINAL  Final   Organism ID, Bacteria STAPHYLOCOCCUS AUREUS  Final      Susceptibility   Staphylococcus aureus - MIC*    CIPROFLOXACIN <=0.5 SENSITIVE Sensitive     ERYTHROMYCIN 0.5 SENSITIVE Sensitive     GENTAMICIN <=0.5 SENSITIVE Sensitive     OXACILLIN 0.5 SENSITIVE Sensitive     TETRACYCLINE >=16 RESISTANT Resistant     VANCOMYCIN 1 SENSITIVE Sensitive     TRIMETH/SULFA <=10 SENSITIVE Sensitive     CLINDAMYCIN <=0.25 SENSITIVE Sensitive     RIFAMPIN <=0.5 SENSITIVE Sensitive     Inducible Clindamycin NEGATIVE Sensitive     * STAPHYLOCOCCUS AUREUS  Culture, Urine     Status: None   Collection Time: 11/29/15  6:18 AM  Result Value Ref Range Status   Specimen Description URINE, CLEAN CATCH  Final   Special Requests NONE  Final   Culture   Final     >=100,000 COLONIES/mL ESCHERICHIA COLI Performed at [facility]    Report Status 12/01/2015 FINAL  Final   Organism ID, Bacteria ESCHERICHIA COLI  Final      Susceptibility   Escherichia coli - MIC*    AMPICILLIN >=32 RESISTANT  Resistant     CEFAZOLIN <=4 SENSITIVE Sensitive     CEFTRIAXONE <=1 SENSITIVE Sensitive     CIPROFLOXACIN <=0.25 SENSITIVE Sensitive     GENTAMICIN <=1 SENSITIVE Sensitive     IMIPENEM <=0.25 SENSITIVE Sensitive     NITROFURANTOIN <=16 SENSITIVE Sensitive     TRIMETH/SULFA >=320 RESISTANT Resistant     AMPICILLIN/SULBACTAM >=32 RESISTANT Resistant     PIP/TAZO <=4 SENSITIVE Sensitive     * >=100,000 COLONIES/mL ESCHERICHIA COLI  Culture, sputum-assessment     Status: None   Collection Time: 11/29/15  8:22 AM  Result Value Ref Range Status   Specimen Description SPUTUM  Final   Special Requests NONE  Final   Sputum evaluation   Final    THIS SPECIMEN IS ACCEPTABLE. RESPIRATORY CULTURE REPORT TO FOLLOW.   Report Status 11/29/2015 FINAL  Final  Culture, respiratory (NON-Expectorated)     Status: None   Collection Time: 11/29/15  8:22 AM  Result Value Ref Range Status   Specimen Description SPUTUM  Final   Special Requests NONE  Final   Gram Stain   Final    ABUNDANT WBC PRESENT, PREDOMINANTLY PMN ABUNDANT SQUAMOUS EPITHELIAL CELLS PRESENT ABUNDANT GRAM POSITIVE COCCI IN PAIRS IN CLUSTERS MODERATE GRAM NEGATIVE RODS Performed at [facility]    Culture   Final    RARE GROUP A STREP (S.PYOGENES) ISOLATED Note: Beta hemolytic streptococci are predictably susceptible to penicillin and other beta lactams. Susceptibility testing not routinely performed. Performed at [facility]    Report Status 12/01/2015 FINAL  Final  MRSA PCR Screening     Status: None   Collection Time: 11/29/15 11:41 AM  Result Value Ref Range Status   MRSA by PCR NEGATIVE NEGATIVE Final    Comment:        The GeneXpert MRSA Assay (FDA approved for  NASAL specimens only), is one component of a comprehensive MRSA colonization surveillance program. It is not intended to diagnose MRSA infection nor to guide or monitor treatment for MRSA infections.   Culture, blood (routine x 2)     Status: None (Preliminary result)   Collection Time: 11/30/15 12:35 PM  Result Value Ref Range Status   Specimen Description BLOOD RIGHT ARM  Final   Special Requests BOTTLES DRAWN AEROBIC AND ANAEROBIC 10CC  Final   Culture   Final    NO GROWTH 1 DAY [facility]    Report Status PENDING  Incomplete  Culture, blood (routine x 2)     Status: None (Preliminary result)   Collection Time: 11/30/15 12:40 PM  Result Value Ref Range Status   Specimen Description BLOOD RIGHT ARM  Final   Special Requests IN PEDIATRIC BOTTLE 1CC  Final   Culture  Setup Time   Final    GRAM POSITIVE COCCI IN CLUSTERS AEROBIC BOTTLE ONLY CRITICAL RESULT CALLED TO, READ BACK BY AND VERIFIED WITH: Drucilla SchmidtM BARHAM,RN @0559  12/01/15 MKELLY    Culture   Final    TOO YOUNG TO READ Performed at [facility]    Report Status PENDING  Incomplete    Studies/Results: No results found.   Assessment/Plan: Sepsis Staph bacteremia Septic emboli Will continue vanco Add  ancef She needs TEE, high suspicion for IE (just got TTE) Recheck BCx today, repeat on 3-21 is positive 1/3 appreciate pharm assistance with vanco dosing  Sputum Cx Gram stain GNR and GPC, many SE.  Cx shows rare GAS Stop zosyn   Hepatitis C Will  check her genotype and VL She will not be treatment candidate til she is clean.   IVDA HIV (-) UDS+ cocaine, opiates, Benzo Withdrawal precautions  Protein Albumin Malnutrition, severe Nutrition eval  Agitation Continue sedation  E coli UTI Add ancef         Johny Sax Infectious Diseases (pager) [phone] [email or web address] 12/01/2015, 2:35 PM  LOS: 2 days

## 2015-12-01 NOTE — Progress Notes (Signed)
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 Admitted on 3/20 w/ cc: chest pain, associated sweats, chills and cough because of this she called EMS. On arrival to the ED she was found to be thrombocytopenic, cxr was widely abnormal so CT chest was obtained showing numerous bilateral lung opacities worrisome for septic emboli. She was pan-cultured and empiric antibiotics were started.  Blood cultures were obtained: these were + for SA, UC and UC was noted for E-coli. During the evening hours of 3/20 and into the early am hours of 3/21 she became progressively confused and combative requiring several doses of lorazepam and haldol. PCCM was consulted on 3/21 w/ the working concern that w/d was the major concern and the feeling that she might benefit from precedex gtt.    SUBJECTIVE:  Intermittently confused and anxious  VITAL SIGNS: BP 142/105 mmHg  Pulse 76  Temp(Src) 98.3 F (36.8 C) (Oral)  Resp 41  Ht 5\' 7"  (1.702 m)  Wt 120 lb (54.432 kg)  BMI 18.79 kg/m2  SpO2 93%  HEMODYNAMICS:    VENTILATOR SETTINGS:    INTAKE / OUTPUT: I/O last 3 completed shifts: In: 5022 [P.O.:840; I.V.:3482; IV Piggyback:700] Out: 1700 [Urine:1700]  PHYSICAL EXAMINATION: General:  25 year old white female, currently resting in bed. Her rr is currently in 2750s + w/ mild accessory muscle use  Neuro:  Lethargic, no follows commands HEENT:  N/c, MMM, PERRL  Cardiovascular:  Rrr, no MRG Lungs:  Clear, + accessory muscle use  Abdomen:  Soft, non-tender + bowel sounds Musculoskeletal:  Intact. Equal st and bulk  Skin:  Intact w/ scattered areas of ecchymosis   LABS:  BMET  Recent Labs Lab 11/29/15 0357 11/30/15 0341 12/01/15 0321  NA 137 140  137  K 2.9* 3.3* 4.7  CL 96* 108 108  CO2 30 24 21*  BUN 10 11 13   CREATININE 0.48 0.46 0.51  GLUCOSE 147* 129* 125*    Electrolytes  Recent Labs Lab 11/29/15 0357 11/29/15 0842 11/30/15 0341 12/01/15 0321  CALCIUM 8.1*  --  7.4* 7.9*  MG  --  1.5* 1.7  --   PHOS  --   --   --  4.0    CBC  Recent Labs Lab 11/29/15 0357 11/30/15 0341 12/01/15 0321  WBC 7.4 9.5 10.4  HGB 11.4* 8.8* 9.1*  HCT 34.9* 25.8* 26.7*  PLT 87* 117* 176    Coag's  Recent Labs Lab 11/29/15 0807 11/30/15 0341  INR 1.12 1.11    Sepsis Markers  Recent Labs Lab 11/29/15 0808 11/29/15 1136 11/30/15 1658  LATICACIDVEN 2.6* 1.3 2.3*    ABG  Recent Labs Lab 11/30/15 1615 12/01/15 0738  PHART 7.532* 7.517*  PCO2ART 25.9* 26.3*  PO2ART 72.1* 72.2*    Liver Enzymes  Recent Labs Lab 11/29/15 0842 11/30/15 0341  AST 50* 33  ALT 34 30  ALKPHOS 160* 144*  BILITOT 0.6 0.7  ALBUMIN 2.2* 2.0*    Cardiac Enzymes No results for input(s): TROPONINI, PROBNP in the last 168 hours.  Glucose No results for input(s): GLUCAP in the last 168 hours.  Imaging No results found.   STUDIES:  ECHO 3/21: EF 55-60%. mobile echodensity and thickening of the  posterior tricuspid valve leaflet measuring 17 x 11 mm suspicious for tricuspid valve endocarditis and associated with moderate to severe tricuspid regurgitation. CT chest 3/20: Negative for acute pulmonary embolism.. Numerous peripheral nodules and patchy consolidation throughout both lungs. This likely is infectious. Septic emboli is a consideration. Atypical infection is a consideration. Neoplasm is unlikely. 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 Now w/ w/d. She has a h/o IVDA  as well as other illicit drugs. She may need intubation for airway protection.   ASSESSMENT / PLAN:  PULMONARY A: Tachypnea in setting w/d +/- sepsis  Multiple pulmonary nodules Probable septic emboli  P:   Ck abg Repeat/Ck lactic acid   CARDIOVASCULAR A:  Sepsis  P:  Cont IVFs Cont tele  See ID section   RENAL Lab Results  Component Value Date   CREATININE 0.51 12/01/2015   CREATININE 0.46 11/30/2015   CREATININE 0.48 11/29/2015    A:   At risk for AKI P:   Cont IVFs Avoid hypotension  Strict I&O  GASTROINTESTINAL A:   No acute but is a aspiration risk  P:   Npo for now   HEMATOLOGIC  Recent Labs  11/30/15 0341 12/01/15 0321  HGB 8.8* 9.1*    A:   Anemia of critical illness Thrombocytopenia  P:  Trend CBC [place] heparin  Transfuse per ICU protocol   INFECTIOUS A:   Staph bacteremia w/ endocarditis  E coli UTI  Hep C  Severe sepsis   P:   abx per ID Eventually needs TEE , if intubated move to [facility] for TEE Hep C work up per ID   ENDOCRINE A:   No acute  P:   Trend cbgs   NEUROLOGIC A:   Acute Encephalopathy  P:   RASS goal: 0 Precedex gtt, Turn off to better evaluate mental status Add clonidine patch Cont supportive care   FAMILY  - Updates: pending   - Inter-disciplinary family meet or Palliative Care meeting due by:  3/28   Global: Continues to have rr in 50's. May need intubation till able to better protect her airway.   Brett Canales Minor ACNP [facility] PCCM Pager [phone] till 3 pm If no answer page [phone] 12/01/2015, 8:43 AM

## 2015-12-01 NOTE — Progress Notes (Signed)
eLink Physician-Brief Progress Note Patient Name: Madeline MimesLori Kaye Sak DOB: 02/11/1991 MRN: [id number]   Date of Service  12/01/2015  HPI/Events of Note  Patient more awake now.  Camera check shows patient with low level of tachypnea and tachycardia on telemetry. Appears uncomfortable. She is complaining of chest pain.  eICU Interventions  Change to morphine IV when necessary every 2 hours     Intervention Category Intermediate Interventions: Pain - evaluation and management  Lawanda CousinsJennings Nestor 12/01/2015, 6:11 PM

## 2015-12-01 NOTE — Progress Notes (Signed)
CSW consulted to assist with financial concerns. PN reviewed. Pt is being treated for Acute encephalopathy and agitated delirium. MD notes pt is intermittently confused and anxious. Pt has a hx of SA. CSW will return to assist pt with psychosocial concerns when her mental status has improved.  Cori RazorJamie Samad Thon LCSW [phone]

## 2015-12-01 NOTE — Progress Notes (Signed)
eLink Physician-Brief Progress Note Patient Name: Consuela MimesLori Kaye Gertner DOB: 08/17/1991 MRN: [id number]   Date of Service  12/01/2015  HPI/Events of Note  Hypertension  eICU Interventions  Hydralazine IV when necessary with parameters     Intervention Category Intermediate Interventions: Hypertension - evaluation and management  Lawanda CousinsJennings Leandra Vanderweele 12/01/2015, 3:50 PM

## 2015-12-01 NOTE — Progress Notes (Signed)
Patient Demographics  Madeline Andrade, is a 25 y.o. female, DOB - 29-Oct-1990, ZOX:[id number]  Admit date - 11/29/2015   Admitting Physician Alberteen Sam, MD  Outpatient Primary MD for the patient is No PCP Per Patient  LOS - 2   Chief Complaint  Patient presents with  . Shortness of Breath       Admission HPI/Brief narrative: 25 y.o. female with a past medical history significant forIV drug abuse who presents with chest pain and cough, CTA chest significant for multiple pulmonary septic emboli- bacteremic with Staphylococcus aureus. TTE reveals vegetation on tricuspid valve  Subjective:    Is sedated.   Assessment & Plan     Sepsis - Staphylococcus bacteremiawith septic pulm emboli - in the setting of IV drug abuse. - Patient meets sepsis criteria given tachycardia, tachypnea, fever, and evidence of organ dysfunction (altered mental status). 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-reading cardiologist recommending TEE- I have consulted cardiology - ID assisting with medications  UTI - E coli- ID to narrow Zosyn - f/u cultures- change to oral if she awakens  Polysubstance abuse with IV drug abuse - in withdrawal- PCCM started Precedex drip and Clonidine  hyperventilating on drip with resp alkalosis- PCCM managing - continuous fluids while sedated  Hypokalemia/m - Repleted   Thrombocytopenia:  - No evidence of active bleed, due to sepsis?-  resolved  Anemia - anemia panel suggest AOCD  Hepatitis C -  ID checking genotype and viral load- treat when drug abuse issues resolved  Nictone abuse - Nicotine patch  Code Status: Full  Family Communication:  Mother Ms [place] via  phone,[phone]),   Disposition Plan:stepdown   Procedures  TTE 3/21 There is a mobile echodensity and thickening of the posterior  tricuspid valve leaflet measuring 17 x 11 mm suspicious for  tricuspid valve endocarditis and associated with moderate to  severe tricuspid regurgitation.  Further evaluation with TEE is recommended.    Consults   ID Cardiology    Medications  Scheduled Meds: . cloNIDine  0.2 mg Transdermal Weekly  . nicotine  21 mg Transdermal Daily  . piperacillin-tazobactam (ZOSYN)  IV  3.375 g Intravenous 3 times per day  . vancomycin  1,000 mg Intravenous Q8H   Continuous Infusions: . 0.9 % NaCl with KCl 20 mEq / L 100 mL/hr at 11/30/15 1940  . dexmedetomidine 1.2 mcg/kg/hr (12/01/15 0756)   PRN Meds:.acetaminophen **OR** acetaminophen, albuterol, haloperidol lactate, LORazepam, morphine injection, ondansetron **OR** ondansetron (ZOFRAN) IV, senna-docusate  DVT Prophylaxis   Lovenox  Lab Results  Component Value Date   PLT 176 12/01/2015    Antibiotics    Anti-infectives    Start     Dose/Rate Route Frequency Ordered Stop   12/01/15 0600  vancomycin (VANCOCIN) IVPB 1000 mg/200 mL premix     1,000 mg 200 mL/hr over 60 Minutes Intravenous Every 8 hours 11/30/15 2309     11/30/15 2315  vancomycin (VANCOCIN) 500 mg in sodium chloride 0.9 % 100 mL IVPB     500 mg 100 mL/hr over 60 Minutes  Intravenous  Once 11/30/15 2309 12/01/15 0020   11/30/15 0900  azithromycin (ZITHROMAX) 500 mg in dextrose 5 % 250 mL IVPB  Status:  Discontinued     500 mg 250 mL/hr over 60 Minutes Intravenous Every 24 hours 11/29/15 1351 11/30/15 0012   11/30/15 0800  azithromycin (ZITHROMAX) 500 mg in dextrose 5 % 250 mL IVPB  Status:  Discontinued     500 mg 250 mL/hr over 60 Minutes Intravenous Every 24 hours 11/29/15 0624 11/29/15 1351   11/30/15 0030  piperacillin-tazobactam (ZOSYN) IVPB 3.375 g     3.375 g 12.5 mL/hr over 240 Minutes Intravenous 3 times per  day 11/30/15 0022     11/29/15 1400  vancomycin (VANCOCIN) 500 mg in sodium chloride 0.9 % 100 mL IVPB  Status:  Discontinued     500 mg 100 mL/hr over 60 Minutes Intravenous Every 8 hours 11/29/15 0754 11/30/15 2308   11/29/15 0630  cefTRIAXone (ROCEPHIN) 1 g in dextrose 5 % 50 mL IVPB  Status:  Discontinued     1 g 100 mL/hr over 30 Minutes Intravenous  Once 11/29/15 0618 11/29/15 0624   11/29/15 0630  azithromycin (ZITHROMAX) 500 mg in dextrose 5 % 250 mL IVPB     500 mg 250 mL/hr over 60 Minutes Intravenous  Once 11/29/15 0618 11/29/15 0805   11/29/15 0630  cefTRIAXone (ROCEPHIN) 2 g in dextrose 5 % 50 mL IVPB  Status:  Discontinued     2 g 100 mL/hr over 30 Minutes Intravenous Daily 11/29/15 0624 11/30/15 0012   11/29/15 0545  vancomycin (VANCOCIN) IVPB 1000 mg/200 mL premix     1,000 mg 200 mL/hr over 60 Minutes Intravenous  Once 11/29/15 0538 11/29/15 0700   11/29/15 0545  piperacillin-tazobactam (ZOSYN) IVPB 3.375 g  Status:  Discontinued     3.375 g 12.5 mL/hr over 240 Minutes Intravenous  Once 11/29/15 0538 11/29/15 0552          Objective:   Filed Vitals:   12/01/15 0400 12/01/15 0500 12/01/15 0600 12/01/15 0800  BP: 152/106  142/105   Pulse: 85 81 76   Temp: 99.7 F (37.6 C)   98.3 F (36.8 C)  TempSrc: Oral     Resp: 51 43 41   Height:      Weight:      SpO2: 95% 94% 93%     Wt Readings from Last 3 Encounters:  11/29/15 54.432 kg (120 lb)  08/27/15 57.244 kg (126 lb 3.2 oz)  02/19/15 59.421 kg (131 lb)     Intake/Output Summary (Last 24 hours) at 12/01/15 0831 Last data filed at 12/01/15 0400  Gross per 24 hour  Intake 2581.97 ml  Output    350 ml  Net 2231.97 ml     Physical Exam  Sedated - [place].AT,PERRAL Supple Neck,No JVD, No cervical lymphadenopathy appriciated.  Symmetrical Chest wall movement, Good air movement bilaterally, CTAB - hyperventilating in high 30s RRR,No Mumurs  +ve B.Sounds, Abd Soft, No tenderness, No organomegaly  appriciated, No rebound - guarding or rigidity. No Cyanosis, Clubbing or edema, No new Rash or bruise     Data Review   Micro Results Recent Results (from the past 240 hour(s))  Blood culture (routine x 2)     Status: None (Preliminary result)   Collection Time: 11/29/15  5:40 AM  Result Value Ref Range Status   Specimen Description BLOOD LEFT FOREARM  Final   Special Requests BOTTLES DRAWN AEROBIC AND ANAEROBIC 5CC  Final  Culture  Setup Time   Final    GRAM POSITIVE COCCI IN CLUSTERS IN BOTH AEROBIC AND ANAEROBIC BOTTLES CRITICAL RESULT CALLED TO, READ BACK BY AND VERIFIED WITH: Concepcion Living RN 2022 11/29/15 A BROWNING    Culture   Final    STAPHYLOCOCCUS AUREUS Performed at [facility]    Report Status PENDING  Incomplete  Blood culture (routine x 2)     Status: None (Preliminary result)   Collection Time: 11/29/15  5:45 AM  Result Value Ref Range Status   Specimen Description BLOOD RIGHT FOREARM  Final   Special Requests BOTTLES DRAWN AEROBIC AND ANAEROBIC 6CC  Final   Culture  Setup Time   Final    GRAM POSITIVE COCCI IN CLUSTERS IN BOTH AEROBIC AND ANAEROBIC BOTTLES CRITICAL RESULT CALLED TO, READ BACK BY AND VERIFIED WITHConcepcion Living RN 2022 11/29/15 A BROWNING    Culture   Final    STAPHYLOCOCCUS AUREUS Performed at [facility]    Report Status PENDING  Incomplete  Culture, Urine     Status: None (Preliminary result)   Collection Time: 11/29/15  6:18 AM  Result Value Ref Range Status   Specimen Description URINE, CLEAN CATCH  Final   Special Requests NONE  Final   Culture   Final    >=100,000 COLONIES/mL ESCHERICHIA COLI Performed at [facility]    Report Status PENDING  Incomplete  Culture, sputum-assessment     Status: None   Collection Time: 11/29/15  8:22 AM  Result Value Ref Range Status   Specimen Description SPUTUM  Final   Special Requests NONE  Final   Sputum evaluation   Final    THIS SPECIMEN IS ACCEPTABLE. RESPIRATORY  CULTURE REPORT TO FOLLOW.   Report Status 11/29/2015 FINAL  Final  Culture, respiratory (NON-Expectorated)     Status: None (Preliminary result)   Collection Time: 11/29/15  8:22 AM  Result Value Ref Range Status   Specimen Description SPUTUM  Final   Special Requests NONE  Final   Gram Stain   Final    ABUNDANT WBC PRESENT, PREDOMINANTLY PMN ABUNDANT SQUAMOUS EPITHELIAL CELLS PRESENT ABUNDANT GRAM POSITIVE COCCI IN PAIRS IN CLUSTERS MODERATE GRAM NEGATIVE RODS Performed at [facility]    Culture   Final    Culture reincubated for better growth Performed at [facility]    Report Status PENDING  Incomplete  MRSA PCR Screening     Status: None   Collection Time: 11/29/15 11:41 AM  Result Value Ref Range Status   MRSA by PCR NEGATIVE NEGATIVE Final    Comment:        The GeneXpert MRSA Assay (FDA approved for NASAL specimens only), is one component of a comprehensive MRSA colonization surveillance program. It is not intended to diagnose MRSA infection nor to guide or monitor treatment for MRSA infections.   Culture, blood (routine x 2)     Status: None (Preliminary result)   Collection Time: 11/30/15 12:35 PM  Result Value Ref Range Status   Specimen Description BLOOD RIGHT ARM  Final   Special Requests   Final    BOTTLES DRAWN AEROBIC AND ANAEROBIC 10CC Performed at [facility]    Culture PENDING  Incomplete   Report Status PENDING  Incomplete  Culture, blood (routine x 2)     Status: None (Preliminary result)   Collection Time: 11/30/15 12:40 PM  Result Value Ref Range Status   Specimen Description BLOOD RIGHT ARM  Final   Special Requests IN PEDIATRIC BOTTLE 1CC  Final   Culture  Setup Time   Final    GRAM POSITIVE COCCI IN CLUSTERS AEROBIC BOTTLE ONLY CRITICAL RESULT CALLED TO, READ BACK BY AND VERIFIED WITH: Drucilla SchmidtM BARHAM,RN @0559  12/01/15 MKELLY [facility]formed [facility]    Culture PENDING  Incomplete   Report Status PENDING   Incomplete    Radiology Reports Ct Angio Chest Pe W/cm &/or Wo Cm  11/29/2015  CLINICAL DATA:  Dyspnea.  Hematemesis. EXAM: CT ANGIOGRAPHY CHEST WITH CONTRAST TECHNIQUE: Multidetector CT imaging of the chest was performed using the standard protocol during bolus administration of intravenous contrast. Multiplanar CT image reconstructions and MIPs were obtained to evaluate the vascular anatomy. CONTRAST:  100mL OMNIPAQUE IOHEXOL 350 MG/ML SOLN COMPARISON:  CT 01/04/2015 FINDINGS: There is good opacification of the pulmonary vasculature. There is no pulmonary embolism. The thoracic aorta is normal in caliber and intact. Review of the MIP images confirms the above findings. There are numerous peripheral nodules and patchy consolidation throughout both lungs, with some predominance in the lower lobes. Some confluence of peripheral opacities in the left lower lobe posteriorly. No cavitation. Symmetric hilar adenopathy.  Nonspecific mediastinal nodes. No pleural effusions. No significant skeletal lesion. IMPRESSION: 1. Negative for acute pulmonary embolism. 2. Numerous peripheral nodules and patchy consolidation throughout both lungs. This likely is infectious. Septic emboli is a consideration. Atypical infection is a consideration. Neoplasm is unlikely. Electronically Signed   By: Ellery Plunkaniel R Mitchell M.D.   On: 11/29/2015 05:09     CBC  Recent Labs Lab 11/29/15 0357 11/30/15 0341 12/01/15 0321  WBC 7.4 9.5 10.4  HGB 11.4* 8.8* 9.1*  HCT 34.9* 25.8* 26.7*  PLT 87* 117* 176  MCV 87.7 83.0 86.7  MCH 28.6 28.3 29.5  MCHC 32.7 34.1 34.1  RDW 13.7 13.8 14.0  LYMPHSABS 0.8  --   --   MONOABS 0.6  --   --   EOSABS 0.0  --   --   BASOSABS 0.0  --   --     Chemistries   Recent Labs Lab 11/29/15 0357 11/29/15 0842 11/30/15 0341 12/01/15 0321  NA 137  --  140 137  K 2.9*  --  3.3* 4.7  CL 96*  --  108 108  CO2 30  --  24 21*  GLUCOSE 147*  --  129* 125*  BUN 10  --  11 13  CREATININE 0.48  --   0.46 0.51  CALCIUM 8.1*  --  7.4* 7.9*  MG  --  1.5* 1.7  --   AST  --  50* 33  --   ALT  --  34 30  --   ALKPHOS  --  160* 144*  --   BILITOT  --  0.6 0.7  --    ------------------------------------------------------------------------------------------------------------------ estimated creatinine clearance is 90.7 mL/min (by C-G formula based on Cr of 0.51). ------------------------------------------------------------------------------------------------------------------ No results for input(s): HGBA1C in the last 72 hours. ------------------------------------------------------------------------------------------------------------------ No results for input(s): CHOL, HDL, LDLCALC, TRIG, CHOLHDL, LDLDIRECT in the last 72 hours. ------------------------------------------------------------------------------------------------------------------ No results for input(s): TSH, T4TOTAL, T3FREE, THYROIDAB in the last 72 hours.  Invalid input(s): FREET3 ------------------------------------------------------------------------------------------------------------------  Recent Labs  11/29/15 0807 11/29/15 0842  FERRITIN  --  252  TIBC  --  169*  IRON  --  6*  RETICCTPCT <0.4*  --     Coagulation profile  Recent Labs Lab 11/29/15 0807 11/30/15 0341  INR 1.12 1.11  No results for input(s): DDIMER in the last 72 hours.  Cardiac Enzymes No results for input(s): CKMB, TROPONINI, MYOGLOBIN in the last 168 hours.  Invalid input(s): CK ------------------------------------------------------------------------------------------------------------------ Invalid input(s): POCBNP     Time Spent in minutes  35 minutes   Hanifah Royse M.D on 12/01/2015 at 8:31 AM Pager: [email or web address] - password [facility]  [facility]   Office  [phone]

## 2015-12-02 ENCOUNTER — Inpatient Hospital Stay (HOSPITAL_COMMUNITY): Payer: Self-pay

## 2015-12-02 DIAGNOSIS — N390 Urinary tract infection, site not specified: Secondary | ICD-10-CM

## 2015-12-02 DIAGNOSIS — F1910 Other psychoactive substance abuse, uncomplicated: Secondary | ICD-10-CM | POA: Insufficient documentation

## 2015-12-02 DIAGNOSIS — J9690 Respiratory failure, unspecified, unspecified whether with hypoxia or hypercapnia: Secondary | ICD-10-CM | POA: Insufficient documentation

## 2015-12-02 DIAGNOSIS — J9601 Acute respiratory failure with hypoxia: Secondary | ICD-10-CM

## 2015-12-02 DIAGNOSIS — I330 Acute and subacute infective endocarditis: Secondary | ICD-10-CM

## 2015-12-02 DIAGNOSIS — B9620 Unspecified Escherichia coli [E. coli] as the cause of diseases classified elsewhere: Secondary | ICD-10-CM

## 2015-12-02 DIAGNOSIS — A419 Sepsis, unspecified organism: Secondary | ICD-10-CM

## 2015-12-02 DIAGNOSIS — B9561 Methicillin susceptible Staphylococcus aureus infection as the cause of diseases classified elsewhere: Secondary | ICD-10-CM

## 2015-12-02 LAB — BASIC METABOLIC PANEL
ANION GAP: 7 (ref 5–15)
BUN: 11 mg/dL (ref 6–20)
CALCIUM: 7.7 mg/dL — AB (ref 8.9–10.3)
CO2: 21 mmol/L — ABNORMAL LOW (ref 22–32)
CREATININE: 0.45 mg/dL (ref 0.44–1.00)
Chloride: 107 mmol/L (ref 101–111)
Glucose, Bld: 104 mg/dL — ABNORMAL HIGH (ref 65–99)
Potassium: 3.5 mmol/L (ref 3.5–5.1)
SODIUM: 135 mmol/L (ref 135–145)

## 2015-12-02 LAB — CULTURE, BLOOD (ROUTINE X 2)

## 2015-12-02 LAB — BLOOD GAS, ARTERIAL
ACID-BASE EXCESS: 0.3 mmol/L (ref 0.0–2.0)
Bicarbonate: 22.5 mEq/L (ref 20.0–24.0)
DRAWN BY: 308601
FIO2: 0.21
O2 SAT: 95.1 %
PATIENT TEMPERATURE: 37.4
PCO2 ART: 28.7 mmHg — AB (ref 35.0–45.0)
TCO2: 21 mmol/L (ref 0–100)
pH, Arterial: 7.508 — ABNORMAL HIGH (ref 7.350–7.450)
pO2, Arterial: 81.2 mmHg (ref 80.0–100.0)

## 2015-12-02 LAB — CBC
HCT: 24 % — ABNORMAL LOW (ref 36.0–46.0)
HEMOGLOBIN: 8.3 g/dL — AB (ref 12.0–15.0)
MCH: 28.5 pg (ref 26.0–34.0)
MCHC: 34.6 g/dL (ref 30.0–36.0)
MCV: 82.5 fL (ref 78.0–100.0)
PLATELETS: 145 10*3/uL — AB (ref 150–400)
RBC: 2.91 MIL/uL — AB (ref 3.87–5.11)
RDW: 13.8 % (ref 11.5–15.5)
WBC: 9.5 10*3/uL (ref 4.0–10.5)

## 2015-12-02 LAB — MAGNESIUM: Magnesium: 1.4 mg/dL — ABNORMAL LOW (ref 1.7–2.4)

## 2015-12-02 LAB — PHOSPHORUS: PHOSPHORUS: 3.1 mg/dL (ref 2.5–4.6)

## 2015-12-02 MED ORDER — MAGNESIUM SULFATE 4 GM/100ML IV SOLN
4.0000 g | Freq: Once | INTRAVENOUS | Status: AC
  Administered 2015-12-02: 4 g via INTRAVENOUS
  Filled 2015-12-02: qty 100

## 2015-12-02 MED ORDER — KETOROLAC TROMETHAMINE 30 MG/ML IJ SOLN
30.0000 mg | Freq: Four times a day (QID) | INTRAMUSCULAR | Status: DC | PRN
  Administered 2015-12-02 – 2015-12-04 (×6): 30 mg via INTRAVENOUS
  Filled 2015-12-02 (×6): qty 1

## 2015-12-02 MED ORDER — HYDROCOD POLST-CPM POLST ER 10-8 MG/5ML PO SUER
5.0000 mL | Freq: Two times a day (BID) | ORAL | Status: DC
  Administered 2015-12-02 – 2015-12-03 (×4): 5 mL via ORAL
  Filled 2015-12-02 (×4): qty 5

## 2015-12-02 MED ORDER — HYDROCODONE-ACETAMINOPHEN 5-325 MG PO TABS
1.0000 | ORAL_TABLET | Freq: Four times a day (QID) | ORAL | Status: DC | PRN
  Administered 2015-12-02: 1 via ORAL
  Administered 2015-12-02 – 2015-12-04 (×7): 2 via ORAL
  Filled 2015-12-02 (×8): qty 2

## 2015-12-02 MED ORDER — DEXMEDETOMIDINE HCL IN NACL 200 MCG/50ML IV SOLN: 0.4000 ug/kg/h | INTRAVENOUS | Status: DC

## 2015-12-02 NOTE — Progress Notes (Addendum)
INFECTIOUS DISEASE PROGRESS NOTE  ID: Madeline Andrade is a 25 y.o. female with  Principal Problem:   Sepsis (HCC) Active Problems:   Hepatitis C   Thrombocytopenia (HCC)   Hypokalemia   Anemia   Heroin abuse   Coag negative Staphylococcus bacteremia   Tricuspid valve vegetation   Tricuspid regurgitation   IV drug abuse  Subjective: C/o chest pain, R sided. No BM.   Abtx:  Anti-infectives    Start     Dose/Rate Route Frequency Ordered Stop   12/01/15 1600  ceFAZolin (ANCEF) IVPB 2g/100 mL premix     2 g over 30 Minutes Intravenous 3 times per day 12/01/15 1450     12/01/15 0600  vancomycin (VANCOCIN) IVPB 1000 mg/200 mL premix  Status:  Discontinued     1,000 mg 200 mL/hr over 60 Minutes Intravenous Every 8 hours 11/30/15 2309 12/01/15 1450   11/30/15 2315  vancomycin (VANCOCIN) 500 mg in sodium chloride 0.9 % 100 mL IVPB     500 mg 100 mL/hr over 60 Minutes Intravenous  Once 11/30/15 2309 12/01/15 0020   11/30/15 0900  azithromycin (ZITHROMAX) 500 mg in dextrose 5 % 250 mL IVPB  Status:  Discontinued     500 mg 250 mL/hr over 60 Minutes Intravenous Every 24 hours 11/29/15 1351 11/30/15 0012   11/30/15 0800  azithromycin (ZITHROMAX) 500 mg in dextrose 5 % 250 mL IVPB  Status:  Discontinued     500 mg 250 mL/hr over 60 Minutes Intravenous Every 24 hours 11/29/15 0624 11/29/15 1351   11/30/15 0030  piperacillin-tazobactam (ZOSYN) IVPB 3.375 g  Status:  Discontinued     3.375 g 12.5 mL/hr over 240 Minutes Intravenous 3 times per day 11/30/15 0022 12/01/15 1450   11/29/15 1400  vancomycin (VANCOCIN) 500 mg in sodium chloride 0.9 % 100 mL IVPB  Status:  Discontinued     500 mg 100 mL/hr over 60 Minutes Intravenous Every 8 hours 11/29/15 0754 11/30/15 2308   11/29/15 0630  cefTRIAXone (ROCEPHIN) 1 g in dextrose 5 % 50 mL IVPB  Status:  Discontinued     1 g 100 mL/hr over 30 Minutes Intravenous  Once 11/29/15 0618 11/29/15 0624   11/29/15 0630  azithromycin (ZITHROMAX) 500  mg in dextrose 5 % 250 mL IVPB     500 mg 250 mL/hr over 60 Minutes Intravenous  Once 11/29/15 0618 11/29/15 0805   11/29/15 0630  cefTRIAXone (ROCEPHIN) 2 g in dextrose 5 % 50 mL IVPB  Status:  Discontinued     2 g 100 mL/hr over 30 Minutes Intravenous Daily 11/29/15 0624 11/30/15 0012   11/29/15 0545  vancomycin (VANCOCIN) IVPB 1000 mg/200 mL premix     1,000 mg 200 mL/hr over 60 Minutes Intravenous  Once 11/29/15 0538 11/29/15 0700   11/29/15 0545  piperacillin-tazobactam (ZOSYN) IVPB 3.375 g  Status:  Discontinued     3.375 g 12.5 mL/hr over 240 Minutes Intravenous  Once 11/29/15 0538 11/29/15 0552      Medications:  Scheduled: . budesonide (PULMICORT) nebulizer solution  0.5 mg Nebulization BID  .  ceFAZolin (ANCEF) IV  2 g Intravenous 3 times per day  . cloNIDine  0.2 mg Transdermal Weekly  . enoxaparin (LOVENOX) injection  40 mg Subcutaneous Q24H  . ipratropium  0.5 mg Nebulization Q4H  . nicotine  21 mg Transdermal Daily    Objective: Vital signs in last 24 hours: Temp:  [98.4 F (36.9 C)-100.6 F (38.1 C)] 98.8 F (  37.1 C) (03/23 0800) Pulse Rate:  [76-128] 88 (03/23 0700) Resp:  [14-48] 26 (03/23 0800) BP: (98-172)/(48-127) 139/91 mmHg (03/23 0800) SpO2:  [97 %-100 %] 99 % (03/23 0700) Weight:  [59.9 kg (132 lb 0.9 oz)] 59.9 kg (132 lb 0.9 oz) (03/23 0400)   General appearance: alert, cooperative and mild distress Resp: rhonchi bilaterally Cardio: tcahycardia GI: abnormal findings:  hypoactive bowel sounds  Lab Results  Recent Labs  12/01/15 0321 12/02/15 0317  WBC 10.4 9.5  HGB 9.1* 8.3*  HCT 26.7* 24.0*  NA 137 135  K 4.7 3.5  CL 108 107  CO2 21* 21*  BUN 13 11  CREATININE 0.51 0.45   Liver Panel  Recent Labs  11/30/15 0341  PROT 5.4*  ALBUMIN 2.0*  AST 33  ALT 30  ALKPHOS 144*  BILITOT 0.7   Sedimentation Rate No results for input(s): ESRSEDRATE in the last 72 hours. C-Reactive Protein No results for input(s): CRP in the last 72  hours.  Microbiology: Recent Results (from the past 240 hour(s))  Blood culture (routine x 2)     Status: None   Collection Time: 11/29/15  5:40 AM  Result Value Ref Range Status   Specimen Description BLOOD LEFT FOREARM  Final   Special Requests BOTTLES DRAWN AEROBIC AND ANAEROBIC 5CC  Final   Culture  Setup Time   Final    GRAM POSITIVE COCCI IN CLUSTERS IN BOTH AEROBIC AND ANAEROBIC BOTTLES CRITICAL RESULT CALLED TO, READ BACK BY AND VERIFIED WITH: Concepcion Living RN 2022 11/29/15 A BROWNING    Culture   Final    STAPHYLOCOCCUS AUREUS SUSCEPTIBILITIES PERFORMED ON PREVIOUS CULTURE WITHIN THE LAST 5 DAYS. Performed at [facility]    Report Status 12/01/2015 FINAL  Final  Blood culture (routine x 2)     Status: None   Collection Time: 11/29/15  5:45 AM  Result Value Ref Range Status   Specimen Description BLOOD RIGHT FOREARM  Final   Special Requests BOTTLES DRAWN AEROBIC AND ANAEROBIC 6CC  Final   Culture  Setup Time   Final    GRAM POSITIVE COCCI IN CLUSTERS IN BOTH AEROBIC AND ANAEROBIC BOTTLES CRITICAL RESULT CALLED TO, READ BACK BY AND VERIFIED WITH: Concepcion Living RN 2022 11/29/15 A BROWNING    Culture   Final    STAPHYLOCOCCUS AUREUS Performed at [facility]    Report Status 12/01/2015 FINAL  Final   Organism ID, Bacteria STAPHYLOCOCCUS AUREUS  Final      Susceptibility   Staphylococcus aureus - MIC*    CIPROFLOXACIN <=0.5 SENSITIVE Sensitive     ERYTHROMYCIN 0.5 SENSITIVE Sensitive     GENTAMICIN <=0.5 SENSITIVE Sensitive     OXACILLIN 0.5 SENSITIVE Sensitive     TETRACYCLINE >=16 RESISTANT Resistant     VANCOMYCIN 1 SENSITIVE Sensitive     TRIMETH/SULFA <=10 SENSITIVE Sensitive     CLINDAMYCIN <=0.25 SENSITIVE Sensitive     RIFAMPIN <=0.5 SENSITIVE Sensitive     Inducible Clindamycin NEGATIVE Sensitive     * STAPHYLOCOCCUS AUREUS  Culture, Urine     Status: None   Collection Time: 11/29/15  6:18 AM  Result Value Ref Range Status   Specimen Description  URINE, CLEAN CATCH  Final   Special Requests NONE  Final   Culture   Final    >=100,000 COLONIES/mL ESCHERICHIA COLI Performed at [facility]    Report Status 12/01/2015 FINAL  Final   Organism ID, Bacteria ESCHERICHIA COLI  Final  Susceptibility   Escherichia coli - MIC*    AMPICILLIN >=32 RESISTANT Resistant     CEFAZOLIN <=4 SENSITIVE Sensitive     CEFTRIAXONE <=1 SENSITIVE Sensitive     CIPROFLOXACIN <=0.25 SENSITIVE Sensitive     GENTAMICIN <=1 SENSITIVE Sensitive     IMIPENEM <=0.25 SENSITIVE Sensitive     NITROFURANTOIN <=16 SENSITIVE Sensitive     TRIMETH/SULFA >=320 RESISTANT Resistant     AMPICILLIN/SULBACTAM >=32 RESISTANT Resistant     PIP/TAZO <=4 SENSITIVE Sensitive     * >=100,000 COLONIES/mL ESCHERICHIA COLI  Culture, sputum-assessment     Status: None   Collection Time: 11/29/15  8:22 AM  Result Value Ref Range Status   Specimen Description SPUTUM  Final   Special Requests NONE  Final   Sputum evaluation   Final    THIS SPECIMEN IS ACCEPTABLE. RESPIRATORY CULTURE REPORT TO FOLLOW.   Report Status 11/29/2015 FINAL  Final  Culture, respiratory (NON-Expectorated)     Status: None   Collection Time: 11/29/15  8:22 AM  Result Value Ref Range Status   Specimen Description SPUTUM  Final   Special Requests NONE  Final   Gram Stain   Final    ABUNDANT WBC PRESENT, PREDOMINANTLY PMN ABUNDANT SQUAMOUS EPITHELIAL CELLS PRESENT ABUNDANT GRAM POSITIVE COCCI IN PAIRS IN CLUSTERS MODERATE GRAM NEGATIVE RODS Performed at [facility]    Culture   Final    RARE GROUP A STREP (S.PYOGENES) ISOLATED Note: Beta hemolytic streptococci are predictably susceptible to penicillin and other beta lactams. Susceptibility testing not routinely performed. Performed at [facility]    Report Status 12/01/2015 FINAL  Final  MRSA PCR Screening     Status: None   Collection Time: 11/29/15 11:41 AM  Result Value Ref Range Status   MRSA by PCR NEGATIVE  NEGATIVE Final    Comment:        The GeneXpert MRSA Assay (FDA approved for NASAL specimens only), is one component of a comprehensive MRSA colonization surveillance program. It is not intended to diagnose MRSA infection nor to guide or monitor treatment for MRSA infections.   Culture, blood (routine x 2)     Status: None (Preliminary result)   Collection Time: 11/30/15 12:35 PM  Result Value Ref Range Status   Specimen Description BLOOD RIGHT ARM  Final   Special Requests BOTTLES DRAWN AEROBIC AND ANAEROBIC 10CC  Final   Culture   Final    NO GROWTH 1 DAY Performed at [facility]    Report Status PENDING  Incomplete  Culture, blood (routine x 2)     Status: None (Preliminary result)   Collection Time: 11/30/15 12:40 PM  Result Value Ref Range Status   Specimen Description BLOOD RIGHT ARM  Final   Special Requests IN PEDIATRIC BOTTLE 1CC  Final   Culture  Setup Time   Final    GRAM POSITIVE COCCI IN CLUSTERS AEROBIC BOTTLE ONLY CRITICAL RESULT CALLED TO, READ BACK BY AND VERIFIED WITH: Drucilla SchmidtM BARHAM,RN @0559  12/01/15 MKELLY    Culture   Final    TOO YOUNG TO READ Performed at [facility]    Report Status PENDING  Incomplete    Studies/Results: Dg Chest Port 1 View  12/02/2015  CLINICAL DATA:  Respiratory failure EXAM: PORTABLE CHEST 1 VIEW COMPARISON:  11/29/2015 FINDINGS: Cardiac shadow is within normal limits. Patchy nodular changes are again seen throughout both lungs similar to that noted on prior CT examination. No focal confluent infiltrate is seen.  No bony abnormality is noted. IMPRESSION: Patchy nodular changes in both lungs similar to that seen on the prior exam. Electronically Signed   By: Alcide Clever M.D.   On: 12/02/2015 07:13     Assessment/Plan: Sepsis TV IE Staph bacteremia Septic emboli Off vanco Continue high dose ancef- she will need prolonged therapy- will be difficult with her ongoing drug use. I would NOT send her home with a PIC-  she will need placement to complete her therapy.  4-6 weeks of Ancef Recheck BCx today, repeat on 3-21 is positive 1/3  Sputum Cx Gram stain GNR and GPC, many SE.  Cx shows rare GAS  Hepatitis C Will check her genotype and VL She will not be treatment candidate until she is clean.   IVDA HIV (-) UDS+ cocaine, opiates, Benzo Withdrawal precautions  Protein Albumin Malnutrition, severe Nutrition eval  Agitation Continue sedation  E coli UTI On ancef         Johny Sax Infectious Diseases (pager) [phone] [email or web address] 12/02/2015, 8:58 AM  LOS: 3 days

## 2015-12-02 NOTE — Progress Notes (Signed)
[facility] Progress Note   Madeline Andrade  ZOX:[id number]  DOB: August 02, 1991  DOA: 11/29/2015 PCP: No PCP Per Patient  Brief narrative: Madeline Andrade is a 25 y.o. female past medical history significant forIV drug abuse who presents with chest pain and cough, CTA chest significant for multiple pulmonary septic emboli- bacteremic with Staphylococcus aureus. TTE reveals vegetation on tricuspid valve with mod to severe regurg.   Subjective: Coughing. Has pain in chest when coughing.   Assessment/Plan: Sepsis - Staphylococcus bacteremiawith septic pulm emboli - in the setting of IV drug abuse. 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-reading cardiologist recommending TEE- I have consulted cardiology - ID assisting with medications- now on Ancef- will need PICC when blood cultures negative and placement in nursing facility due to Heroin abuse - agree with Toradol for chest pain  UTI - E coli-  senstive to all antibiotics she has been on so far  Polysubstance abuse with IV drug abuse - in withdrawal- PCCM assisting with management - still quite tachycardic- likely from pain-   Hypokalemia - Repleted   Hypomagnesemia - replete again today- recheck tomorrow  Thrombocytopenia:  - No evidence of active bleed, likely due to sepsis   Anemia - anemia panel suggest AOCD  Hepatitis C - ID checking genotype and viral load- treat when drug abuse issues resolved  Nictone abuse - Nicotine patch    Antibiotics: Anti-infectives    Start     Dose/Rate Route Frequency Ordered Stop   12/01/15 1600  ceFAZolin (ANCEF) IVPB 2g/100 mL premix     2 g over 30 Minutes Intravenous 3 times per day 12/01/15 1450     12/01/15 0600  vancomycin (VANCOCIN) IVPB 1000 mg/200 mL premix  Status:  Discontinued     1,000 mg 200 mL/hr over 60  Minutes Intravenous Every 8 hours 11/30/15 2309 12/01/15 1450   11/30/15 2315  vancomycin (VANCOCIN) 500 mg in sodium chloride 0.9 % 100 mL IVPB     500 mg 100 mL/hr over 60 Minutes Intravenous  Once 11/30/15 2309 12/01/15 0020   11/30/15 0900  azithromycin (ZITHROMAX) 500 mg in dextrose 5 % 250 mL IVPB  Status:  Discontinued     500 mg 250 mL/hr over 60 Minutes Intravenous Every 24 hours 11/29/15 1351 11/30/15 0012   11/30/15 0800  azithromycin (ZITHROMAX) 500 mg in dextrose 5 % 250 mL IVPB  Status:  Discontinued     500 mg 250 mL/hr over 60 Minutes Intravenous Every 24 hours 11/29/15 0624 11/29/15 1351   11/30/15 0030  piperacillin-tazobactam (ZOSYN) IVPB 3.375 g  Status:  Discontinued     3.375 g 12.5 mL/hr over 240 Minutes Intravenous 3 times per day 11/30/15 0022 12/01/15 1450   11/29/15 1400  vancomycin (VANCOCIN) 500 mg in sodium chloride 0.9 % 100 mL IVPB  Status:  Discontinued     500 mg 100 mL/hr over 60 Minutes Intravenous Every 8 hours 11/29/15 0754 11/30/15 2308   11/29/15 0630  cefTRIAXone (ROCEPHIN) 1 g in dextrose 5 % 50 mL IVPB  Status:  Discontinued     1 g 100 mL/hr over 30 Minutes Intravenous  Once 11/29/15 0618 11/29/15 0624   11/29/15 0630  azithromycin (ZITHROMAX) 500 mg in dextrose 5 % 250 mL IVPB     500 mg 250 mL/hr over 60 Minutes Intravenous  Once 11/29/15 0618 11/29/15  0805   11/29/15 0630  cefTRIAXone (ROCEPHIN) 2 g in dextrose 5 % 50 mL IVPB  Status:  Discontinued     2 g 100 mL/hr over 30 Minutes Intravenous Daily 11/29/15 0624 11/30/15 0012   11/29/15 0545  vancomycin (VANCOCIN) IVPB 1000 mg/200 mL premix     1,000 mg 200 mL/hr over 60 Minutes Intravenous  Once 11/29/15 0538 11/29/15 0700   11/29/15 0545  piperacillin-tazobactam (ZOSYN) IVPB 3.375 g  Status:  Discontinued     3.375 g 12.5 mL/hr over 240 Minutes Intravenous  Once 11/29/15 0538 11/29/15 0552     Code Status:     Code Status Orders        Start     Ordered   11/29/15 0747  Full  code   Continuous     11/29/15 0746    Code Status History    Date Active Date Inactive Code Status Order ID Comments User Context   This patient has a current code status but no historical code status.     Family Communication:Mother Ms [place]  [phone]) Disposition Plan: transfer to SNF when stable DVT prophylaxis: Lovenox Consultants: PCCm, ID, cardiology Procedures:  TTE 3/21 There is a mobile echodensity and thickening of the posterior  tricuspid valve leaflet measuring 17 x 11 mm suspicious for  tricuspid valve endocarditis and associated with moderate to  severe tricuspid regurgitation.  Further evaluation with TEE is recommended.   Objective: Filed Weights   11/29/15 0745 12/02/15 0400  Weight: 54.432 kg (120 lb) 59.9 kg (132 lb 0.9 oz)    Intake/Output Summary (Last 24 hours) at 12/02/15 1157 Last data filed at 12/02/15 0830  Gross per 24 hour  Intake   2340 ml  Output   4775 ml  Net  -2435 ml     Vitals Filed Vitals:   12/02/15 0700 12/02/15 0800 12/02/15 0900 12/02/15 1136  BP: 124/75 139/91 119/74   Pulse: 88  120   Temp: 98.4 F (36.9 C) 98.8 F (37.1 C) 99.5 F (37.5 C)   TempSrc:      Resp: 32 26 42   Height:      Weight:      SpO2: 99%  100% 100%    Exam:  General:  Pt is alert,  Distressed when coughing  HEENT: No icterus, No thrush, oral mucosa moist  Cardiovascular: regular rate and rhythm, S1/S2 No murmur- tachydardic  Respiratory: rhonchi  Abdomen: Soft, +Bowel sounds, non tender, non distended, no guarding  MSK: No cyanosis or clubbing- no pedal edema   Data Reviewed: Basic Metabolic Panel:  Recent Labs Lab 11/29/15 0357 11/29/15 0842 11/30/15 0341 12/01/15 0321 12/02/15 0317  NA 137  --  140 137 135  K 2.9*  --  3.3* 4.7 3.5  CL 96*  --  108 108 107  CO2 30  --  24 21* 21*  GLUCOSE 147*  --  129* 125* 104*  BUN 10  --  11 13 11   CREATININE 0.48  --  0.46 0.51 0.45  CALCIUM 8.1*  --  7.4* 7.9* 7.7*   MG  --  1.5* 1.7  --  1.4*  PHOS  --   --   --  4.0 3.1   Liver Function Tests:  Recent Labs Lab 11/29/15 0842 11/30/15 0341  AST 50* 33  ALT 34 30  ALKPHOS 160* 144*  BILITOT 0.6 0.7  PROT 5.8* 5.4*  ALBUMIN 2.2* 2.0*   No results for input(s): LIPASE,  AMYLASE in the last 168 hours. No results for input(s): AMMONIA in the last 168 hours. CBC:  Recent Labs Lab 11/29/15 0357 11/30/15 0341 12/01/15 0321 12/02/15 0317  WBC 7.4 9.5 10.4 9.5  NEUTROABS 6.0  --   --   --   HGB 11.4* 8.8* 9.1* 8.3*  HCT 34.9* 25.8* 26.7* 24.0*  MCV 87.7 83.0 86.7 82.5  PLT 87* 117* 176 145*   Cardiac Enzymes: No results for input(s): CKTOTAL, CKMB, CKMBINDEX, TROPONINI in the last 168 hours. BNP (last 3 results) No results for input(s): BNP in the last 8760 hours.  ProBNP (last 3 results) No results for input(s): PROBNP in the last 8760 hours.  CBG: No results for input(s): GLUCAP in the last 168 hours.  Recent Results (from the past 240 hour(s))  Blood culture (routine x 2)     Status: None   Collection Time: 11/29/15  5:40 AM  Result Value Ref Range Status   Specimen Description BLOOD LEFT FOREARM  Final   Special Requests BOTTLES DRAWN AEROBIC AND ANAEROBIC 5CC  Final   Culture  Setup Time   Final    GRAM POSITIVE COCCI IN CLUSTERS IN BOTH AEROBIC AND ANAEROBIC BOTTLES CRITICAL RESULT CALLED TO, READ BACK BY AND VERIFIED WITH: Concepcion Living RN 2022 11/29/15 A BROWNING    Culture   Final    STAPHYLOCOCCUS AUREUS SUSCEPTIBILITIES PERFORMED ON PREVIOUS CULTURE WITHIN THE LAST 5 DAYS. Performed at [facility]    Report Status 12/01/2015 FINAL  Final  Blood culture (routine x 2)     Status: None   Collection Time: 11/29/15  5:45 AM  Result Value Ref Range Status   Specimen Description BLOOD RIGHT FOREARM  Final   Special Requests BOTTLES DRAWN AEROBIC AND ANAEROBIC 6CC  Final   Culture  Setup Time   Final    GRAM POSITIVE COCCI IN CLUSTERS IN BOTH AEROBIC AND ANAEROBIC  BOTTLES CRITICAL RESULT CALLED TO, READ BACK BY AND VERIFIED WITH: Concepcion Living RN 2022 11/29/15 A BROWNING    Culture   Final    STAPHYLOCOCCUS AUREUS Performed at [facility]    Report Status 12/01/2015 FINAL  Final   Organism ID, Bacteria STAPHYLOCOCCUS AUREUS  Final      Susceptibility   Staphylococcus aureus - MIC*    CIPROFLOXACIN <=0.5 SENSITIVE Sensitive     ERYTHROMYCIN 0.5 SENSITIVE Sensitive     GENTAMICIN <=0.5 SENSITIVE Sensitive     OXACILLIN 0.5 SENSITIVE Sensitive     TETRACYCLINE >=16 RESISTANT Resistant     VANCOMYCIN 1 SENSITIVE Sensitive     TRIMETH/SULFA <=10 SENSITIVE Sensitive     CLINDAMYCIN <=0.25 SENSITIVE Sensitive     RIFAMPIN <=0.5 SENSITIVE Sensitive     Inducible Clindamycin NEGATIVE Sensitive     * STAPHYLOCOCCUS AUREUS  Culture, Urine     Status: None   Collection Time: 11/29/15  6:18 AM  Result Value Ref Range Status   Specimen Description URINE, CLEAN CATCH  Final   Special Requests NONE  Final   Culture   Final    >=100,000 COLONIES/mL ESCHERICHIA COLI Performed at [facility]    Report Status 12/01/2015 FINAL  Final   Organism ID, Bacteria ESCHERICHIA COLI  Final      Susceptibility   Escherichia coli - MIC*    AMPICILLIN >=32 RESISTANT Resistant     CEFAZOLIN <=4 SENSITIVE Sensitive     CEFTRIAXONE <=1 SENSITIVE Sensitive     CIPROFLOXACIN <=0.25 SENSITIVE Sensitive  GENTAMICIN <=1 SENSITIVE Sensitive     IMIPENEM <=0.25 SENSITIVE Sensitive     NITROFURANTOIN <=16 SENSITIVE Sensitive     TRIMETH/SULFA >=320 RESISTANT Resistant     AMPICILLIN/SULBACTAM >=32 RESISTANT Resistant     PIP/TAZO <=4 SENSITIVE Sensitive     * >=100,000 COLONIES/mL ESCHERICHIA COLI  Culture, sputum-assessment     Status: None   Collection Time: 11/29/15  8:22 AM  Result Value Ref Range Status   Specimen Description SPUTUM  Final   Special Requests NONE  Final   Sputum evaluation   Final    THIS SPECIMEN IS ACCEPTABLE. RESPIRATORY CULTURE  REPORT TO FOLLOW.   Report Status 11/29/2015 FINAL  Final  Culture, respiratory (NON-Expectorated)     Status: None   Collection Time: 11/29/15  8:22 AM  Result Value Ref Range Status   Specimen Description SPUTUM  Final   Special Requests NONE  Final   Gram Stain   Final    ABUNDANT WBC PRESENT, PREDOMINANTLY PMN ABUNDANT SQUAMOUS EPITHELIAL CELLS PRESENT ABUNDANT GRAM POSITIVE COCCI IN PAIRS IN CLUSTERS MODERATE GRAM NEGATIVE RODS Performed at [facility]    Culture   Final    RARE GROUP A STREP (S.PYOGENES) ISOLATED Note: Beta hemolytic streptococci are predictably susceptible to penicillin and other beta lactams. Susceptibility testing not routinely performed. Performed at [facility]    Report Status 12/01/2015 FINAL  Final  MRSA PCR Screening     Status: None   Collection Time: 11/29/15 11:41 AM  Result Value Ref Range Status   MRSA by PCR NEGATIVE NEGATIVE Final    Comment:        The GeneXpert MRSA Assay (FDA approved for NASAL specimens only), is one component of a comprehensive MRSA colonization surveillance program. It is not intended to diagnose MRSA infection nor to guide or monitor treatment for MRSA infections.   Culture, blood (routine x 2)     Status: None (Preliminary result)   Collection Time: 11/30/15 12:35 PM  Result Value Ref Range Status   Specimen Description BLOOD RIGHT ARM  Final   Special Requests BOTTLES DRAWN AEROBIC AND ANAEROBIC 10CC  Final   Culture   Final    NO GROWTH 1 DAY Performed at [facility]    Report Status PENDING  Incomplete  Culture, blood (routine x 2)     Status: None   Collection Time: 11/30/15 12:40 PM  Result Value Ref Range Status   Specimen Description BLOOD RIGHT ARM  Final   Special Requests IN PEDIATRIC BOTTLE 1CC  Final   Culture  Setup Time   Final    GRAM POSITIVE COCCI IN CLUSTERS AEROBIC BOTTLE ONLY CRITICAL RESULT CALLED TO, READ BACK BY AND VERIFIED WITH: Drucilla Schmidt   12/01/15 MKELLY    Culture   Final    STAPHYLOCOCCUS AUREUS SUSCEPTIBILITIES PERFORMED ON PREVIOUS CULTURE WITHIN THE LAST 5 DAYS. Performed at [facility]    Report Status 12/02/2015 FINAL  Final     Studies: Dg Chest Port 1 View  12/02/2015  CLINICAL DATA:  Respiratory failure EXAM: PORTABLE CHEST 1 VIEW COMPARISON:  11/29/2015 FINDINGS: Cardiac shadow is within normal limits. Patchy nodular changes are again seen throughout both lungs similar to that noted on prior CT examination. No focal confluent infiltrate is seen. No bony abnormality is noted. IMPRESSION: Patchy nodular changes in both lungs similar to that seen on the prior exam. Electronically Signed   By: Alcide Clever M.D.   On: 12/02/2015  07:13    Scheduled Meds:  Scheduled Meds: .  ceFAZolin (ANCEF) IV  2 g Intravenous 3 times per day  . cloNIDine  0.2 mg Transdermal Weekly  . enoxaparin (LOVENOX) injection  40 mg Subcutaneous Q24H  . ipratropium  0.5 mg Nebulization Q4H  . magnesium sulfate 1 - 4 g bolus IVPB  4 g Intravenous Once  . nicotine  21 mg Transdermal Daily   Continuous Infusions: . sodium chloride 75 mL/hr at 12/02/15 1126  . 0.9 % NaCl with KCl 20 mEq / L Stopped (12/01/15 0800)  . dexmedetomidine      Time spent on care of this patient: 35 min   Bonney Berres, MD 12/02/2015, 11:57 AM  LOS: 3 days   [facility] Office  [phone] Pager - Text Page per [email or web address] If 7PM-7AM, please contact night-coverage [email or web address]

## 2015-12-02 NOTE — Progress Notes (Signed)
Date:  December 03, 2015 Chart reviewed for concurrent status and case management needs. Will continue to follow patient for changes and needs: temp 100.9, pulse rate 128 sustained, positive bld cultures, hypotensive Marcelle Smilinghonda Davis, BSN, Charity fundraiserN, [place]   [phone]

## 2015-12-02 NOTE — Consult Note (Signed)
CARDIOLOGY CONSULT NOTE   Patient ID: Madeline Andrade MRN: [id number] DOB/AGE: 1991/07/14 25 y.o.  Admit date: 11/29/2015  Consulting Physician: Dr. Butler Denmark  Primary Physician   No PCP Per Patient Primary Cardiologist   New  Reason for Consultation   Probable endocarditis   HPI: Madeline Andrade is a 25 y.o. female with a history of Hep C, IVDA/polysubstance abuse, and tobacco abuse who presented to [facility] on 11/29/15 complaining of CP, SOB and cough. 2D ECHO with vegetation in the tricuspid valve with mod-severe TR and cardiology consulted.   The patient has a history of IVDA and has been using heroin for the last year. Apparently she was recently in a drug rehab facility for 1 month and discharged a week before admission. She relapsed and used heroin again the day before admission.   She says that for the week prior to admission she has been having worsening chest pain, beginning in the lower ribs in front, and spreading until it was all over her chest and back. It was associated with sweats, chills, and productive cough, and worsening, such that night she called EMS.  In the ED, the patient was tachycardic and tachypneic and writhing in pain. CTA in the ER showed numerous nodular opacities in bilateral lungs, mostly in the periphery, consistent with infection or septic emboli, so blood cultures were obtained which returned positive in 4/4 with Staphylococcus aureus. 2-D echo was concerning for vegetation in the tricuspid valve with mod-severe TR. She developed sepsis with AMS and started withdrawing on her second hospital day. She was getting IV Haldol, benzodiazepine, morphine and she remained agitated and in florid withdrawal. PCCM consulted for heroine and cocaine withdrawal.  She is now more stable and alert and oriented. She still is having severe right sided chest pain. Breathing and cough has improved. We discussed the high probability of having endocarditis and need for TEE for  confirmation.    Past Medical History  Diagnosis Date  . Hepatitis C   . Gall bladder disease   . Heroin abuse      Past Surgical History  Procedure Laterality Date  . Cesarean section      No Known Allergies  I have reviewed the patient's current medications . budesonide (PULMICORT) nebulizer solution  0.5 mg Nebulization BID  .  ceFAZolin (ANCEF) IV  2 g Intravenous 3 times per day  . cloNIDine  0.2 mg Transdermal Weekly  . enoxaparin (LOVENOX) injection  40 mg Subcutaneous Q24H  . ipratropium  0.5 mg Nebulization Q4H  . nicotine  21 mg Transdermal Daily   . sodium chloride 100 mL/hr at 12/01/15 1557  . 0.9 % NaCl with KCl 20 mEq / L Stopped (12/01/15 0800)  . dexmedetomidine Stopped (12/01/15 0845)   acetaminophen **OR** acetaminophen, albuterol, haloperidol lactate, hydrALAZINE, HYDROcodone-acetaminophen, LORazepam, morphine injection, ondansetron **OR** ondansetron (ZOFRAN) IV, senna-docusate  Prior to Admission medications   Not on File     Social History   Social History  . Marital Status: Single    Spouse Name: N/A  . Number of Children: N/A  . Years of Education: N/A   Occupational History  . Not on file.   Social History Main Topics  . Smoking status: Current Every Day Smoker -- 1.00 packs/day    Types: Cigarettes  . Smokeless tobacco: Not on file  . Alcohol Use: Yes     Comment: wine occasional  . Drug Use: Yes     Comment: HEROIN  .  Sexual Activity: Not on file   Other Topics Concern  . Not on file   Social History Narrative    No family status information on file.   Family History  Problem Relation Age of Onset  . Multiple sclerosis Mother   . Hypertension Father   . Diabetes Mellitus I Father      ROS:  Full 14 point review of systems complete and found to be negative unless listed above.  Physical Exam: Blood pressure 139/91, pulse 87, temperature 98.8 F (37.1 C), temperature source Core (Comment), resp. rate 26, height 5\' 7"   (1.702 m), weight 132 lb 0.9 oz (59.9 kg), SpO2 98 %.  General: Well developed, well nourished, female in no acute distress Head: Eyes PERRLA, No xanthomas.   Normocephalic and atraumatic, oropharynx without edema or exudate.  Lungs: mild rhonchi Heart: HRRR S1 S2, no rub/gallop, Heart tachy w/ regular rhythm with S1, S2  murmur. pulses are 2+ extrem.   Neck: No carotid bruits. No lymphadenopathy. No JVD. Abdomen: Bowel sounds present, abdomen soft and non-tender without masses or hernias noted. Msk:  No spine or cva tenderness. No weakness, no joint deformities or effusions. Extremities: No clubbing or cyanosis.  NO LE edema.  Neuro: Alert and oriented X 3. No focal deficits noted. Psych:  Good affect, responds appropriately Skin: No rashes or lesions noted.  Labs:   Lab Results  Component Value Date   WBC 9.5 12/02/2015   HGB 8.3* 12/02/2015   HCT 24.0* 12/02/2015   MCV 82.5 12/02/2015   PLT 145* 12/02/2015    Recent Labs  11/30/15 0341  INR 1.11    Recent Labs Lab 11/30/15 0341  12/02/15 0317  NA 140  < > 135  K 3.3*  < > 3.5  CL 108  < > 107  CO2 24  < > 21*  BUN 11  < > 11  CREATININE 0.46  < > 0.45  CALCIUM 7.4*  < > 7.7*  PROT 5.4*  --   --   BILITOT 0.7  --   --   ALKPHOS 144*  --   --   ALT 30  --   --   AST 33  --   --   GLUCOSE 129*  < > 104*  ALBUMIN 2.0*  --   --   < > = values in this interval not displayed. MAGNESIUM  Date Value Ref Range Status  12/02/2015 1.4* 1.7 - 2.4 mg/dL Final    LIPASE  Date/Time Value Ref Range Status  04/26/2013 07:35 PM 12 11 - 59 U/L Final   No results found for: TSH, T4TOTAL, T3FREE, THYROIDAB FERRITIN  Date/Time Value Ref Range Status  11/29/2015 08:42 AM 252 11 - 307 ng/mL Final    Comment:    Performed at [facility]   TIBC  Date/Time Value Ref Range Status  11/29/2015 08:42 AM 169* 250 - 450 ug/dL Final   IRON  Date/Time Value Ref Range Status  11/29/2015 08:42 AM 6* 28 - 170 ug/dL Final    RETIC CT PCT  Date/Time Value Ref Range Status  11/29/2015 08:07 AM <0.4* 0.4 - 3.1 % Final    Echo: 11/30/2015 LV EF: 55% - 60% Study Conclusions - Left ventricle: The cavity size was normal. Systolic function was  normal. The estimated ejection fraction was in the range of 55%  to 60%. Wall motion was normal; there were no regional wall  motion abnormalities. - Aortic valve: Trileaflet; normal thickness  leaflets. There was no  regurgitation. - Aortic root: The aortic root was normal in size. - Mitral valve: Structurally normal valve. There was mild  regurgitation. - Left atrium: The atrium was normal in size. - Right ventricle: The cavity size was normal. Wall thickness was  normal. Systolic function was normal. - Right atrium: The atrium was normal in size. - Tricuspid valve: There is a mobile echodensity and thickening of  the posterior tricuspid valve leaflet measuring 17 x 11 mm. There  was moderate-severe regurgitation. - Pulmonary arteries: Systolic pressure was within the normal  range. - Inferior vena cava: The vessel was normal in size. - Pericardium, extracardiac: There was no pericardial effusion. Impressions: - There is a mobile echodensity and thickening of the posterior  tricuspid valve leaflet measuring 17 x 11 mm suspicious for  tricuspid valve endocarditis and associated with moderate to  severe tricuspid regurgitation.  Further evaluation with TEE is recommended.  ECG:  HR 116 sinus tachy  Radiology:  Dg Chest Port 1 View  12/02/2015  CLINICAL DATA:  Respiratory failure EXAM: PORTABLE CHEST 1 VIEW COMPARISON:  11/29/2015 FINDINGS: Cardiac shadow is within normal limits. Patchy nodular changes are again seen throughout both lungs similar to that noted on prior CT examination. No focal confluent infiltrate is seen. No bony abnormality is noted. IMPRESSION: Patchy nodular changes in both lungs similar to that seen on the prior exam.  Electronically Signed   By: Alcide Clever M.D.   On: 12/02/2015 07:13    ASSESSMENT AND PLAN:    Principal Problem:   Sepsis (HCC) Active Problems:   Hepatitis C   Thrombocytopenia (HCC)   Hypokalemia   Anemia   Heroin abuse   Coag negative Staphylococcus bacteremia   Tricuspid valve vegetation   Tricuspid regurgitation   IV drug abuse  Madeline Andrade is a 25 y.o. female with a history of Hep C, IVDA/polysubstance abuse, and tobacco abuse who presented to [facility] on 11/29/15 complaining of CP, SOB and cough. 2D ECHO with vegetation in the tricuspid valve with mod-severe TR and cardiology consulted.   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 - in the setting of IV drug abuse. - Patient meets sepsis criteria given tachycardia, tachypnea, fever, and evidence of organ dysfunction (altered mental status). - Initially on Rocephin and azithromycin to cover for community-acquired pneumonia, and IV vancomycin as high suspicion for drug abuse  - repeat blood cultures still positive - 2-D echo reveals tricuspid endocarditis with mod to severe TR. I have arranged for a TEE tomorrow at 11. - ID assisting with medications  Polysubstance abuse with IV drug abuse - was in florid withdrawal- PCCM managing   Hypokalemia - Repleted   Anemia - anemia panel suggest AOCD  Hepatitis C - per IM  Tobacco abuse - Nicotine patch  SignedJanetta Hora, PA-C 12/02/2015 8:39 AM  Pager [phone]  Co-Sign MD  ADDENDUM: It was decided that she will not need the TEE. I have canceled this. She is improving clinically and needs to demonstrate that she can stay clean for at least 30 days before she would be even considered for surgery. Plan to treat with antibiotics.  Will need a follow up TTE at some point to reassess vegetation.   I have examined the patient and reviewed assessment and plan and discussed with patient.  Agree with above as stated.   Ill appearing patient.  Cough present.  O2 sats normal on RA.  No need  for TEE.  I explained this to the patient and nurse, who will inform the patient's family.  SHe is going to need to avoid illegal drugs.  ID involved.   Vernie Vinciguerra S.

## 2015-12-02 NOTE — Progress Notes (Signed)
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 Admitted on 3/20 w/ cc: chest pain, associated sweats, chills and cough because of this she called EMS. On arrival to the ED she was found to be thrombocytopenic, cxr was widely abnormal so CT chest was obtained showing numerous bilateral lung opacities worrisome for septic emboli. She was pan-cultured and empiric antibiotics were started.  Blood cultures were obtained: these were + for SA, UC and UC was noted for E-coli. During the evening hours of 3/20 and into the early am hours of 3/21 she became progressively confused and combative requiring several doses of lorazepam and haldol. PCCM was consulted on 3/21 w/ the working concern that w/d was the major concern and the feeling that she might benefit from precedex gtt.    SUBJECTIVE:  Awake, less confused   VITAL SIGNS: BP 119/74 mmHg  Pulse 120  Temp(Src) 99.5 F (37.5 C) (Core (Comment))  Resp 42  Ht  (1.702 m)  Wt 132 lb 0.9 oz (59.9 kg)  BMI 20.68 kg/m2  SpO2 100%  LMP  (LMP Unknown) Room air    INTAKE / OUTPUT: I/O last 3 completed shifts: In: 4599.7 [P.O.:240; I.V.:3459.7; IV Piggyback:900] Out: 3275 [Urine:3275]  PHYSICAL EXAMINATION: General:  25 year old white female, currently resting in bed no distress today  Neuro:  Awake, oriented. No focal def today. Does have significant pain primarily right sided and pleuritic in nature.  HEENT:  N/c, MMM, PERRL  Cardiovascular:  Rrr, no MRG Lungs:  Clear, + accessory muscle use  Abdomen:  Soft, non-tender + bowel sounds Musculoskeletal:  Intact. Equal st and bulk  Skin:  Intact w/ scattered areas of ecchymosis   LABS:  BMET  Recent Labs Lab 11/30/15 0341  12/01/15 0321 12/02/15 0317  NA 140 137 135  K 3.3* 4.7 3.5  CL 108 108 107  CO2 24 21* 21*  BUN CREATININE 0.46 0.51 0.45  GLUCOSE 129* 125* 104*    Electrolytes  Recent Labs Lab 11/29/15 0842 11/30/15 0341 12/01/15 0321 12/02/15 0317  CALCIUM  --  7.4* 7.9* 7.7*  MG 1.5* 1.7  --  1.4*  PHOS  --   --  4.0 3.1    CBC  Recent Labs Lab 11/30/15 0341 12/01/15 0321 12/02/15 0317  WBC 9.5 10.4 9.5  HGB 8.8* 9.1* 8.3*  HCT 25.8* 26.7* 24.0*  PLT 117* 176 145*    Coag's  Recent Labs Lab 11/29/15 0807 11/30/15 0341  INR 1.12 1.11    Sepsis Markers  Recent Labs Lab 11/29/15 0808 11/29/15 1136 11/30/15 1658  LATICACIDVEN 2.6* 1.3 2.3*    ABG  Recent Labs Lab 11/30/15 1615 12/01/15 0738 12/02/15 0403  PHART 7.532* 7.517* 7.508*  PCO2ART 25.9* 26.3* 28.7*  PO2ART 72.1* 72.2* 81.2    Liver Enzymes  Recent Labs Lab 11/29/15 0842 11/30/15 0341  AST 50* 33  ALT 34 30  ALKPHOS 160* 144*  BILITOT 0.6 0.7  ALBUMIN 2.2* 2.0*    Cardiac Enzymes No results for input(s): TROPONINI, PROBNP in the last 168 hours.  Glucose No results for input(s): GLUCAP in the last 168 hours.  Imaging Dg Chest [facility] 1 View  12/02/2015  CLINICAL DATA:  Respiratory failure EXAM: PORTABLE CHEST 1 VIEW COMPARISON:  11/29/2015 FINDINGS: Cardiac shadow is within normal limits. Patchy nodular changes are again seen throughout both lungs similar to that noted on prior CT examination. No focal confluent infiltrate is seen. No bony abnormality is noted. IMPRESSION: Patchy nodular changes in both lungs similar to that seen on the prior exam. Electronically Signed   By: Alcide CleverMark  Lukens M.D.   On: 12/02/2015 07:13  right basilar atx. Persistent noduler changes   STUDIES:  ECHO 3/21: EF 55-60%. mobile echodensity and thickening of the posterior tricuspid valve leaflet measuring 17 x 11 mm suspicious for tricuspid valve endocarditis and associated with moderate to severe  tricuspid regurgitation. CT chest 3/20: Negative for acute pulmonary embolism.. Numerous peripheral nodules and patchy consolidation throughout both lungs. This likely is infectious. Septic emboli is a consideration. Atypical infection is a consideration. Neoplasm is unlikely. CULTURES: UC 3/20: e coli (pan sens) Sputum 3/20:rare group b strep  BC 3/20: SA>>>  ANTIBIOTICS: Zosyn 3/20>>>off vanc  3/20>>>off Ancef 3/22>>>  SIGNIFICANT EVENTS:   LINES/TUBES:   DISCUSSION: 25 year old female admitted 3/20  w/ sepsis in setting of SA bacteremia, strep bronchitis, and e coli UTI. 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 Now w/ w/d. She has a h/o IVDA as well as other illicit drugs. Looks much better today. Has pleuritic type CP, but think that this is to be expected. Spoke w/ ID. No need for TEE given TTE results. Will need at least 1 month antibiotics for her bacteremia, then probably repeat echo to determine if needs valve replacement. This would all be assuming she stays clean.   ASSESSMENT / PLAN:  PULMONARY A: Tachypnea in setting w/d +/- sepsis -->improved Multiple pulmonary nodules Probable septic emboli  Chest pain (right pleuritic) P:   Mobilize  Add low dose toradol   CARDIOVASCULAR A:  Sepsis  P:  Cont IVFs Cont tele  See ID section   RENAL  A:   Hypomagnesemia  P:   Cont IVFs Replace Mg Avoid hypotension  Strict I&O  GASTROINTESTINAL A:   No acute but is a aspiration risk  P:   Npo for now   HEMATOLOGIC  A:   Anemia of critical illness Thrombocytopenia  P:  Trend CBC Echelon heparin  Transfuse per ICU protocol   INFECTIOUS A:   Staph bacteremia w/ endocarditis  E coli UTI  Hep C  Severe sepsis   P:   abx per ID No need for TEE at this point given d/w ID  Hep C work up per ID   ENDOCRINE A:   No acute  P:   Trend cbgs   NEUROLOGIC A:   Acute Encephalopathy  Pain P:   RASS goal: 0 Precedex gtt w/ aim to wean off  Added clonidine  patch PRN analgesia  Cont supportive care   FAMILY  - Updates: pending   - Inter-disciplinary family meet or Palliative Care meeting due by:  3/28   Simonne MartinetPeter E Colbie Danner ACNP-BC [facility] Pulmonary/Critical Care Pager # [phone] OR # [phone] if no answer  12/02/2015, 9:53 AM

## 2015-12-03 ENCOUNTER — Ambulatory Visit (HOSPITAL_COMMUNITY): Admit: 2015-12-03 | Payer: MEDICAID | Admitting: Cardiovascular Disease

## 2015-12-03 ENCOUNTER — Encounter (HOSPITAL_COMMUNITY): Payer: Self-pay

## 2015-12-03 DIAGNOSIS — R7881 Bacteremia: Secondary | ICD-10-CM

## 2015-12-03 DIAGNOSIS — B950 Streptococcus, group A, as the cause of diseases classified elsewhere: Secondary | ICD-10-CM

## 2015-12-03 DIAGNOSIS — R6520 Severe sepsis without septic shock: Secondary | ICD-10-CM

## 2015-12-03 LAB — BASIC METABOLIC PANEL
Anion gap: 8 (ref 5–15)
BUN: 9 mg/dL (ref 6–20)
CHLORIDE: 107 mmol/L (ref 101–111)
CO2: 21 mmol/L — AB (ref 22–32)
Calcium: 7.4 mg/dL — ABNORMAL LOW (ref 8.9–10.3)
Creatinine, Ser: 0.43 mg/dL — ABNORMAL LOW (ref 0.44–1.00)
GFR calc non Af Amer: >60 mL/min (ref >60)
GLUCOSE: 92 mg/dL (ref 65–99)
POTASSIUM: 3.9 mmol/L (ref 3.5–5.1)
SODIUM: 136 mmol/L (ref 135–145)

## 2015-12-03 LAB — HCV RNA QUANT RFLX ULTRA OR GENOTYP

## 2015-12-03 LAB — CBC
HEMATOCRIT: 27.2 % — AB (ref 36.0–46.0)
Hemoglobin: 9.2 g/dL — ABNORMAL LOW (ref 12.0–15.0)
MCH: 29.1 pg (ref 26.0–34.0)
MCHC: 33.8 g/dL (ref 30.0–36.0)
MCV: 86.1 fL (ref 78.0–100.0)
Platelets: 267 10*3/uL (ref 150–400)
RBC: 3.16 MIL/uL — ABNORMAL LOW (ref 3.87–5.11)
RDW: 14.1 % (ref 11.5–15.5)
WBC: 9.4 10*3/uL (ref 4.0–10.5)

## 2015-12-03 LAB — HEPATITIS C GENOTYPE

## 2015-12-03 LAB — HCV RNA (INTERNATIONAL UNITS)
HCV log10: 7.704 log10 IU/mL
Hcv Rna (International Units): 50600000 IU/mL

## 2015-12-03 LAB — MAGNESIUM: Magnesium: 1.9 mg/dL (ref 1.7–2.4)

## 2015-12-03 SURGERY — ECHOCARDIOGRAM, TRANSESOPHAGEAL
Anesthesia: Moderate Sedation

## 2015-12-03 MED ORDER — MORPHINE SULFATE (PF) 2 MG/ML IV SOLN
1.0000 mg | INTRAVENOUS | Status: DC | PRN
  Administered 2015-12-03 – 2015-12-04 (×5): 1 mg via INTRAVENOUS
  Filled 2015-12-03 (×5): qty 1

## 2015-12-03 MED ORDER — DIPHENHYDRAMINE HCL 25 MG PO CAPS
25.0000 mg | ORAL_CAPSULE | Freq: Once | ORAL | Status: AC
  Administered 2015-12-03: 25 mg via ORAL
  Filled 2015-12-03: qty 1

## 2015-12-03 MED ORDER — LORAZEPAM 0.5 MG PO TABS
0.5000 mg | ORAL_TABLET | Freq: Once | ORAL | Status: AC
  Administered 2015-12-03: 0.5 mg via ORAL
  Filled 2015-12-03: qty 1

## 2015-12-03 NOTE — NC FL2 (Signed)
[place] MEDICAID FL2 LEVEL OF CARE SCREENING TOOL     IDENTIFICATION  Patient Name: Madeline Andrade Birthdate: 07/05/1991 Sex: female Admission Date (Current Location): 11/29/2015  [facility] [place] Number:  Producer, television/film/video and Address:  [facility],  [address] [place] [address]      Provider Number: [id number]  Attending Physician Name and Address:  Calvert Cantor, MD  Relative Name and Phone Number:       Current Level of Care: Hospital Recommended Level of Care: Skilled Nursing Facility Prior Approval Number:    Date Approved/Denied:   PASRR Number: [id number]  Discharge Plan: SNF    Current Diagnoses: Patient Active Problem List   Diagnosis Date Noted  . Bacteremia due to Staphylococcus aureus- MSSA 12/03/2015  . Respiratory failure (HCC)   . Polysubstance abuse   . 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 IV drug abuse   . Hepatitis C 11/29/2015  . Sepsis (HCC) 11/29/2015  . Thrombocytopenia (HCC) 11/29/2015  . Hypokalemia 11/29/2015  . Anemia 11/29/2015  . Heroin abuse 11/29/2015    Orientation RESPIRATION BLADDER Height & Weight     Self, Time, Situation, Place  Normal Continent Weight: 59.9 kg (132 lb 0.9 oz) (rechecked weight twice and scale stated same value 59.9kg) Height:   (170.2 cm)  BEHAVIORAL SYMPTOMS/MOOD NEUROLOGICAL BOWEL NUTRITION STATUS  Other (Comment) (no behaviors)   Incontinent Diet  AMBULATORY STATUS COMMUNICATION OF NEEDS Skin   Independent Verbally Normal                       Personal Care Assistance Level of Assistance  Bathing, Feeding, Dressing Bathing Assistance: Independent Feeding assistance: Independent Dressing Assistance: Independent     Functional Limitations Info  Sight, Hearing, Speech Sight Info: Adequate Hearing Info: Adequate Speech Info: Adequate    SPECIAL CARE FACTORS FREQUENCY                       Contractures  Contractures Info: Not present    Additional Factors Info  Code Status Code Status Info: full code             Current Medications (12/03/2015):  This is the current hospital active medication list Current Facility-Administered Medications  Medication Dose Route Frequency Provider Last Rate Last Dose  . acetaminophen (TYLENOL) tablet 650 mg  650 mg Oral Q6H PRN Alberteen Sam, MD   650 mg at 12/02/15 0248   Or  . acetaminophen (TYLENOL) suppository 650 mg  650 mg Rectal Q6H PRN Alberteen Sam, MD   650 mg at 11/29/15 1055  . albuterol (PROVENTIL) (2.5 MG/3ML) 0.083% nebulizer solution 2.5 mg  2.5 mg Nebulization Q2H PRN Alberteen Sam, MD   2.5 mg at 11/29/15 0934  . ceFAZolin (ANCEF) IVPB 2g/100 mL premix  2 g Intravenous 3 times per day Ginnie Smart, MD   2 g at 12/03/15 0606  . chlorpheniramine-HYDROcodone (TUSSIONEX) 10-8 MG/5ML suspension 5 mL  5 mL Oral Q12H Calvert Cantor, MD   5 mL at 12/03/15 1025  . enoxaparin (LOVENOX) injection 40 mg  40 mg Subcutaneous Q24H Calvert Cantor, MD   40 mg at 12/03/15 1026  . HYDROcodone-acetaminophen (NORCO/VICODIN) 5-325 MG per tablet 1-2 tablet  1-2 tablet Oral Q6H PRN Calvert Cantor, MD   2 tablet at 12/03/15 0929  . ketorolac (TORADOL) 30 MG/ML injection 30 mg  30 mg Intravenous Q6H PRN  Simonne MartinetPeter E Babcock, NP   30 mg at 12/03/15 0604  . LORazepam (ATIVAN) injection 0.5 mg  0.5 mg Intravenous Q4H PRN Simonne MartinetPeter E Babcock, NP      . morphine 2 MG/ML injection 1 mg  1 mg Intravenous Q2H PRN Calvert CantorSaima Rizwan, MD      . nicotine (NICODERM CQ - dosed in mg/24 hours) patch 21 mg  21 mg Transdermal Daily Starleen Armsawood S Elgergawy, MD   21 mg at 12/03/15 1027  . ondansetron (ZOFRAN) tablet 4 mg  4 mg Oral Q6H PRN Alberteen Samhristopher P Danford, MD       Or  . ondansetron (ZOFRAN) injection 4 mg  4 mg Intravenous Q6H PRN Alberteen Samhristopher P Danford, MD      . senna-docusate (Senokot-S) tablet 1 tablet  1 tablet Oral QHS PRN Alberteen Samhristopher P Danford, MD          Discharge Medications: Please see discharge summary for a list of discharge medications.  Relevant Imaging Results:  Relevant Lab Results:   Additional Information ss # [id number].   Pt requires 4-6 weeks of IV ancef.   Devine Dant, Dickey GaveJamie Lee, LCSW

## 2015-12-03 NOTE — Progress Notes (Signed)
Cardiology will sign off will schedule a follow up.

## 2015-12-03 NOTE — Progress Notes (Signed)
INFECTIOUS DISEASE PROGRESS NOTE  ID: Madeline Andrade is a 25 y.o. female with  Principal Problem:   Sepsis (HCC) Active Problems:   Hepatitis C   Thrombocytopenia (HCC)   Hypokalemia   Anemia   Heroin abuse   Tricuspid valve vegetation   Tricuspid regurgitation   IV drug abuse   Respiratory failure (HCC)   Polysubstance abuse   Bacteremia due to Staphylococcus aureus- MSSA  Subjective: Feels better.  hemoptysis  Abtx:  Anti-infectives    Start     Dose/Rate Route Frequency Ordered Stop   12/01/15 1600  ceFAZolin (ANCEF) IVPB 2g/100 mL premix     2 g over 30 Minutes Intravenous 3 times per day 12/01/15 1450     12/01/15 0600  vancomycin (VANCOCIN) IVPB 1000 mg/200 mL premix  Status:  Discontinued     1,000 mg 200 mL/hr over 60 Minutes Intravenous Every 8 hours 11/30/15 2309 12/01/15 1450   11/30/15 2315  vancomycin (VANCOCIN) 500 mg in sodium chloride 0.9 % 100 mL IVPB     500 mg 100 mL/hr over 60 Minutes Intravenous  Once 11/30/15 2309 12/01/15 0020   11/30/15 0900  azithromycin (ZITHROMAX) 500 mg in dextrose 5 % 250 mL IVPB  Status:  Discontinued     500 mg 250 mL/hr over 60 Minutes Intravenous Every 24 hours 11/29/15 1351 11/30/15 0012   11/30/15 0800  azithromycin (ZITHROMAX) 500 mg in dextrose 5 % 250 mL IVPB  Status:  Discontinued     500 mg 250 mL/hr over 60 Minutes Intravenous Every 24 hours 11/29/15 0624 11/29/15 1351   11/30/15 0030  piperacillin-tazobactam (ZOSYN) IVPB 3.375 g  Status:  Discontinued     3.375 g 12.5 mL/hr over 240 Minutes Intravenous 3 times per day 11/30/15 0022 12/01/15 1450   11/29/15 1400  vancomycin (VANCOCIN) 500 mg in sodium chloride 0.9 % 100 mL IVPB  Status:  Discontinued     500 mg 100 mL/hr over 60 Minutes Intravenous Every 8 hours 11/29/15 0754 11/30/15 2308   11/29/15 0630  cefTRIAXone (ROCEPHIN) 1 g in dextrose 5 % 50 mL IVPB  Status:  Discontinued     1 g 100 mL/hr over 30 Minutes Intravenous  Once 11/29/15 0618 11/29/15  0624   11/29/15 0630  azithromycin (ZITHROMAX) 500 mg in dextrose 5 % 250 mL IVPB     500 mg 250 mL/hr over 60 Minutes Intravenous  Once 11/29/15 0618 11/29/15 0805   11/29/15 0630  cefTRIAXone (ROCEPHIN) 2 g in dextrose 5 % 50 mL IVPB  Status:  Discontinued     2 g 100 mL/hr over 30 Minutes Intravenous Daily 11/29/15 0624 11/30/15 0012   11/29/15 0545  vancomycin (VANCOCIN) IVPB 1000 mg/200 mL premix     1,000 mg 200 mL/hr over 60 Minutes Intravenous  Once 11/29/15 0538 11/29/15 0700   11/29/15 0545  piperacillin-tazobactam (ZOSYN) IVPB 3.375 g  Status:  Discontinued     3.375 g 12.5 mL/hr over 240 Minutes Intravenous  Once 11/29/15 0538 11/29/15 0552      Medications:  Scheduled: .  ceFAZolin (ANCEF) IV  2 g Intravenous 3 times per day  . chlorpheniramine-HYDROcodone  5 mL Oral Q12H  . enoxaparin (LOVENOX) injection  40 mg Subcutaneous Q24H  . nicotine  21 mg Transdermal Daily    Objective: Vital signs in last 24 hours: Temp:  [97.9 F (36.6 C)-99.3 F (37.4 C)] 99.1 F (37.3 C) (03/24 1426) Pulse Rate:  [79-105] 79 (03/24 1426) Resp:  [  22-30] 22 (03/24 1426) BP: (114-131)/(54-89) 119/68 mmHg (03/24 1426) SpO2:  [97 %-100 %] 100 % (03/24 1426)   General appearance: alert, cooperative and no distress Resp: clear to auscultation bilaterally Cardio: regular rate and rhythm GI: normal findings: bowel sounds normal and soft, non-tender  Lab Results  Recent Labs  12/02/15 0317 12/03/15 0315  WBC 9.5 9.4  HGB 8.3* 9.2*  HCT 24.0* 27.2*  NA 135 136  K 3.5 3.9  CL 107 107  CO2 21* 21*  BUN 11 9  CREATININE 0.45 0.43*   Liver Panel No results for input(s): PROT, ALBUMIN, AST, ALT, ALKPHOS, BILITOT, BILIDIR, IBILI in the last 72 hours. Sedimentation Rate No results for input(s): ESRSEDRATE in the last 72 hours. C-Reactive Protein No results for input(s): CRP in the last 72 hours.  Microbiology: Recent Results (from the past 240 hour(s))  Blood culture (routine  x 2)     Status: None   Collection Time: 11/29/15  5:40 AM  Result Value Ref Range Status   Specimen Description BLOOD LEFT FOREARM  Final   Special Requests BOTTLES DRAWN AEROBIC AND ANAEROBIC 5CC  Final   Culture  Setup Time   Final    GRAM POSITIVE COCCI IN CLUSTERS IN BOTH AEROBIC AND ANAEROBIC BOTTLES CRITICAL RESULT CALLED TO, READ BACK BY AND VERIFIED WITH: Concepcion Living RN 2022 11/29/15 A BROWNING    Culture   Final    STAPHYLOCOCCUS AUREUS SUSCEPTIBILITIES PERFORMED ON PREVIOUS CULTURE WITHIN THE LAST 5 DAYS. Performed at [facility]    Report Status 12/01/2015 FINAL  Final  Blood culture (routine x 2)     Status: None   Collection Time: 11/29/15  5:45 AM  Result Value Ref Range Status   Specimen Description BLOOD RIGHT FOREARM  Final   Special Requests BOTTLES DRAWN AEROBIC AND ANAEROBIC 6CC  Final   Culture  Setup Time   Final    GRAM POSITIVE COCCI IN CLUSTERS IN BOTH AEROBIC AND ANAEROBIC BOTTLES CRITICAL RESULT CALLED TO, READ BACK BY AND VERIFIED WITH: Concepcion Living RN 2022 11/29/15 A BROWNING    Culture   Final    STAPHYLOCOCCUS AUREUS Performed at [facility]    Report Status 12/01/2015 FINAL  Final   Organism ID, Bacteria STAPHYLOCOCCUS AUREUS  Final      Susceptibility   Staphylococcus aureus - MIC*    CIPROFLOXACIN <=0.5 SENSITIVE Sensitive     ERYTHROMYCIN 0.5 SENSITIVE Sensitive     GENTAMICIN <=0.5 SENSITIVE Sensitive     OXACILLIN 0.5 SENSITIVE Sensitive     TETRACYCLINE >=16 RESISTANT Resistant     VANCOMYCIN 1 SENSITIVE Sensitive     TRIMETH/SULFA <=10 SENSITIVE Sensitive     CLINDAMYCIN <=0.25 SENSITIVE Sensitive     RIFAMPIN <=0.5 SENSITIVE Sensitive     Inducible Clindamycin NEGATIVE Sensitive     * STAPHYLOCOCCUS AUREUS  Culture, Urine     Status: None   Collection Time: 11/29/15  6:18 AM  Result Value Ref Range Status   Specimen Description URINE, CLEAN CATCH  Final   Special Requests NONE  Final   Culture   Final    >=100,000  COLONIES/mL ESCHERICHIA COLI Performed at [facility]    Report Status 12/01/2015 FINAL  Final   Organism ID, Bacteria ESCHERICHIA COLI  Final      Susceptibility   Escherichia coli - MIC*    AMPICILLIN >=32 RESISTANT Resistant     CEFAZOLIN <=4 SENSITIVE Sensitive     CEFTRIAXONE <=  1 SENSITIVE Sensitive     CIPROFLOXACIN <=0.25 SENSITIVE Sensitive     GENTAMICIN <=1 SENSITIVE Sensitive     IMIPENEM <=0.25 SENSITIVE Sensitive     NITROFURANTOIN <=16 SENSITIVE Sensitive     TRIMETH/SULFA >=320 RESISTANT Resistant     AMPICILLIN/SULBACTAM >=32 RESISTANT Resistant     PIP/TAZO <=4 SENSITIVE Sensitive     * >=100,000 COLONIES/mL ESCHERICHIA COLI  Culture, sputum-assessment     Status: None   Collection Time: 11/29/15  8:22 AM  Result Value Ref Range Status   Specimen Description SPUTUM  Final   Special Requests NONE  Final   Sputum evaluation   Final    THIS SPECIMEN IS ACCEPTABLE. RESPIRATORY CULTURE REPORT TO FOLLOW.   Report Status 11/29/2015 FINAL  Final  Culture, respiratory (NON-Expectorated)     Status: None   Collection Time: 11/29/15  8:22 AM  Result Value Ref Range Status   Specimen Description SPUTUM  Final   Special Requests NONE  Final   Gram Stain   Final    ABUNDANT WBC PRESENT, PREDOMINANTLY PMN ABUNDANT SQUAMOUS EPITHELIAL CELLS PRESENT ABUNDANT GRAM POSITIVE COCCI IN PAIRS IN CLUSTERS MODERATE GRAM NEGATIVE RODS Performed at [facility]    Culture   Final    RARE GROUP A STREP (S.PYOGENES) ISOLATED Note: Beta hemolytic streptococci are predictably susceptible to penicillin and other beta lactams. Susceptibility testing not routinely performed. Performed at [facility]    Report Status 12/01/2015 FINAL  Final  MRSA PCR Screening     Status: None   Collection Time: 11/29/15 11:41 AM  Result Value Ref Range Status   MRSA by PCR NEGATIVE NEGATIVE Final    Comment:        The GeneXpert MRSA Assay (FDA approved for NASAL  specimens only), is one component of a comprehensive MRSA colonization surveillance program. It is not intended to diagnose MRSA infection nor to guide or monitor treatment for MRSA infections.   Culture, blood (routine x 2)     Status: None (Preliminary result)   Collection Time: 11/30/15 12:35 PM  Result Value Ref Range Status   Specimen Description BLOOD RIGHT ARM  Final   Special Requests BOTTLES DRAWN AEROBIC AND ANAEROBIC 10CC  Final   Culture   Final    NO GROWTH 3 DAYS Performed at [facility]    Report Status PENDING  Incomplete  Culture, blood (routine x 2)     Status: None   Collection Time: 11/30/15 12:40 PM  Result Value Ref Range Status   Specimen Description BLOOD RIGHT ARM  Final   Special Requests IN PEDIATRIC BOTTLE 1CC  Final   Culture  Setup Time   Final    GRAM POSITIVE COCCI IN CLUSTERS AEROBIC BOTTLE ONLY CRITICAL RESULT CALLED TO, READ BACK BY AND VERIFIED WITH: Drucilla Schmidt  12/01/15 MKELLY    Culture   Final    STAPHYLOCOCCUS AUREUS SUSCEPTIBILITIES PERFORMED ON PREVIOUS CULTURE WITHIN THE LAST 5 DAYS. Performed at [facility]    Report Status 12/02/2015 FINAL  Final  Culture, blood (Routine X 2) w Reflex to ID Panel     Status: None (Preliminary result)   Collection Time: 12/02/15  9:45 AM  Result Value Ref Range Status   Specimen Description BLOOD LEFT ARM  Final   Special Requests BOTTLES DRAWN AEROBIC ONLY 7CC  Final   Culture   Final    NO GROWTH 1 DAY Performed at [facility]    Report  Status PENDING  Incomplete    Studies/Results: Dg Chest Port 1 View  12/02/2015  CLINICAL DATA:  Respiratory failure EXAM: PORTABLE CHEST 1 VIEW COMPARISON:  11/29/2015 FINDINGS: Cardiac shadow is within normal limits. Patchy nodular changes are again seen throughout both lungs similar to that noted on prior CT examination. No focal confluent infiltrate is seen. No bony abnormality is noted. IMPRESSION: Patchy nodular changes  in both lungs similar to that seen on the prior exam. Electronically Signed   By: Alcide Clever M.D.   On: 12/02/2015 07:13     Assessment/Plan: Sepsis TV IE Staph bacteremia Septic emboli Off vanco Continue high dose ancef- she will need prolonged therapy- will be difficult with her ongoing drug use.today she states that she has detox'ed and she is going to remain clean.  I would NOT send her home with a PIC- she will need placement to complete her therapy. 4-6 weeks of Ancef Recheck BCx 3-23 is ngtd, repeat on 3-21 is positive 1/3  Sputum Cx Gram stain GNR and GPC, many SE.  Cx shows rare GAS  Hepatitis C 1a VL 50 million She will not be treatment candidate until she is clean.   IVDA HIV (-) UDS+ cocaine, opiates, Benzo Withdrawal precautions  Protein Albumin Malnutrition, severe Nutrition eval  E coli UTI On ancef   Atbx- day 4 (ancef)       Johny Sax Infectious Diseases (pager) [phone] [email or web address] 12/03/2015, 4:52 PM  LOS: 4 days

## 2015-12-03 NOTE — Progress Notes (Signed)
[facility] Progress Note   Madeline Andrade Agent  WUJ:[id number]  DOB: [birth year]  DOA: 11/29/2015 PCP: No PCP Per Patient  Brief narrative: Madeline Andrade is a 25 y.o. female past medical history significant forIV drug abuse who presents with chest pain and cough, CTA chest significant for multiple pulmonary septic emboli- bacteremic with Staphylococcus aureus. TTE reveals vegetation on tricuspid valve with mod to severe regurg.   Subjective: Cough and chest pain have improved today. No nausea, vomiting, diarrhea, constipation. Eating well. Voiding a great deal. Discussed the cause of her illness and plan in detail with mother present.  Becomes tearful about her drug use and remorseful. States she has no reason to abuse anymore.  Assessment/Plan: Sepsis - MSSA bacteremiawith septic pulm emboli and tricuspid endocarditis - in the setting of IV drug abuse. 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-= patient and mother in agreement- Social services called - agree with Toradol for chest pain  UTI - E coli-  senstive to all antibiotics she has been on so far  Polysubstance abuse with IV drug abuse - in withdrawal- PCCM assisting with management - still quite tachycardic despite the fact that she is adequately hydrated and pain free  Hypokalemia - Repleted   Hypomagnesemia - repleted    Thrombocytopenia:  - No evidence of active bleed, likely due to sepsis   Anemia - drop in Hb from 11.4 on admission to 8-9 range could be due to acute illness - anemia panel suggest  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  Hepatitis C - ID checking genotype and viral load- treat when drug abuse issues resolved  Nictone abuse - Nicotine patch    Antibiotics: Anti-infectives    Start     Dose/Rate Route Frequency Ordered Stop   12/01/15 1600  ceFAZolin (ANCEF) IVPB 2g/100 mL premix     2 g over 30 Minutes Intravenous 3 times per day 12/01/15 1450     12/01/15 0600  vancomycin (VANCOCIN) IVPB 1000 mg/200 mL premix  Status:  Discontinued     1,000 mg 200 mL/hr over 60 Minutes Intravenous Every 8 hours 11/30/15 2309 12/01/15 1450   11/30/15 2315  vancomycin (VANCOCIN) 500 mg in sodium chloride 0.9 % 100 mL IVPB     500 mg 100 mL/hr over 60 Minutes Intravenous  Once 11/30/15 2309 12/01/15 0020   11/30/15 0900  azithromycin (ZITHROMAX) 500 mg in dextrose 5 % 250 mL IVPB  Status:  Discontinued     500 mg 250 mL/hr over 60 Minutes Intravenous Every 24 hours 11/29/15 1351 11/30/15 0012   11/30/15 0800  azithromycin (ZITHROMAX) 500 mg in dextrose 5 % 250 mL IVPB  Status:  Discontinued     500 mg 250 mL/hr over 60 Minutes Intravenous Every 24 hours 11/29/15 0624 11/29/15 1351   11/30/15 0030  piperacillin-tazobactam (ZOSYN) IVPB 3.375 g  Status:  Discontinued     3.375 g 12.5 mL/hr over 240 Minutes Intravenous 3 times per day 11/30/15 0022 12/01/15 1450   11/29/15 1400  vancomycin (VANCOCIN) 500 mg in sodium chloride 0.9 % 100 mL IVPB  Status:  Discontinued  500 mg 100 mL/hr over 60 Minutes Intravenous Every 8 hours 11/29/15 0754 11/30/15 2308   11/29/15 0630  cefTRIAXone (ROCEPHIN) 1 g in dextrose 5 % 50 mL IVPB  Status:  Discontinued     1 g 100 mL/hr over 30 Minutes Intravenous  Once 11/29/15 0618 11/29/15 0624   11/29/15 0630  azithromycin (ZITHROMAX) 500 mg in dextrose 5 % 250 mL IVPB     500 mg 250 mL/hr over 60 Minutes Intravenous  Once 11/29/15 0618 11/29/15 0805   11/29/15 0630  cefTRIAXone (ROCEPHIN) 2 g in dextrose 5 % 50 mL IVPB  Status:  Discontinued     2  g 100 mL/hr over 30 Minutes Intravenous Daily 11/29/15 0624 11/30/15 0012   11/29/15 0545  vancomycin (VANCOCIN) IVPB 1000 mg/200 mL premix     1,000 mg 200 mL/hr over 60 Minutes Intravenous  Once 11/29/15 0538 11/29/15 0700   11/29/15 0545  piperacillin-tazobactam (ZOSYN) IVPB 3.375 g  Status:  Discontinued     3.375 g 12.5 mL/hr over 240 Minutes Intravenous  Once 11/29/15 0538 11/29/15 0552     Code Status:     Code Status Orders        Start     Ordered   11/29/15 0747  Full code   Continuous     11/29/15 0746    Code Status History    Date Active Date Inactive Code Status Order ID Comments User Context   This patient has a current code status but no historical code status.     Family Communication:Mother Ms [place]  [phone]) Disposition Plan: transfer to telemetry and to SNF when stable DVT prophylaxis: Lovenox Consultants: PCCm, ID, cardiology Procedures:  TTE 3/21 There is a mobile echodensity and thickening of the posterior  tricuspid valve leaflet measuring 17 x 11 mm suspicious for  tricuspid valve endocarditis and associated with moderate to  severe tricuspid regurgitation.  Further evaluation with TEE is recommended.   Objective: Filed Weights   11/29/15 0745 12/02/15 0400  Weight: 54.432 kg (120 lb) 59.9 kg (132 lb 0.9 oz)    Intake/Output Summary (Last 24 hours) at 12/03/15 0841 Last data filed at 12/03/15 0606  Gross per 24 hour  Intake    950 ml  Output   4650 ml  Net  -3700 ml     Vitals Filed Vitals:   12/03/15 0400 12/03/15 0500 12/03/15 0600 12/03/15 0803  BP: 128/71   119/89  Pulse: 92 97 88 97  Temp: 99.3 F (37.4 C) 99.3 F (37.4 C) 99.3 F (37.4 C)   TempSrc:      Resp: Height:      Weight:      SpO2: 99% 100% 99% 100%    Exam:  General:  Pt is alert,  Distressed when coughing  HEENT: No icterus, No thrush, oral mucosa moist  Cardiovascular: regular rate and rhythm, S1/S2 No murmur-  tachydardic  Respiratory: CTA b/l   Abdomen: Soft, +Bowel sounds, non tender, non distended, no guarding  MSK: No cyanosis or clubbing- no pedal edema   Data Reviewed: Basic Metabolic Panel:  Recent Labs Lab 11/29/15 0357 11/29/15 0842 11/30/15 0341 12/01/15 0321 12/02/15 0317 12/03/15 0315  NA 137  --  140 137 135 136  K 2.9*  --  3.3* 4.7 3.5 3.9  CL 96*  --  108 108 107 107  CO2 30  --  24 21* 21* 21*  GLUCOSE 147*  --  129* 125* 104* 92  BUN 10  --  11 13 11 9   CREATININE 0.48  --  0.46 0.51 0.45 0.43*  CALCIUM 8.1*  --  7.4* 7.9* 7.7* 7.4*  MG  --  1.5* 1.7  --  1.4* 1.9  PHOS  --   --   --  4.0 3.1  --    Liver Function Tests:  Recent Labs Lab 11/29/15 0842 11/30/15 0341  AST 50* 33  ALT 34 30  ALKPHOS 160* 144*  BILITOT 0.6 0.7  PROT 5.8* 5.4*  ALBUMIN 2.2* 2.0*   No results for input(s): LIPASE, AMYLASE in the last 168 hours. No results for input(s): AMMONIA in the last 168 hours. CBC:  Recent Labs Lab 11/29/15 0357 11/30/15 0341 12/01/15 0321 12/02/15 0317 12/03/15 0315  WBC 7.4 9.5 10.4 9.5 9.4  NEUTROABS 6.0  --   --   --   --   HGB 11.4* 8.8* 9.1* 8.3* 9.2*  HCT 34.9* 25.8* 26.7* 24.0* 27.2*  MCV 87.7 83.0 86.7 82.5 86.1  PLT 87* 117* 176 145* 267   Cardiac Enzymes: No results for input(s): CKTOTAL, CKMB, CKMBINDEX, TROPONINI in the last 168 hours. BNP (last 3 results) No results for input(s): BNP in the last 8760 hours.  ProBNP (last 3 results) No results for input(s): PROBNP in the last 8760 hours.  CBG: No results for input(s): GLUCAP in the last 168 hours.  Recent Results (from the past 240 hour(s))  Blood culture (routine x 2)     Status: None   Collection Time: 11/29/15  5:40 AM  Result Value Ref Range Status   Specimen Description BLOOD LEFT FOREARM  Final   Special Requests BOTTLES DRAWN AEROBIC AND ANAEROBIC 5CC  Final   Culture  Setup Time   Final    GRAM POSITIVE COCCI IN CLUSTERS IN BOTH AEROBIC AND ANAEROBIC  BOTTLES CRITICAL RESULT CALLED TO, READ BACK BY AND VERIFIED WITH: Concepcion Living RN 2022 11/29/15 A BROWNING    Culture   Final    STAPHYLOCOCCUS AUREUS SUSCEPTIBILITIES PERFORMED ON PREVIOUS CULTURE WITHIN THE LAST 5 DAYS. Performed at [facility]    Report Status 12/01/2015 FINAL  Final  Blood culture (routine x 2)     Status: None   Collection Time: 11/29/15  5:45 AM  Result Value Ref Range Status   Specimen Description BLOOD RIGHT FOREARM  Final   Special Requests BOTTLES DRAWN AEROBIC AND ANAEROBIC 6CC  Final   Culture  Setup Time   Final    GRAM POSITIVE COCCI IN CLUSTERS IN BOTH AEROBIC AND ANAEROBIC BOTTLES CRITICAL RESULT CALLED TO, READ BACK BY AND VERIFIED WITH: Concepcion Living RN 2022 11/29/15 A BROWNING    Culture   Final    STAPHYLOCOCCUS AUREUS Performed at [facility]    Report Status 12/01/2015 FINAL  Final   Organism ID, Bacteria STAPHYLOCOCCUS AUREUS  Final      Susceptibility   Staphylococcus aureus - MIC*    CIPROFLOXACIN <=0.5 SENSITIVE Sensitive     ERYTHROMYCIN 0.5 SENSITIVE Sensitive     GENTAMICIN <=0.5 SENSITIVE Sensitive     OXACILLIN 0.5 SENSITIVE Sensitive     TETRACYCLINE >=16 RESISTANT Resistant     VANCOMYCIN 1 SENSITIVE Sensitive     TRIMETH/SULFA <=10 SENSITIVE Sensitive     CLINDAMYCIN <=0.25 SENSITIVE Sensitive     RIFAMPIN <=0.5 SENSITIVE Sensitive     Inducible Clindamycin NEGATIVE Sensitive     * STAPHYLOCOCCUS AUREUS  Culture, Urine  Status: None   Collection Time: 11/29/15  6:18 AM  Result Value Ref Range Status   Specimen Description URINE, CLEAN CATCH  Final   Special Requests NONE  Final   Culture   Final    >=100,000 COLONIES/mL ESCHERICHIA COLI Performed at [facility]    Report Status 12/01/2015 FINAL  Final   Organism ID, Bacteria ESCHERICHIA COLI  Final      Susceptibility   Escherichia coli - MIC*    AMPICILLIN >=32 RESISTANT Resistant     CEFAZOLIN <=4 SENSITIVE Sensitive     CEFTRIAXONE <=1  SENSITIVE Sensitive     CIPROFLOXACIN <=0.25 SENSITIVE Sensitive     GENTAMICIN <=1 SENSITIVE Sensitive     IMIPENEM <=0.25 SENSITIVE Sensitive     NITROFURANTOIN <=16 SENSITIVE Sensitive     TRIMETH/SULFA >=320 RESISTANT Resistant     AMPICILLIN/SULBACTAM >=32 RESISTANT Resistant     PIP/TAZO <=4 SENSITIVE Sensitive     * >=100,000 COLONIES/mL ESCHERICHIA COLI  Culture, sputum-assessment     Status: None   Collection Time: 11/29/15  8:22 AM  Result Value Ref Range Status   Specimen Description SPUTUM  Final   Special Requests NONE  Final   Sputum evaluation   Final    THIS SPECIMEN IS ACCEPTABLE. RESPIRATORY CULTURE REPORT TO FOLLOW.   Report Status 11/29/2015 FINAL  Final  Culture, respiratory (NON-Expectorated)     Status: None   Collection Time: 11/29/15  8:22 AM  Result Value Ref Range Status   Specimen Description SPUTUM  Final   Special Requests NONE  Final   Gram Stain   Final    ABUNDANT WBC PRESENT, PREDOMINANTLY PMN ABUNDANT SQUAMOUS EPITHELIAL CELLS PRESENT ABUNDANT GRAM POSITIVE COCCI IN PAIRS IN CLUSTERS MODERATE GRAM NEGATIVE RODS Performed at [facility]    Culture   Final    RARE GROUP A STREP (S.PYOGENES) ISOLATED Note: Beta hemolytic streptococci are predictably susceptible to penicillin and other beta lactams. Susceptibility testing not routinely performed. Performed at [facility]    Report Status 12/01/2015 FINAL  Final  MRSA PCR Screening     Status: None   Collection Time: 11/29/15 11:41 AM  Result Value Ref Range Status   MRSA by PCR NEGATIVE NEGATIVE Final    Comment:        The GeneXpert MRSA Assay (FDA approved for NASAL specimens only), is one component of a comprehensive MRSA colonization surveillance program. It is not intended to diagnose MRSA infection nor to guide or monitor treatment for MRSA infections.   Culture, blood (routine x 2)     Status: None (Preliminary result)   Collection Time: 11/30/15 12:35 PM   Result Value Ref Range Status   Specimen Description BLOOD RIGHT ARM  Final   Special Requests BOTTLES DRAWN AEROBIC AND ANAEROBIC 10CC  Final   Culture   Final    NO GROWTH 2 DAYS Performed at [facility]    Report Status PENDING  Incomplete  Culture, blood (routine x 2)     Status: None   Collection Time: 11/30/15 12:40 PM  Result Value Ref Range Status   Specimen Description BLOOD RIGHT ARM  Final   Special Requests IN PEDIATRIC BOTTLE 1CC  Final   Culture  Setup Time   Final    GRAM POSITIVE COCCI IN CLUSTERS AEROBIC BOTTLE ONLY CRITICAL RESULT CALLED TO, READ BACK BY AND VERIFIED WITH: Drucilla Schmidt @0559  12/01/15 MKELLY    Culture   Final  STAPHYLOCOCCUS AUREUS SUSCEPTIBILITIES PERFORMED ON PREVIOUS CULTURE WITHIN THE LAST 5 DAYS. [facility]    Report Status 12/02/2015 FINAL  Final     Studies: Dg Chest Port 1 View  12/02/2015  CLINICAL DATA:  Respiratory failure EXAM: PORTABLE CHEST 1 VIEW COMPARISON:  11/29/2015 FINDINGS: Cardiac shadow is within normal limits. Patchy nodular changes are again seen throughout both lungs similar to that noted on prior CT examination. No focal confluent infiltrate is seen. No bony abnormality is noted. IMPRESSION: Patchy nodular changes in both lungs similar to that seen on the prior exam. Electronically Signed   By: Alcide CleverMark  Lukens M.D.   On: 12/02/2015 07:13    Scheduled Meds:  Scheduled Meds: .  ceFAZolin (ANCEF) IV  2 g Intravenous 3 times per day  . chlorpheniramine-HYDROcodone  5 mL Oral Q12H  . enoxaparin (LOVENOX) injection  40 mg Subcutaneous Q24H  . nicotine  21 mg Transdermal Daily   Continuous Infusions:    Time spent on care of this patient: 35 min   Kima Malenfant, MD 12/03/2015, 8:41 AM  LOS: 4 days   [facility] Office  [phone] Pager - Text Page per [email or web address] If 7PM-7AM, please contact night-coverage [email or web address]

## 2015-12-03 NOTE — Clinical Social Work Note (Signed)
Clinical Social Work Assessment  Patient Details  Name: Madeline Andrade MRN: [id number] Date of Birth: 06-06-1991  Date of referral:  12/03/15               Reason for consult:  Facility Placement, Discharge Planning                Permission sought to share information with:  Facility Art therapist granted to share information::  Yes, Verbal Permission Granted  Name::        Agency::     Relationship::     Contact Information:     Housing/Transportation Living arrangements for the past 2 months:  Apartment Source of Information:  Patient Patient Interpreter Needed:  None Criminal Activity/Legal Involvement Pertinent to Current Situation/Hospitalization:  No - Comment as needed Significant Relationships:  Parents Lives with:    Do you feel safe going back to the place where you live?  No Need for family participation in patient care:  Yes (Comment)  Care giving concerns:  Pt cannot return home with PICC line due to IV drug use.   Social Worker assessment / plan:  Pt hospitalized on 11/29/15 with sepsis. CSW met with pt / mother Ronnald Nian [phone] ) to assist with d/c planning. Pt will require PICC line for long term antibiotics at d/c. Due to pt's IV drug abuse pt cannot be d/c home for IV treatment. Pt / mother are in agreement with plan for SNF. Pt has no insurance. CSW will assist locating a facility that will accept [facility] for SNF placement.  Employment status:  Unemployed Forensic scientist:  Self Pay (Medicaid Pending) PT Recommendations:  Not assessed at this time Information / Referral to community resources:  [place]  Patient/Family's Response to care:  Pt / mother agree with plan for SNF placement.  Patient/Family's Understanding of and Emotional Response to Diagnosis, Current Treatment, and Prognosis:  Pt / mother are aware of pt's medical status. Both are hoping for placement close to home but understand that this may  not be possible. Pt is interested in staying off drugs. Residential SA treatment placement may be something pt will consider following d/c from SNF.  Emotional Assessment Appearance:  Appears stated age Attitude/Demeanor/Rapport:  Other (cooperative) Affect (typically observed):  Calm, Appropriate Orientation:  Oriented to Self, Oriented to Place, Oriented to  Time, Oriented to Situation Alcohol / Substance use:  Illicit Drugs Psych involvement (Current and /or in the community):  No (Comment)  Discharge Needs  Concerns to be addressed:  Discharge Planning Concerns Readmission within the last 30 days:  No Current discharge risk:  Substance Abuse, Inadequate Financial Supports Barriers to Discharge:  Active Substance Use, Inadequate or no insurance   Loraine Maple  [phone] 12/03/2015, 10:34 AM

## 2015-12-04 ENCOUNTER — Encounter (HOSPITAL_COMMUNITY): Payer: Self-pay

## 2015-12-04 ENCOUNTER — Inpatient Hospital Stay (HOSPITAL_COMMUNITY)
Admission: EM | Admit: 2015-12-04 | Discharge: 2015-12-05 | DRG: 871 | Discharge status: Against Medical Advice | Payer: MEDICAID | Attending: Internal Medicine | Admitting: Internal Medicine

## 2015-12-04 DIAGNOSIS — E876 Hypokalemia: Secondary | ICD-10-CM | POA: Diagnosis present

## 2015-12-04 DIAGNOSIS — F1410 Cocaine abuse, uncomplicated: Secondary | ICD-10-CM | POA: Diagnosis present

## 2015-12-04 DIAGNOSIS — D638 Anemia in other chronic diseases classified elsewhere: Secondary | ICD-10-CM | POA: Diagnosis present

## 2015-12-04 DIAGNOSIS — I38 Endocarditis, valve unspecified: Secondary | ICD-10-CM | POA: Diagnosis present

## 2015-12-04 DIAGNOSIS — N390 Urinary tract infection, site not specified: Secondary | ICD-10-CM | POA: Diagnosis present

## 2015-12-04 DIAGNOSIS — D649 Anemia, unspecified: Secondary | ICD-10-CM | POA: Diagnosis present

## 2015-12-04 DIAGNOSIS — I079 Rheumatic tricuspid valve disease, unspecified: Secondary | ICD-10-CM | POA: Diagnosis present

## 2015-12-04 DIAGNOSIS — B9620 Unspecified Escherichia coli [E. coli] as the cause of diseases classified elsewhere: Secondary | ICD-10-CM | POA: Diagnosis present

## 2015-12-04 DIAGNOSIS — B9561 Methicillin susceptible Staphylococcus aureus infection as the cause of diseases classified elsewhere: Secondary | ICD-10-CM

## 2015-12-04 DIAGNOSIS — D696 Thrombocytopenia, unspecified: Secondary | ICD-10-CM | POA: Diagnosis present

## 2015-12-04 DIAGNOSIS — A4101 Sepsis due to Methicillin susceptible Staphylococcus aureus: Principal | ICD-10-CM | POA: Diagnosis present

## 2015-12-04 DIAGNOSIS — F17210 Nicotine dependence, cigarettes, uncomplicated: Secondary | ICD-10-CM | POA: Diagnosis present

## 2015-12-04 DIAGNOSIS — R7881 Bacteremia: Secondary | ICD-10-CM | POA: Diagnosis present

## 2015-12-04 DIAGNOSIS — F1110 Opioid abuse, uncomplicated: Secondary | ICD-10-CM | POA: Diagnosis present

## 2015-12-04 DIAGNOSIS — I2690 Septic pulmonary embolism without acute cor pulmonale: Secondary | ICD-10-CM | POA: Diagnosis present

## 2015-12-04 DIAGNOSIS — F1910 Other psychoactive substance abuse, uncomplicated: Secondary | ICD-10-CM | POA: Diagnosis present

## 2015-12-04 DIAGNOSIS — B1920 Unspecified viral hepatitis C without hepatic coma: Secondary | ICD-10-CM | POA: Diagnosis present

## 2015-12-04 LAB — COMPREHENSIVE METABOLIC PANEL
ALBUMIN: 2.5 g/dL — AB (ref 3.5–5.0)
ALT: 30 U/L (ref 14–54)
AST: 30 U/L (ref 15–41)
Alkaline Phosphatase: 119 U/L (ref 38–126)
Anion gap: 9 (ref 5–15)
BILIRUBIN TOTAL: 0.4 mg/dL (ref 0.3–1.2)
BUN: 8 mg/dL (ref 6–20)
CO2: 23 mmol/L (ref 22–32)
CREATININE: 0.51 mg/dL (ref 0.44–1.00)
Calcium: 8.2 mg/dL — ABNORMAL LOW (ref 8.9–10.3)
Chloride: 104 mmol/L (ref 101–111)
Glucose, Bld: 102 mg/dL — ABNORMAL HIGH (ref 65–99)
POTASSIUM: 3.8 mmol/L (ref 3.5–5.1)
SODIUM: 136 mmol/L (ref 135–145)
TOTAL PROTEIN: 7.1 g/dL (ref 6.5–8.1)

## 2015-12-04 LAB — CBC WITH DIFFERENTIAL/PLATELET
Basophils Absolute: 0 10*3/uL (ref 0.0–0.1)
Basophils Relative: 0 %
Eosinophils Absolute: 0 10*3/uL (ref 0.0–0.7)
Eosinophils Relative: 0 %
HCT: 30.6 % — ABNORMAL LOW (ref 36.0–46.0)
Hemoglobin: 10 g/dL — ABNORMAL LOW (ref 12.0–15.0)
Lymphocytes Relative: 10 %
Lymphs Abs: 1.5 10*3/uL (ref 0.7–4.0)
MCH: 28 pg (ref 26.0–34.0)
MCHC: 32.7 g/dL (ref 30.0–36.0)
MCV: 85.7 fL (ref 78.0–100.0)
Monocytes Absolute: 0.7 10*3/uL (ref 0.1–1.0)
Monocytes Relative: 5 %
Neutro Abs: 12.4 10*3/uL — ABNORMAL HIGH (ref 1.7–7.7)
Neutrophils Relative %: 85 %
Platelets: 413 10*3/uL — ABNORMAL HIGH (ref 150–400)
RBC: 3.57 MIL/uL — ABNORMAL LOW (ref 3.87–5.11)
RDW: 13.9 % (ref 11.5–15.5)
WBC: 14.6 10*3/uL — ABNORMAL HIGH (ref 4.0–10.5)

## 2015-12-04 LAB — CBC
HCT: 27.4 % — ABNORMAL LOW (ref 36.0–46.0)
Hemoglobin: 9.1 g/dL — ABNORMAL LOW (ref 12.0–15.0)
MCH: 29.1 pg (ref 26.0–34.0)
MCHC: 33.2 g/dL (ref 30.0–36.0)
MCV: 87.5 fL (ref 78.0–100.0)
Platelets: 350 10*3/uL (ref 150–400)
RBC: 3.13 MIL/uL — ABNORMAL LOW (ref 3.87–5.11)
RDW: 14.3 % (ref 11.5–15.5)
WBC: 10.9 10*3/uL — ABNORMAL HIGH (ref 4.0–10.5)

## 2015-12-04 LAB — BASIC METABOLIC PANEL
Anion gap: 9 (ref 5–15)
BUN: 8 mg/dL (ref 6–20)
CO2: 23 mmol/L (ref 22–32)
Calcium: 8 mg/dL — ABNORMAL LOW (ref 8.9–10.3)
Chloride: 106 mmol/L (ref 101–111)
Creatinine, Ser: 0.48 mg/dL (ref 0.44–1.00)
GFR calc Af Amer: >60 mL/min (ref >60)
GFR calc non Af Amer: >60 mL/min (ref >60)
Glucose, Bld: 102 mg/dL — ABNORMAL HIGH (ref 65–99)
Potassium: 3.6 mmol/L (ref 3.5–5.1)
Sodium: 138 mmol/L (ref 135–145)

## 2015-12-04 LAB — RAPID URINE DRUG SCREEN, HOSP PERFORMED
Amphetamines: NOT DETECTED
Barbiturates: NOT DETECTED
Benzodiazepines: NOT DETECTED
COCAINE: POSITIVE — AB
OPIATES: POSITIVE — AB
Tetrahydrocannabinol: NOT DETECTED

## 2015-12-04 LAB — MAGNESIUM: MAGNESIUM: 1.6 mg/dL — AB (ref 1.7–2.4)

## 2015-12-04 MED ORDER — KETOROLAC TROMETHAMINE 15 MG/ML IJ SOLN
15.0000 mg | Freq: Four times a day (QID) | INTRAMUSCULAR | Status: DC | PRN
  Administered 2015-12-04: 15 mg via INTRAVENOUS
  Filled 2015-12-04: qty 1

## 2015-12-04 MED ORDER — GUAIFENESIN-DM 100-10 MG/5ML PO SYRP
5.0000 mL | ORAL_SOLUTION | ORAL | Status: DC | PRN
  Administered 2015-12-04: 5 mL via ORAL
  Filled 2015-12-04: qty 10

## 2015-12-04 MED ORDER — KETAMINE HCL-SODIUM CHLORIDE 100-0.9 MG/10ML-% IV SOSY
0.3000 mg/kg | PREFILLED_SYRINGE | Freq: Once | INTRAVENOUS | Status: DC
Start: 2015-12-04 — End: 2015-12-04

## 2015-12-04 MED ORDER — ONDANSETRON HCL 4 MG/2ML IJ SOLN: 4.0000 mg | Freq: Four times a day (QID) | INTRAMUSCULAR | Status: DC | PRN

## 2015-12-04 MED ORDER — KETAMINE HCL-SODIUM CHLORIDE 100-0.9 MG/10ML-% IV SOSY
15.0000 mg | PREFILLED_SYRINGE | Freq: Once | INTRAVENOUS | Status: AC
  Administered 2015-12-04: 15 mg via INTRAVENOUS
  Filled 2015-12-04: qty 10

## 2015-12-04 MED ORDER — ZOLPIDEM TARTRATE 5 MG PO TABS
5.0000 mg | ORAL_TABLET | Freq: Once | ORAL | Status: AC
Start: 2015-12-04 — End: 2015-12-04
  Administered 2015-12-04: 5 mg via ORAL
  Filled 2015-12-04: qty 1

## 2015-12-04 MED ORDER — MAGNESIUM SULFATE 2 GM/50ML IV SOLN: 2.0000 g | Freq: Once | INTRAVENOUS | Status: DC

## 2015-12-04 MED ORDER — KETOROLAC TROMETHAMINE 30 MG/ML IJ SOLN
30.0000 mg | Freq: Four times a day (QID) | INTRAMUSCULAR | Status: DC | PRN
  Administered 2015-12-05: 30 mg via INTRAVENOUS
  Filled 2015-12-04: qty 1

## 2015-12-04 MED ORDER — LACTATED RINGERS IV BOLUS (SEPSIS)
2000.0000 mL | Freq: Once | INTRAVENOUS | Status: AC
  Administered 2015-12-04: 2000 mL via INTRAVENOUS

## 2015-12-04 MED ORDER — CEFAZOLIN SODIUM-DEXTROSE 2-4 GM/100ML-% IV SOLN
2.0000 g | Freq: Three times a day (TID) | INTRAVENOUS | Status: DC
  Administered 2015-12-04 (×2): 2 g via INTRAVENOUS
  Filled 2015-12-04 (×4): qty 100

## 2015-12-04 MED ORDER — IBUPROFEN 200 MG PO TABS: 400.0000 mg | ORAL_TABLET | Freq: Four times a day (QID) | ORAL | Status: DC | PRN

## 2015-12-04 MED ORDER — ACETAMINOPHEN 650 MG RE SUPP: 650.0000 mg | Freq: Four times a day (QID) | RECTAL | Status: DC | PRN

## 2015-12-04 MED ORDER — ACETAMINOPHEN 325 MG PO TABS
650.0000 mg | ORAL_TABLET | Freq: Four times a day (QID) | ORAL | Status: DC | PRN
  Administered 2015-12-04: 650 mg via ORAL
  Filled 2015-12-04: qty 2

## 2015-12-04 MED ORDER — ACETAMINOPHEN 500 MG PO TABS
1000.0000 mg | ORAL_TABLET | Freq: Once | ORAL | Status: AC
  Administered 2015-12-04: 1000 mg via ORAL
  Filled 2015-12-04: qty 2

## 2015-12-04 MED ORDER — ONDANSETRON HCL 4 MG PO TABS: 4.0000 mg | ORAL_TABLET | Freq: Four times a day (QID) | ORAL | Status: DC | PRN

## 2015-12-04 MED ORDER — NICOTINE 14 MG/24HR TD PT24
14.0000 mg | MEDICATED_PATCH | Freq: Every day | TRANSDERMAL | Status: DC
  Administered 2015-12-04: 14 mg via TRANSDERMAL
  Filled 2015-12-04: qty 1

## 2015-12-04 MED ORDER — KETOROLAC TROMETHAMINE 30 MG/ML IJ SOLN
30.0000 mg | Freq: Once | INTRAMUSCULAR | Status: AC
  Administered 2015-12-04: 30 mg via INTRAVENOUS
  Filled 2015-12-04: qty 1

## 2015-12-04 MED ORDER — ENOXAPARIN SODIUM 40 MG/0.4ML ~~LOC~~ SOLN
40.0000 mg | Freq: Every day | SUBCUTANEOUS | Status: DC
  Administered 2015-12-04: 40 mg via SUBCUTANEOUS
  Filled 2015-12-04: qty 0.4

## 2015-12-04 NOTE — Progress Notes (Signed)
Patient had been walking in the hall this morning. The last time RN saw the patient was round 0845. CMT called RN to say leads were off. RN went to room to replace monitor and the telemetry box and gown were on the bed. RN searched the unit for patient but did not find her. About 10 minutes later, searched the unit again and still did not find her. Charge RN checked lobby and made security aware and gave description of patient. Security stated ED staff member had seen a female fitting the patient's description getting onto a bus. Security went to bus stop and patient was not present. Also searched grounds with no success. The patient left with 2 IV sites accessible. MD and patient's mother made aware. Julio SicksK. Pamalee Marcoe RN

## 2015-12-04 NOTE — Progress Notes (Signed)
Pharmacy Antibiotic Note  Madeline Andrade is a 25 y.o. female with hx polysubstance abuse and hepatitis C with MSSA bacteremia and endocarditis with septic emboli being treated with cefazolin day #5.  ID recommends to treat for 4-6 weeks.  Patient wandered off unit this morning and left AMA on 3/25.  Patient's now readmitted.  Of note, last ancef 2gm dose was given at 0600 this morning before she left AMA.  Plan: - ancef 2 gm IV q8h - patient has good renal function and current ancef dose is appropriate for indication.  Will sign off. - Re-consult us if need further assistance  ___________________  Temp (24hrs), Avg:99.1 F (37.3 C), Min:98.7 F (37.1 C), Max:99.4 F (37.4 C)   Recent Labs Lab 11/29/15 0808 11/29/15 1136  11/30/15 1658 11/30/15 2123 12/01/15 0321 12/02/15 0317 12/03/15 0315 12/04/15 0504 12/04/15 1209  WBC  --   --   < >  --   --  10.4 9.5 9.4 10.9* 14.6*  CREATININE  --   --   < >  --   --  0.51 0.45 0.43* 0.48 0.51  LATICACIDVEN 2.6* 1.3  --  2.3*  --   --   --   --   --   --   VANCOTROUGH  --   --   --   --  4*  --   --   --   --   --   < > = values in this interval not displayed.  Estimated Creatinine Clearance: 99.9 mL/min (by C-G formula based on Cr of 0.51).    No Known Allergies  Antimicrobials this admission: Azithromycin 3/20 >> 3/20 Ceftriaxone 3/20 >> 3/20 Vancomycin 3/20 >> 3/22 Zosyn 3/21 >> 3/22 Ancef 3/22 >>  Levels/dose changes this admission: 3/21 2100 VT= 4 on 500mg  iv q8hr--doses charted correctly  Microbiology results: 3/25 bcx x2: 3/26 bcx:  3/21 bcx x2: 1/2 staph aureus 3/20 MRSA PCR (-) 3/20 sputum: rate group A strep FINAL  3/20 ucx: >100K Ecoli (S=ancef, CTX, cipro, gent, primaxin, nitrof, zosyn) FINAL 3/20 bcx x2 :  MSSA FINAL  Thank you for allowing pharmacy to be a part of this patient's care.  Lucia Gaskinsham, Tiandre Teall P 12/04/2015 3:14 PM

## 2015-12-04 NOTE — ED Notes (Addendum)
Pt was seen in parking lot earlier today.  Pt left from 4th floor admission. Pt states she has blood infection and doesn't know why she left.  States she "just didn't want to be here.  States she has "blood infection".  Pt was found earlier to have IV in arm.  IV is out and pt states she removed it.  Pt has hx of drug abuse but denies using any drugs.  Pt states abdominal pain.

## 2015-12-04 NOTE — Progress Notes (Addendum)
[facility] Progress Note   Madeline Andrade  ZOX:[id number]  DOB: 11-Oct-1990  DOA: 12/04/2015 PCP: No PCP Per Patient  Brief narrative: Madeline Andrade is a 25 y.o. female past medical history significant forIV drug abuse who presents with chest pain and cough, CTA chest significant for multiple pulmonary septic emboli- bacteremic with Staphylococcus aureus. TTE reveals vegetation on tricuspid valve with mod to severe regurg. She left AMA earlier today and was gone for 3 hrs. She was found be security outside the hospital and brought back. I have readmitted her to the hospital.   Subjective: Declined using drugs today. She had taken out her IVs. Tells me she got scared which is why she left.   Assessment/Plan: Sepsis - MSSA bacteremiawith septic pulm emboli and tricuspid endocarditis - in the setting of IV drug abuse. 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-= patient and mother in agreement- Social services called - agree with Toradol for chest pain- no narcotics  UTI - E coli-  senstive to all antibiotics she has been on so far  Polysubstance abuse with IV drug abuse - went through withdrawal in ICU- PCCM assisting with management - remains quite tachycardic despite the fact that she is adequately hydrated and pain free - left AMA this AM- UDS + for Cocaine and narcotics- had received Narcotics in hospital- admitted to the hospital 5 days ago and states she used cocaine around that time which would mean that the Cocaine in UDS was from this use- check UDS in 3-4  days if we really want to confirm whether she used Cocaine today  Hypokalemia - Repleted   Hypomagnesemia - repleted    Thrombocytopenia:  - No evidence of active bleed, likely due to sepsis   Anemia - drop in Hb from 11.4 on admission to 8-9 range could be due to acute illness - anemia panel suggest 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  Hepatitis C - ID following-per ID,  treat when drug abuse issues resolved  Nictone abuse - Nicotine patch    Antibiotics: Anti-infectives    None     Code Status:     Code Status Orders        Start     Ordered   11/29/15 0747  Full code   Continuous     11/29/15 0746    Code Status History    Date Active Date Inactive Code Status Order ID Comments User Context   This patient has a current code status but no historical code status.     Family Communication:Mother Ms [place]  [phone]) Disposition Plan: transfer to telemetry and to SNF when stable DVT prophylaxis: Lovenox Consultants: PCCm, ID, cardiology Procedures:  TTE 3/21 There is a mobile echodensity and thickening of the posterior  tricuspid valve leaflet measuring 17 x 11 mm suspicious for  tricuspid valve endocarditis and associated with moderate to  severe tricuspid regurgitation.  Further evaluation with TEE is recommended.   Objective: There were no vitals filed for this visit. No intake or output data in the 24 hours ending 12/04/15 1500   Vitals Filed Vitals:   12/04/15 1138 12/04/15  1200 12/04/15 1330  BP: 140/93 135/93 135/80  Pulse: 129 120 107  Temp: 99.2 F (37.3 C)    TempSrc: Oral    Resp: 24 25 23   SpO2: 98% 100% 100%    Exam:  General:  Pt is alert,  Distressed when coughing  HEENT: No icterus, No thrush, oral mucosa moist  Cardiovascular: regular rate and rhythm, S1/S2 No murmur- tachydardic  Respiratory: CTA b/l   Abdomen: Soft, +Bowel sounds, non tender, non distended, no guarding  MSK: No cyanosis or clubbing- no pedal  edema   Data Reviewed: Basic Metabolic Panel:  Recent Labs Lab 11/29/15 0842 11/30/15 0341 12/01/15 0321 12/02/15 0317 12/03/15 0315 12/04/15 0504 12/04/15 1209  NA  --  140 137 135 136 138 136  K  --  3.3* 4.7 3.5 3.9 3.6 3.8  CL  --  108 108 107 107 106 104  CO2  --  24 21* 21* 21* 23 23  GLUCOSE  --  129* 125* 104* 92 102* 102*  BUN  --  11 13 11 9 8 8   CREATININE  --  0.46 0.51 0.45 0.43* 0.48 0.51  CALCIUM  --  7.4* 7.9* 7.7* 7.4* 8.0* 8.2*  MG 1.5* 1.7  --  1.4* 1.9 1.6*  --   PHOS  --   --  4.0 3.1  --   --   --    Liver Function Tests:  Recent Labs Lab 11/29/15 0842 11/30/15 0341 12/04/15 1209  AST 50* 33 30  ALT 34 30 30  ALKPHOS 160* 144* 119  BILITOT 0.6 0.7 0.4  PROT 5.8* 5.4* 7.1  ALBUMIN 2.2* 2.0* 2.5*   No results for input(s): LIPASE, AMYLASE in the last 168 hours. No results for input(s): AMMONIA in the last 168 hours. CBC:  Recent Labs Lab 11/29/15 0357  12/01/15 0321 12/02/15 0317 12/03/15 0315 12/04/15 0504 12/04/15 1209  WBC 7.4  < > 10.4 9.5 9.4 10.9* 14.6*  NEUTROABS 6.0  --   --   --   --   --  12.4*  HGB 11.4*  < > 9.1* 8.3* 9.2* 9.1* 10.0*  HCT 34.9*  < > 26.7* 24.0* 27.2* 27.4* 30.6*  MCV 87.7  < > 86.7 82.5 86.1 87.5 85.7  PLT 87*  < > 176 145* 267 350 413*  < > = values in this interval not displayed. Cardiac Enzymes: No results for input(s): CKTOTAL, CKMB, CKMBINDEX, TROPONINI in the last 168 hours. BNP (last 3 results) No results for input(s): BNP in the last 8760 hours.  ProBNP (last 3 results) No results for input(s): PROBNP in the last 8760 hours.  CBG: No results for input(s): GLUCAP in the last 168 hours.  Recent Results (from the past 240 hour(s))  Blood culture (routine x 2)     Status: None   Collection Time: 11/29/15  5:40 AM  Result Value Ref Range Status   Specimen Description BLOOD LEFT FOREARM  Final   Special Requests BOTTLES DRAWN AEROBIC AND ANAEROBIC 5CC  Final   Culture  Setup Time   Final     GRAM POSITIVE COCCI IN CLUSTERS IN BOTH AEROBIC AND ANAEROBIC BOTTLES CRITICAL RESULT CALLED TO, READ BACK BY AND VERIFIED WITH: Concepcion LivingH JOHNSON RN 2022 11/29/15 A BROWNING    Culture   Final    STAPHYLOCOCCUS AUREUS SUSCEPTIBILITIES PERFORMED ON PREVIOUS CULTURE WITHIN THE LAST 5 DAYS. Per[facility]    Report Status 12/01/2015 FINAL  Final  Blood  culture (routine x 2)     Status: None   Collection Time: 11/29/15  5:45 AM  Result Value Ref Range Status   Specimen Description BLOOD RIGHT FOREARM  Final   Special Requests BOTTLES DRAWN AEROBIC AND ANAEROBIC 6CC  Final   Culture  Setup Time   Final    GRAM POSITIVE COCCI IN CLUSTERS IN BOTH AEROBIC AND ANAEROBIC BOTTLES CRITICAL RESULT CALLED TO, READ BACK BY AND VERIFIED WITH: Concepcion Living RN 2022 11/29/15 A BROWNING    Culture   Final    STAPHYLOCOCCUS AUREUS Performed at [facility]    Report Status 12/01/2015 FINAL  Final   Organism ID, Bacteria STAPHYLOCOCCUS AUREUS  Final      Susceptibility   Staphylococcus aureus - MIC*    CIPROFLOXACIN <=0.5 SENSITIVE Sensitive     ERYTHROMYCIN 0.5 SENSITIVE Sensitive     GENTAMICIN <=0.5 SENSITIVE Sensitive     OXACILLIN 0.5 SENSITIVE Sensitive     TETRACYCLINE >=16 RESISTANT Resistant     VANCOMYCIN 1 SENSITIVE Sensitive     TRIMETH/SULFA <=10 SENSITIVE Sensitive     CLINDAMYCIN <=0.25 SENSITIVE Sensitive     RIFAMPIN <=0.5 SENSITIVE Sensitive     Inducible Clindamycin NEGATIVE Sensitive     * STAPHYLOCOCCUS AUREUS  Culture, Urine     Status: None   Collection Time: 11/29/15  6:18 AM  Result Value Ref Range Status   Specimen Description URINE, CLEAN CATCH  Final   Special Requests NONE  Final   Culture   Final    >=100,000 COLONIES/mL ESCHERICHIA COLI Performed at [facility]    Report Status 12/01/2015 FINAL  Final   Organism ID, Bacteria ESCHERICHIA COLI  Final      Susceptibility   Escherichia coli - MIC*    AMPICILLIN >=32 RESISTANT Resistant      CEFAZOLIN <=4 SENSITIVE Sensitive     CEFTRIAXONE <=1 SENSITIVE Sensitive     CIPROFLOXACIN <=0.25 SENSITIVE Sensitive     GENTAMICIN <=1 SENSITIVE Sensitive     IMIPENEM <=0.25 SENSITIVE Sensitive     NITROFURANTOIN <=16 SENSITIVE Sensitive     TRIMETH/SULFA >=320 RESISTANT Resistant     AMPICILLIN/SULBACTAM >=32 RESISTANT Resistant     PIP/TAZO <=4 SENSITIVE Sensitive     * >=100,000 COLONIES/mL ESCHERICHIA COLI  Culture, sputum-assessment     Status: None   Collection Time: 11/29/15  8:22 AM  Result Value Ref Range Status   Specimen Description SPUTUM  Final   Special Requests NONE  Final   Sputum evaluation   Final    THIS SPECIMEN IS ACCEPTABLE. RESPIRATORY CULTURE REPORT TO FOLLOW.   Report Status 11/29/2015 FINAL  Final  Culture, respiratory (NON-Expectorated)     Status: None   Collection Time: 11/29/15  8:22 AM  Result Value Ref Range Status   Specimen Description SPUTUM  Final   Special Requests NONE  Final   Gram Stain   Final    ABUNDANT WBC PRESENT, PREDOMINANTLY PMN ABUNDANT SQUAMOUS EPITHELIAL CELLS PRESENT ABUNDANT GRAM POSITIVE COCCI IN PAIRS IN CLUSTERS MODERATE GRAM NEGATIVE RODS Performed at [facility]    Culture   Final    RARE GROUP A STREP (S.PYOGENES) ISOLATED Note: Beta hemolytic streptococci are predictably susceptible to penicillin and other beta lactams. Susceptibility testing not routinely performed. Performed at [facility]    Report Status 12/01/2015 FINAL  Final  MRSA PCR Screening     Status: None   Collection Time: 11/29/15 11:41 AM  Result  Value Ref Range Status   MRSA by PCR NEGATIVE NEGATIVE Final    Comment:        The GeneXpert MRSA Assay (FDA approved for NASAL specimens only), is one component of a comprehensive MRSA colonization surveillance program. It is not intended to diagnose MRSA infection nor to guide or monitor treatment for MRSA infections.   Culture, blood (routine x 2)     Status: None  (Preliminary result)   Collection Time: 11/30/15 12:35 PM  Result Value Ref Range Status   Specimen Description BLOOD RIGHT ARM  Final   Special Requests BOTTLES DRAWN AEROBIC AND ANAEROBIC 10CC  Final   Culture   Final    NO GROWTH 4 DAYS Performed at [facility]    Report Status PENDING  Incomplete  Culture, blood (routine x 2)     Status: None   Collection Time: 11/30/15 12:40 PM  Result Value Ref Range Status   Specimen Description BLOOD RIGHT ARM  Final   Special Requests IN PEDIATRIC BOTTLE 1CC  Final   Culture  Setup Time   Final    GRAM POSITIVE COCCI IN CLUSTERS AEROBIC BOTTLE ONLY CRITICAL RESULT CALLED TO, READ BACK BY AND VERIFIED WITH: Drucilla Schmidt  12/01/15 MKELLY    Culture   Final    STAPHYLOCOCCUS AUREUS SUSCEPTIBILITIES PERFORMED ON PREVIOUS CULTURE WITHIN THE LAST 5 DAYS. Performed at [facility]    Report Status 12/02/2015 FINAL  Final  Culture, blood (Routine X 2) w Reflex to ID Panel     Status: None (Preliminary result)   Collection Time: 12/02/15  9:45 AM  Result Value Ref Range Status   Specimen Description BLOOD LEFT ARM  Final   Special Requests BOTTLES DRAWN AEROBIC ONLY 7CC  Final   Culture   Final    NO GROWTH 2 DAYS Performed at [facility]    Report Status PENDING  Incomplete     Studies: No results found.  Scheduled Meds:  Scheduled Meds:  Continuous Infusions:   Time spent on care of this patient: 35 min   Catlin Aycock, MD 12/04/2015, 3:00 PM  LOS: 0 days   [facility] Office  [phone] Pager - Text Page per [email or web address] If 7PM-7AM, please contact night-coverage [email or web address]

## 2015-12-04 NOTE — ED Notes (Signed)
Notified MD of patient condition and return to hospital.  Holding lab orders until seen by MD.

## 2015-12-04 NOTE — ED Provider Notes (Signed)
CSN: [id number]     Arrival date & time 12/04/15  1123 History   First MD Initiated Contact with Patient 12/04/15 1153     Chief Complaint  Patient presents with  . Abdominal Pain  . Tachycardia     (Consider location/radiation/quality/duration/timing/severity/associated sxs/prior Treatment) Patient is a 25 y.o. female presenting with general illness. The history is provided by the patient.  Illness Severity:  Moderate Onset quality:  Gradual Duration:  1 week Timing:  Constant Progression:  Worsening Chronicity:  New Associated symptoms: chest pain and fever   Associated symptoms: no congestion, no headaches, no myalgias, no nausea, no rhinorrhea, no shortness of breath, no vomiting and no wheezing    25 yo F With a chief complaint of left-sided chest pain. This been going on for quite some time. Patient also having fevers chills. Was recently admitted to the hospital and found to have septic emboli to the left side of her chest associated with MSSA bacteremia.  Patient was in the hospital today and left AGAINST MEDICAL ADVICE. When asked where she went patient denied any illegal drug use. She returned for continuing pain.  Past Medical History  Diagnosis Date  . Hepatitis C   . Gall bladder disease   . Heroin abuse    Past Surgical History  Procedure Laterality Date  . Cesarean section     Family History  Problem Relation Age of Onset  . Multiple sclerosis Mother   . Hypertension Father   . Diabetes Mellitus I Father    Social History  Substance Use Topics  . Smoking status: Current Every Day Smoker -- 1.00 packs/day    Types: Cigarettes  . Smokeless tobacco: None  . Alcohol Use: Yes     Comment: wine occasional   OB History    No data available     Review of Systems  Constitutional: Positive for fever. Negative for chills.  HENT: Negative for congestion and rhinorrhea.   Eyes: Negative for redness and visual disturbance.  Respiratory: Negative for shortness  of breath and wheezing.   Cardiovascular: Positive for chest pain. Negative for palpitations.  Gastrointestinal: Negative for nausea and vomiting.  Genitourinary: Negative for dysuria and urgency.  Musculoskeletal: Negative for myalgias and arthralgias.  Skin: Negative for pallor and wound.  Neurological: Negative for dizziness and headaches.      Allergies  Review of patient's allergies indicates no known allergies.  Home Medications   Prior to Admission medications   Not on File   BP 140/93 mmHg  Pulse 129  Temp(Src) 99.2 F (37.3 C) (Oral)  Resp 24  SpO2 98%  LMP  (LMP Unknown) Physical Exam  Constitutional: She is oriented to person, place, and time. She appears well-developed and well-nourished. No distress.  Chronically ill appearing  HENT:  Head: Normocephalic and atraumatic.  Eyes: EOM are normal. Pupils are equal, round, and reactive to light.  Neck: Normal range of motion. Neck supple.  Cardiovascular: Normal rate and regular rhythm.  Exam reveals no gallop and no friction rub.   No murmur heard. Pulmonary/Chest: Effort normal. She has no wheezes. She has no rales. She exhibits tenderness (left chest wall).  Abdominal: Soft. She exhibits no distension. There is no tenderness.  Musculoskeletal: She exhibits no edema or tenderness.  Neurological: She is alert and oriented to person, place, and time.  Skin: Skin is warm and dry. She is not diaphoretic.  Psychiatric: She has a normal mood and affect. Her behavior is normal.  Nursing note  and vitals reviewed.   ED Course  Procedures (including critical care time) Labs Review Labs Reviewed - No data to display  Imaging Review No results found. I have personally reviewed and evaluated these images and lab results as part of my medical decision-making.   EKG Interpretation None      MDM   Final diagnoses:  Bacteremia due to Staphylococcus aureus- MSSA    25 yo F with a chief complaints of left-sided  chest pain. Patient has known MSSA bacteremia. They left AMA. The patient denied any illegal drug use however was positive for cocaine on her UDS. Will readmit.  The patients results and plan were reviewed and discussed.   Any x-rays performed were independently reviewed by myself.   Differential diagnosis were considered with the presenting HPI.  Medications  lactated ringers bolus 2,000 mL (2,000 mLs Intravenous New Bag/Given 12/04/15 1231)  acetaminophen (TYLENOL) tablet 1,000 mg (1,000 mg Oral Given 12/04/15 1255)  ketorolac (TORADOL) 30 MG/ML injection 30 mg (30 mg Intravenous Given 12/04/15 1255)  ketamine 100 mg in normal saline 10 mL ( /mL) syringe (15 mg Intravenous Given 12/04/15 1255)    Filed Vitals:   12/04/15 1138 12/04/15 1200 12/04/15 1330  BP: 140/93 135/93 135/80  Pulse: 129 120 107  Temp: 99.2 F (37.3 C)    TempSrc: Oral    Resp: SpO2: 98% 100% 100%    Final diagnoses:  Bacteremia due to Staphylococcus aureus- MSSA    Admission/ observation were discussed with the admitting physician, patient and/or family and they are comfortable with the plan.       Melene Plan, DO 12/04/15 1501

## 2015-12-04 NOTE — ED Notes (Signed)
[facility] to [id number]

## 2015-12-04 NOTE — ED Notes (Signed)
Attempted to call report X 2. 

## 2015-12-05 DIAGNOSIS — B9561 Methicillin susceptible Staphylococcus aureus infection as the cause of diseases classified elsewhere: Secondary | ICD-10-CM

## 2015-12-05 LAB — BASIC METABOLIC PANEL
Anion gap: 9 (ref 5–15)
BUN: 8 mg/dL (ref 6–20)
CALCIUM: 8.3 mg/dL — AB (ref 8.9–10.3)
CHLORIDE: 105 mmol/L (ref 101–111)
CO2: 25 mmol/L (ref 22–32)
CREATININE: 0.38 mg/dL — AB (ref 0.44–1.00)
Glucose, Bld: 106 mg/dL — ABNORMAL HIGH (ref 65–99)
Potassium: 3.6 mmol/L (ref 3.5–5.1)
SODIUM: 139 mmol/L (ref 135–145)

## 2015-12-05 LAB — CBC
HCT: 26.5 % — ABNORMAL LOW (ref 36.0–46.0)
Hemoglobin: 9 g/dL — ABNORMAL LOW (ref 12.0–15.0)
MCH: 29.5 pg (ref 26.0–34.0)
MCHC: 34 g/dL (ref 30.0–36.0)
MCV: 86.9 fL (ref 78.0–100.0)
PLATELETS: 381 10*3/uL (ref 150–400)
RBC: 3.05 MIL/uL — AB (ref 3.87–5.11)
RDW: 14.5 % (ref 11.5–15.5)
WBC: 10.8 10*3/uL — AB (ref 4.0–10.5)

## 2015-12-05 LAB — CULTURE, BLOOD (ROUTINE X 2): CULTURE: NO GROWTH

## 2015-12-07 LAB — CULTURE, BLOOD (ROUTINE X 2): Culture: NO GROWTH

## 2015-12-07 NOTE — Discharge Summary (Signed)
Physician Discharge Summary  [name] ZOX:[id number] DOB: 05/17/1991 DOA: 12/04/2015  PCP: No PCP Per Patient  Admit date: 12/04/2015 Discharge date: 12/07/2015  Time spent: 15 minutes  Patient left AMA prior to me seeing her. I was not called until after she left. BELOW IS MY LAST NOTE   History of present illness:  Madeline MimesLori Kaye Pischke is a 25 y.o. female past medical history significant forIV drug abuse who presents with chest pain and cough, CTA chest significant for multiple pulmonary septic emboli- bacteremic with Staphylococcus aureus. TTE reveals vegetation on tricuspid valve with mod to severe regurg. She left AMA earlier today and was gone for 3 hrs. She was found be security outside the hospital and brought back. I have readmitted her to the hospital.    Hospital Course:  Sepsis - MSSA bacteremiawith septic pulm emboli and tricuspid endocarditis - in the setting of IV drug abuse. 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- patient and mother in agreement- Social services called - agree with Toradol for chest pain- no narcotics  UTI - E coli- senstive to all antibiotics she has been on so far  Polysubstance abuse with IV drug abuse - went through withdrawal in ICU- PCCM assisting with management - remains quite tachycardic despite the fact that she is adequately hydrated and pain free - left AMA this AM- UDS + for Cocaine and narcotics- had received Narcotics in hospital- admitted to the hospital 5 days ago and states she used cocaine around that time which would  mean that the Cocaine in UDS was from this use- check UDS in 3-4 days if we really want to confirm whether she used Cocaine today  Hypokalemia - Repleted   Hypomagnesemia - repleted   Thrombocytopenia:  - No evidence of active bleed, likely due to sepsis   Anemia - drop in Hb from 11.4 on admission to 8-9 range could be due to acute illness - anemia panel suggest 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  Hepatitis C - ID following-per ID, treat when drug abuse issues resolved  Nictone abuse - Nicotine patch  Consultants: PCCm, ID, cardiology Procedures:  TTE 3/21 There is a mobile echodensity and thickening of the posterior  tricuspid valve leaflet measuring 17 x 11 mm suspicious for  tricuspid valve endocarditis and associated with moderate to  severe tricuspid regurgitation.  Further evaluation with TEE is recommended.    Discharge Exam: Filed Weights   12/04/15 1510  Weight: 60.328 kg (133 lb)   Filed Vitals:   12/04/15 2143 12/05/15 0558  BP: 136/93 131/80  Pulse: 88 100  Temp: 98.2 F (36.8 C) 98.8 F (37.1 C)  Resp: 16 18  Exam:  General: Pt is alert, Distressed when coughing  HEENT: No icterus, No thrush, oral mucosa moist  Cardiovascular: regular rate and rhythm, S1/S2 No murmur- tachydardic  Respiratory: CTA b/l   Abdomen: Soft, +Bowel sounds, non tender, non distended, no guarding MSK: No cyanosis or clubbing- no pedal edema  Discharge Instructions You were cared for by a hospitalist during your hospital stay. If you have any questions about your discharge medications or the care you  received while you were in the hospital after you are discharged, you can call the unit and asked to speak with the hospitalist on call if the hospitalist that took care of you is not available. Once you are discharged, your primary care physician will handle any further medical issues. Please note that NO REFILLS for any discharge medications will be authorized once you  are discharged, as it is imperative that you return to your primary care physician (or establish a relationship with a primary care physician if you do not have one) for your aftercare needs so that they can reassess your need for medications and monitor your lab values.     Medication List    Notice    You have not been prescribed any medications.     No Known Allergies    The results of significant diagnostics from this hospitalization (including imaging, microbiology, ancillary and laboratory) are listed below for reference.    Significant Diagnostic Studies: Ct Angio Chest Pe W/cm &/or Wo Cm  11/29/2015  CLINICAL DATA:  Dyspnea.  Hematemesis. EXAM: CT ANGIOGRAPHY CHEST WITH CONTRAST TECHNIQUE: Multidetector CT imaging of the chest was performed using the standard protocol during bolus administration of intravenous contrast. Multiplanar CT image reconstructions and MIPs were obtained to evaluate the vascular anatomy. CONTRAST:  OMNIPAQUE IOHEXOL 350 MG/ML SOLN COMPARISON:  CT 01/04/2015 FINDINGS: There is good opacification of the pulmonary vasculature. There is no pulmonary embolism. The thoracic aorta is normal in caliber and intact. Review of the MIP images confirms the above findings. There are numerous peripheral nodules and patchy consolidation throughout both lungs, with some predominance in the lower lobes. Some confluence of peripheral opacities in the left lower lobe posteriorly. No cavitation. Symmetric hilar adenopathy.  Nonspecific mediastinal nodes. No pleural effusions. No significant skeletal lesion. IMPRESSION: 1. Negative for acute pulmonary embolism. 2. Numerous peripheral nodules and patchy consolidation throughout both lungs. This likely is infectious. Septic emboli is a consideration. Atypical infection is a consideration. Neoplasm is unlikely. Electronically Signed   By: Ellery Plunk M.D.   On: 11/29/2015 05:09   Dg Chest Port 1 View  12/02/2015  CLINICAL  DATA:  Respiratory failure EXAM: PORTABLE CHEST 1 VIEW COMPARISON:  11/29/2015 FINDINGS: Cardiac shadow is within normal limits. Patchy nodular changes are again seen throughout both lungs similar to that noted on prior CT examination. No focal confluent infiltrate is seen. No bony abnormality is noted. IMPRESSION: Patchy nodular changes in both lungs similar to that seen on the prior exam. Electronically Signed   By: Alcide Clever M.D.   On: 12/02/2015 07:13    Microbiology: Recent Results (from the past 240 hour(s))  Blood culture (routine x 2)     Status: None   Collection Time: 11/29/15  5:40 AM  Result Value Ref Range Status   Specimen Description BLOOD LEFT FOREARM  Final   Special Requests BOTTLES DRAWN AEROBIC AND ANAEROBIC 5CC  Final   Culture  Setup Time   Final    GRAM POSITIVE COCCI IN CLUSTERS IN BOTH AEROBIC AND ANAEROBIC BOTTLES CRITICAL RESULT CALLED TO, READ BACK BY AND VERIFIED WITH: Concepcion Living RN 2022 11/29/15 A BROWNING    Culture   Final    STAPHYLOCOCCUS AUREUS SUSCEPTIBILITIES PERFORMED ON PREVIOUS CULTURE WITHIN THE LAST 5 DAYS. Performed at [facility]    Report Status 12/01/2015 FINAL  Final  Blood culture (routine x 2)     Status: None   Collection Time: 11/29/15  5:45  AM  Result Value Ref Range Status   Specimen Description BLOOD RIGHT FOREARM  Final   Special Requests BOTTLES DRAWN AEROBIC AND ANAEROBIC 6CC  Final   Culture  Setup Time   Final    GRAM POSITIVE COCCI IN CLUSTERS IN BOTH AEROBIC AND ANAEROBIC BOTTLES CRITICAL RESULT CALLED TO, READ BACK BY AND VERIFIED WITH: Concepcion Living RN 2022 11/29/15 A BROWNING    Culture   Final    STAPHYLOCOCCUS AUREUS Performed at [facility]    Report Status 12/01/2015 FINAL  Final   Organism ID, Bacteria STAPHYLOCOCCUS AUREUS  Final      Susceptibility   Staphylococcus aureus - MIC*    CIPROFLOXACIN <=0.5 SENSITIVE Sensitive     ERYTHROMYCIN 0.5 SENSITIVE Sensitive     GENTAMICIN <=0.5 SENSITIVE  Sensitive     OXACILLIN 0.5 SENSITIVE Sensitive     TETRACYCLINE >=16 RESISTANT Resistant     VANCOMYCIN 1 SENSITIVE Sensitive     TRIMETH/SULFA <=10 SENSITIVE Sensitive     CLINDAMYCIN <=0.25 SENSITIVE Sensitive     RIFAMPIN <=0.5 SENSITIVE Sensitive     Inducible Clindamycin NEGATIVE Sensitive     * STAPHYLOCOCCUS AUREUS  Culture, Urine     Status: None   Collection Time: 11/29/15  6:18 AM  Result Value Ref Range Status   Specimen Description URINE, CLEAN CATCH  Final   Special Requests NONE  Final   Culture   Final    >=100,000 COLONIES/mL ESCHERICHIA COLI Performed at [facility]    Report Status 12/01/2015 FINAL  Final   Organism ID, Bacteria ESCHERICHIA COLI  Final      Susceptibility   Escherichia coli - MIC*    AMPICILLIN >=32 RESISTANT Resistant     CEFAZOLIN <=4 SENSITIVE Sensitive     CEFTRIAXONE <=1 SENSITIVE Sensitive     CIPROFLOXACIN <=0.25 SENSITIVE Sensitive     GENTAMICIN <=1 SENSITIVE Sensitive     IMIPENEM <=0.25 SENSITIVE Sensitive     NITROFURANTOIN <=16 SENSITIVE Sensitive     TRIMETH/SULFA >=320 RESISTANT Resistant     AMPICILLIN/SULBACTAM >=32 RESISTANT Resistant     PIP/TAZO <=4 SENSITIVE Sensitive     * >=100,000 COLONIES/mL ESCHERICHIA COLI  Culture, sputum-assessment     Status: None   Collection Time: 11/29/15  8:22 AM  Result Value Ref Range Status   Specimen Description SPUTUM  Final   Special Requests NONE  Final   Sputum evaluation   Final    THIS SPECIMEN IS ACCEPTABLE. RESPIRATORY CULTURE REPORT TO FOLLOW.   Report Status 11/29/2015 FINAL  Final  Culture, respiratory (NON-Expectorated)     Status: None   Collection Time: 11/29/15  8:22 AM  Result Value Ref Range Status   Specimen Description SPUTUM  Final   Special Requests NONE  Final   Gram Stain   Final    ABUNDANT WBC PRESENT, PREDOMINANTLY PMN ABUNDANT SQUAMOUS EPITHELIAL CELLS PRESENT ABUNDANT GRAM POSITIVE COCCI IN PAIRS IN CLUSTERS MODERATE GRAM NEGATIVE  RODS Performed at [facility]    Culture   Final    RARE GROUP A STREP (S.PYOGENES) ISOLATED Note: Beta hemolytic streptococci are predictably susceptible to penicillin and other beta lactams. Susceptibility testing not routinely performed. Performed at [facility]    Report Status 12/01/2015 FINAL  Final  MRSA PCR Screening     Status: None   Collection Time: 11/29/15 11:41 AM  Result Value Ref Range Status   MRSA by PCR NEGATIVE NEGATIVE Final    Comment:  The GeneXpert MRSA Assay (FDA approved for NASAL specimens only), is one component of a comprehensive MRSA colonization surveillance program. It is not intended to diagnose MRSA infection nor to guide or monitor treatment for MRSA infections.   Culture, blood (routine x 2)     Status: None   Collection Time: 11/30/15 12:35 PM  Result Value Ref Range Status   Specimen Description BLOOD RIGHT ARM  Final   Special Requests BOTTLES DRAWN AEROBIC AND ANAEROBIC 10CC  Final   Culture   Final    NO GROWTH 5 DAYS Performed at [facility]    Report Status 12/05/2015 FINAL  Final  Culture, blood (routine x 2)     Status: None   Collection Time: 11/30/15 12:40 PM  Result Value Ref Range Status   Specimen Description BLOOD RIGHT ARM  Final   Special Requests IN PEDIATRIC BOTTLE 1CC  Final   Culture  Setup Time   Final    GRAM POSITIVE COCCI IN CLUSTERS AEROBIC BOTTLE ONLY CRITICAL RESULT CALLED TO, READ BACK BY AND VERIFIED WITH: Drucilla Schmidt  12/01/15 MKELLY    Culture   Final    STAPHYLOCOCCUS AUREUS SUSCEPTIBILITIES PERFORMED ON PREVIOUS CULTURE WITHIN THE LAST 5 DAYS. Performed at [facility]    Report Status 12/02/2015 FINAL  Final  Culture, blood (Routine X 2) w Reflex to ID Panel     Status: None   Collection Time: 12/02/15  9:45 AM  Result Value Ref Range Status   Specimen Description BLOOD LEFT ARM  Final   Special Requests BOTTLES DRAWN AEROBIC ONLY 7CC  Final    Culture   Final    NO GROWTH 5 DAYS Performed at [facility]    Report Status 12/07/2015 FINAL  Final  Blood culture (routine x 2)     Status: None (Preliminary result)   Collection Time: 12/04/15 12:09 PM  Result Value Ref Range Status   Specimen Description BLOOD LEFT ANTECUBITAL  Final   Special Requests BOTTLES DRAWN AEROBIC AND ANAEROBIC 5 CC EACH  Final   Culture   Final    NO GROWTH 3 DAYS Performed at [facility]    Report Status PENDING  Incomplete  Blood culture (routine x 2)     Status: None (Preliminary result)   Collection Time: 12/04/15 12:35 PM  Result Value Ref Range Status   Specimen Description BLOOD LEFT ANTECUBITAL  Final   Special Requests BOTTLES DRAWN AEROBIC AND ANAEROBIC 5 CC EACH  Final   Culture   Final    NO GROWTH 3 DAYS Performed at [facility]    Report Status PENDING  Incomplete     Labs: Basic Metabolic Panel:  Recent Labs Lab 12/01/15 0321 12/02/15 0317 12/03/15 0315 12/04/15 0504 12/04/15 1209 12/05/15 0331  NA 137 135 136 138 136 139  K 4.7 3.5 3.9 3.6 3.8 3.6  CL 108 107 107 106 104 105  CO2 21* 21* 21* GLUCOSE 125* 104* 92 102* 102* 106*  BUN CREATININE 0.51 0.45 0.43* 0.48 0.51 0.38*  CALCIUM 7.9* 7.7* 7.4* 8.0* 8.2* 8.3*  MG  --  1.4* 1.9 1.6*  --   --   PHOS 4.0 3.1  --   --   --   --    Liver Function Tests:  Recent Labs Lab 12/04/15 1209  AST 30  ALT 30  ALKPHOS 119  BILITOT 0.4  PROT  7.1  ALBUMIN 2.5*   No results for input(s): LIPASE, AMYLASE in the last 168 hours. No results for input(s): AMMONIA in the last 168 hours. CBC:  Recent Labs Lab 12/02/15 0317 12/03/15 0315 12/04/15 0504 12/04/15 1209 12/05/15 0331  WBC 9.5 9.4 10.9* 14.6* 10.8*  NEUTROABS  --   --   --  12.4*  --   HGB 8.3* 9.2* 9.1* 10.0* 9.0*  HCT 24.0* 27.2* 27.4* 30.6* 26.5*  MCV 82.5 86.1 87.5 85.7 86.9  PLT 145* 267 350 413* 381   Cardiac Enzymes: No results for input(s):  CKTOTAL, CKMB, CKMBINDEX, TROPONINI in the last 168 hours. BNP: BNP (last 3 results) No results for input(s): BNP in the last 8760 hours.  ProBNP (last 3 results) No results for input(s): PROBNP in the last 8760 hours.  CBG: No results for input(s): GLUCAP in the last 168 hours.     SignedCalvert Cantor, MD [facility] 12/07/2015, 4:12 PM

## 2015-12-09 LAB — CULTURE, BLOOD (ROUTINE X 2)
CULTURE: NO GROWTH
Culture: NO GROWTH

## 2015-12-11 NOTE — Discharge Summary (Signed)
The patient left the hospital today without notice. Her Telemetry box and gown were found on the bed. Below is my last note from my eval on the the morning og 12/04/15.   Brief narrative: Madeline Andrade is a 25 y.o. female past medical history significant forIV drug abuse who presents with chest pain and cough, CTA chest significant for multiple pulmonary septic emboli- bacteremic with Staphylococcus aureus. TTE reveals vegetation on tricuspid valve with mod to severe regurg.   Subjective: Cough and chest pain have improved today. No nausea, vomiting, diarrhea, constipation. Eating well. Voiding a great deal. Discussed the cause of her illness and plan in detail with mother present. Becomes tearful about her drug use and remorseful. States she has no reason to abuse anymore.  Assessment/Plan: Sepsis - MSSA bacteremiawith septic pulm emboli and tricuspid endocarditis - in the setting of IV drug abuse. 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-= patient and mother in agreement- Social services called - agree with Toradol for chest pain  UTI - E coli- senstive to all antibiotics she has been on so far  Polysubstance abuse with IV drug abuse - in withdrawal- PCCM assisting with management - still quite tachycardic despite the fact that she is adequately hydrated and pain free  Hypokalemia - Repleted   Hypomagnesemia - repleted   Thrombocytopenia:  - No evidence of active bleed, likely due to sepsis   Anemia - drop in Hb from 11.4 on admission to 8-9 range could be due  to acute illness - anemia panel suggest 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  Hepatitis C - ID checking genotype and viral load- treat when drug abuse issues resolved  Nictone abuse - Nicotine patch    Antibiotics: Anti-infectives    Start   Dose/Rate Route Frequency Ordered Stop   12/01/15 1600  ceFAZolin (ANCEF) IVPB 2g/100 mL premix    2 g over 30 Minutes Intravenous 3 times per day 12/01/15 1450    12/01/15 0600  vancomycin (VANCOCIN) IVPB 1000 mg/200 mL premix Status: Discontinued    1,000 mg 200 mL/hr over 60 Minutes Intravenous Every 8 hours 11/30/15 2309 12/01/15 1450   11/30/15 2315  vancomycin (VANCOCIN) 500 mg in sodium chloride 0.9 % 100 mL IVPB    500 mg 100 mL/hr over 60 Minutes Intravenous Once 11/30/15 2309 12/01/15 0020   11/30/15 0900  azithromycin (ZITHROMAX) 500 mg in dextrose 5 % 250 mL IVPB Status: Discontinued    500 mg 250 mL/hr over 60 Minutes Intravenous Every 24 hours 11/29/15 1351 11/30/15 0012   11/30/15 0800  azithromycin (ZITHROMAX) 500 mg in dextrose 5 % 250 mL IVPB Status: Discontinued    500 mg 250 mL/hr over 60 Minutes Intravenous Every 24 hours 11/29/15 0624 11/29/15 1351   11/30/15 0030  piperacillin-tazobactam (ZOSYN) IVPB 3.375 g Status: Discontinued    3.375 g 12.5 mL/hr over 240 Minutes Intravenous 3 times per day 11/30/15 0022 12/01/15 1450   11/29/15 1400  vancomycin (VANCOCIN) 500 mg in sodium chloride 0.9 % 100 mL IVPB Status: Discontinued    500 mg 100 mL/hr over 60 Minutes Intravenous Every 8 hours 11/29/15 0754  11/30/15 2308   11/29/15 0630  cefTRIAXone (ROCEPHIN) 1 g in dextrose 5 % 50 mL IVPB Status: Discontinued    1 g 100 mL/hr over 30 Minutes Intravenous Once 11/29/15 0618 11/29/15 0624   11/29/15 0630  azithromycin (ZITHROMAX) 500 mg in dextrose 5 % 250 mL IVPB    500 mg 250 mL/hr over 60 Minutes Intravenous  Once 11/29/15 0618 11/29/15 0805   11/29/15 0630  cefTRIAXone (ROCEPHIN) 2 g in dextrose 5 % 50 mL IVPB Status: Discontinued    2 g 100 mL/hr over 30 Minutes Intravenous Daily 11/29/15 0624 11/30/15 0012   11/29/15 0545  vancomycin (VANCOCIN) IVPB 1000 mg/200 mL premix    1,000 mg 200 mL/hr over 60 Minutes Intravenous Once 11/29/15 0538 11/29/15 0700   11/29/15 0545  piperacillin-tazobactam (ZOSYN) IVPB 3.375 g Status: Discontinued    3.375 g 12.5 mL/hr over 240 Minutes Intravenous Once 11/29/15 0538 11/29/15 0552     Code Status:     Code Status Orders        Start   Ordered   11/29/15 0747  Full code Continuous    11/29/15 0746    Code Status History    Date Active Date Inactive Code Status Order ID Comments User Context   This patient has a current code status but no historical code status.     Family Communication:Mother Ms [place] [phone]) Disposition Plan: transfer to telemetry and to SNF when stable DVT prophylaxis: Lovenox Consultants: PCCm, ID, cardiology Procedures:  TTE 3/21 There is a mobile echodensity and thickening of the posterior  tricuspid valve leaflet measuring 17 x 11 mm suspicious for  tricuspid valve endocarditis and associated with moderate to  severe tricuspid regurgitation.  Further evaluation with TEE is recommended.   Objective: Filed Weights   11/29/15 0745 12/02/15 0400  Weight: 54.432 kg (120 lb) 59.9 kg (132 lb 0.9 oz)    Intake/Output Summary (Last 24 hours) at 12/03/15 0841 Last data filed at 12/03/15 0606  Gross per 24 hour  Intake  950 ml  Output  4650 ml  Net -3700 ml     Vitals Filed Vitals:   12/03/15 0400 12/03/15 0500 12/03/15 0600 12/03/15 0803  BP: 128/71   119/89  Pulse: 92 97 88 97  Temp: 99.3 F (37.4 C) 99.3 F (37.4 C) 99.3 F (37.4 C)   TempSrc:      Resp: 27 25 29  26   Height:      Weight:      SpO2: 99% 100% 99% 100%    Exam:  General: Pt is alert, Distressed when coughing  HEENT: No icterus, No thrush, oral mucosa moist  Cardiovascular: regular rate and rhythm, S1/S2 No murmur- tachydardic  Respiratory: CTA b/l   Abdomen: Soft, +Bowel sounds, non tender, non distended, no guarding  MSK: No cyanosis or clubbing- no pedal edema   Data Reviewed: Basic Metabolic Panel:  Last Labs      Recent Labs Lab 11/29/15 0357 11/29/15 0842 11/30/15 0341 12/01/15 0321 12/02/15 0317 12/03/15 0315  NA 137 --  140 137 135 136  K 2.9* --  3.3* 4.7 3.5 3.9  CL 96* --  108 108 107 107  CO2 30 --  24 21* 21* 21*  GLUCOSE 147* --  129* 125* 104* 92  BUN 10 --  11 13 11 9   CREATININE 0.48 --  0.46 0.51 0.45 0.43*  CALCIUM 8.1* --  7.4* 7.9* 7.7* 7.4*  MG --  1.5* 1.7 --  1.4* 1.9  PHOS --  --  --  4.0 3.1 --      Liver Function Tests:  Last Labs      Recent Labs Lab 11/29/15 0842 11/30/15 0341  AST 50* 33  ALT 34 30  ALKPHOS 160* 144*  BILITOT 0.6 0.7  PROT 5.8* 5.4*  ALBUMIN 2.2* 2.0*      Last Labs     No results for input(s): LIPASE, AMYLASE in the last 168 hours.    Last Labs     No results for input(s): AMMONIA in the last 168 hours.   CBC:  Last Labs      Recent Labs Lab 11/29/15 0357 11/30/15 0341 12/01/15 0321 12/02/15 0317 12/03/15 0315  WBC 7.4 9.5 10.4 9.5 9.4  NEUTROABS 6.0 --  --  --  --   HGB 11.4* 8.8* 9.1* 8.3* 9.2*  HCT 34.9* 25.8* 26.7* 24.0* 27.2*  MCV 87.7 83.0 86.7 82.5 86.1  PLT 87* 117* 176 145* 267     Cardiac Enzymes:  Last Labs     No results for input(s): CKTOTAL, CKMB, CKMBINDEX, TROPONINI in the last 168 hours.   BNP (last 3 results)  Recent Labs (within last 365 days)    No results for input(s): BNP in the  last 8760 hours.    ProBNP (last 3 results)  Recent Labs (within last 365 days)    No results for input(s): PROBNP in the last 8760 hours.    CBG:  Last Labs     No results for input(s): GLUCAP in the last 168 hours.    Recent Results (from the past 240 hour(s))  Blood culture (routine x 2) Status: None   Collection Time: 11/29/15 5:40 AM  Result Value Ref Range Status   Specimen Description BLOOD LEFT FOREARM  Final   Special Requests BOTTLES DRAWN AEROBIC AND ANAEROBIC 5CC  Final   Culture Setup Time   Final    GRAM POSITIVE COCCI IN CLUSTERS IN BOTH AEROBIC AND ANAEROBIC BOTTLES CRITICAL RESULT CALLED TO, READ BACK BY AND VERIFIED WITH: Concepcion LivingH JOHNSON RN 2022 11/29/15 A BROWNING    Culture   Final    STAPHYLOCOCCUS AUREUS SUSCEPTIBILITIES PERFORMED ON PREVIOUS CULTURE WITHIN THE LAST 5 DAYS. [facility]    Report Status 12/01/2015 FINAL  Final  Blood culture (routine x 2) Status: None   Collection Time: 11/29/15 5:45 AM  Result Value Ref Range Status   Specimen Description BLOOD RIGHT FOREARM  Final   Special Requests BOTTLES DRAWN AEROBIC AND ANAEROBIC 6CC  Final   Culture Setup Time   Final    GRAM POSITIVE COCCI IN CLUSTERS IN BOTH AEROBIC AND ANAEROBIC BOTTLES CRITICAL RESULT CALLED TO, READ BACK BY AND VERIFIED WITHConcepcion Living: H JOHNSON RN 2022 11/29/15 A BROWNING    Culture   Final    STAPHYLOCOCCUS AUREUS Performed at [facility]    Report Status 12/01/2015 FINAL  Final   Organism ID, Bacteria STAPHYLOCOCCUS AUREUS  Final   Susceptibility   Staphylococcus aureus - MIC*    CIPROFLOXACIN <=0.5 SENSITIVE Sensitive     ERYTHROMYCIN 0.5 SENSITIVE Sensitive     GENTAMICIN <=0.5 SENSITIVE Sensitive     OXACILLIN 0.5 SENSITIVE Sensitive     TETRACYCLINE >=16 RESISTANT Resistant     VANCOMYCIN 1 SENSITIVE Sensitive      TRIMETH/SULFA <=10 SENSITIVE Sensitive     CLINDAMYCIN <=0.25 SENSITIVE Sensitive     RIFAMPIN <=0.5 SENSITIVE Sensitive  Inducible Clindamycin NEGATIVE Sensitive    * STAPHYLOCOCCUS AUREUS  Culture, Urine Status: None   Collection Time: 11/29/15 6:18 AM  Result Value Ref Range Status   Specimen Description URINE, CLEAN CATCH  Final   Special Requests NONE  Final   Culture   Final    >=100,000 COLONIES/mL ESCHERICHIA COLI Performed at [facility]    Report Status 12/01/2015 FINAL  Final   Organism ID, Bacteria ESCHERICHIA COLI  Final   Susceptibility   Escherichia coli - MIC*    AMPICILLIN >=32 RESISTANT Resistant     CEFAZOLIN <=4 SENSITIVE Sensitive     CEFTRIAXONE <=1 SENSITIVE Sensitive     CIPROFLOXACIN <=0.25 SENSITIVE Sensitive     GENTAMICIN <=1 SENSITIVE Sensitive     IMIPENEM <=0.25 SENSITIVE Sensitive     NITROFURANTOIN <=16 SENSITIVE Sensitive     TRIMETH/SULFA >=320 RESISTANT Resistant     AMPICILLIN/SULBACTAM >=32 RESISTANT Resistant     PIP/TAZO <=4 SENSITIVE Sensitive    * >=100,000 COLONIES/mL ESCHERICHIA COLI  Culture, sputum-assessment Status: None   Collection Time: 11/29/15 8:22 AM  Result Value Ref Range Status   Specimen Description SPUTUM  Final   Special Requests NONE  Final   Sputum evaluation   Final    THIS SPECIMEN IS ACCEPTABLE. RESPIRATORY CULTURE REPORT TO FOLLOW.   Report Status 11/29/2015 FINAL  Final  Culture, respiratory (NON-Expectorated) Status: None   Collection Time: 11/29/15 8:22 AM  Result Value Ref Range Status   Specimen Description SPUTUM  Final   Special Requests NONE  Final   Gram Stain   Final    ABUNDANT WBC PRESENT, PREDOMINANTLY PMN ABUNDANT SQUAMOUS EPITHELIAL CELLS PRESENT ABUNDANT GRAM POSITIVE COCCI IN  PAIRS IN CLUSTERS MODERATE GRAM NEGATIVE RODS Performed at [facility]    Culture   Final    RARE GROUP A STREP (S.PYOGENES) ISOLATED Note: Beta hemolytic streptococci are predictably susceptible to penicillin and other beta lactams. Susceptibility testing not routinely performed. Performed at [facility]    Report Status 12/01/2015 FINAL  Final  MRSA PCR Screening Status: None   Collection Time: 11/29/15 11:41 AM  Result Value Ref Range Status   MRSA by PCR NEGATIVE NEGATIVE Final    Comment:   The GeneXpert MRSA Assay (FDA approved for NASAL specimens only), is one component of a comprehensive MRSA colonization surveillance program. It is not intended to diagnose MRSA infection nor to guide or monitor treatment for MRSA infections.   Culture, blood (routine x 2) Status: None (Preliminary result)   Collection Time: 11/30/15 12:35 PM  Result Value Ref Range Status   Specimen Description BLOOD RIGHT ARM  Final   Special Requests BOTTLES DRAWN AEROBIC AND ANAEROBIC 10CC  Final   Culture   Final    NO GROWTH 2 DAYS Performed at [facility]    Report Status PENDING  Incomplete  Culture, blood (routine x 2) Status: None   Collection Time: 11/30/15 12:40 PM  Result Value Ref Range Status   Specimen Description BLOOD RIGHT ARM  Final   Special Requests IN PEDIATRIC BOTTLE 1CC  Final   Culture Setup Time   Final    GRAM POSITIVE COCCI IN CLUSTERS AEROBIC BOTTLE ONLY CRITICAL RESULT CALLED TO, READ BACK BY AND VERIFIED WITH: Drucilla Schmidt @0559  12/01/15 MKELLY    Culture   Final    STAPHYLOCOCCUS AUREUS SUSCEPTIBILITIES PERFORMED ON PREVIOUS CULTURE WITHIN THE LAST 5 DAYS. Performed at [facility]    Report  Status 12/02/2015 FINAL  Final     Studies:  Imaging Results (Last 48 hours)    Dg Chest Port 1 View  12/02/2015  CLINICAL DATA: Respiratory failure EXAM: PORTABLE CHEST 1 VIEW COMPARISON: 11/29/2015 FINDINGS: Cardiac shadow is within normal limits. Patchy nodular changes are again seen throughout both lungs similar to that noted on prior CT examination. No focal confluent infiltrate is seen. No bony abnormality is noted. IMPRESSION: Patchy nodular changes in both lungs similar to that seen on the prior exam. Electronically Signed By: Alcide Clever M.D. On: 12/02/2015 07:13     Scheduled Meds:  Scheduled Meds: . ceFAZolin (ANCEF) IV 2 g Intravenous 3 times per day  . chlorpheniramine-HYDROcodone 5 mL Oral Q12H  . enoxaparin (LOVENOX) injection 40 mg Subcutaneous Q24H  . nicotine 21 mg Transdermal Daily   Continuous Infusions:    Time spent on care of this patient: 35 min   Amere Iott, MD 12/03/2015, 8:41 AM  LOS: 4 days   [facility] Office [phone] Pager - Text Page per [email or web address] If 7PM-7AM, please contact night-coverage [email or web address]

## 2015-12-13 ENCOUNTER — Inpatient Hospital Stay (HOSPITAL_COMMUNITY): Payer: Self-pay

## 2015-12-13 ENCOUNTER — Encounter (HOSPITAL_COMMUNITY): Payer: Self-pay | Admitting: Family Medicine

## 2015-12-13 ENCOUNTER — Inpatient Hospital Stay (HOSPITAL_COMMUNITY)
Admission: AD | Admit: 2015-12-13 | Discharge: 2016-01-05 | DRG: 853 | Disposition: A | Discharge status: Skilled Nursing Facility | Payer: Self-pay | Source: Other Acute Inpatient Hospital | Attending: Thoracic Surgery (Cardiothoracic Vascular Surgery) | Admitting: Thoracic Surgery (Cardiothoracic Vascular Surgery)

## 2015-12-13 DIAGNOSIS — Z9689 Presence of other specified functional implants: Secondary | ICD-10-CM

## 2015-12-13 DIAGNOSIS — I071 Rheumatic tricuspid insufficiency: Secondary | ICD-10-CM

## 2015-12-13 DIAGNOSIS — M25512 Pain in left shoulder: Secondary | ICD-10-CM | POA: Diagnosis present

## 2015-12-13 DIAGNOSIS — Z09 Encounter for follow-up examination after completed treatment for conditions other than malignant neoplasm: Secondary | ICD-10-CM

## 2015-12-13 DIAGNOSIS — I82401 Acute embolism and thrombosis of unspecified deep veins of right lower extremity: Secondary | ICD-10-CM

## 2015-12-13 DIAGNOSIS — I2690 Septic pulmonary embolism without acute cor pulmonale: Secondary | ICD-10-CM | POA: Diagnosis present

## 2015-12-13 DIAGNOSIS — F17210 Nicotine dependence, cigarettes, uncomplicated: Secondary | ICD-10-CM | POA: Diagnosis present

## 2015-12-13 DIAGNOSIS — F603 Borderline personality disorder: Secondary | ICD-10-CM | POA: Insufficient documentation

## 2015-12-13 DIAGNOSIS — D638 Anemia in other chronic diseases classified elsewhere: Secondary | ICD-10-CM | POA: Diagnosis present

## 2015-12-13 DIAGNOSIS — R0602 Shortness of breath: Secondary | ICD-10-CM

## 2015-12-13 DIAGNOSIS — I361 Nonrheumatic tricuspid (valve) insufficiency: Secondary | ICD-10-CM | POA: Diagnosis present

## 2015-12-13 DIAGNOSIS — G894 Chronic pain syndrome: Secondary | ICD-10-CM | POA: Diagnosis present

## 2015-12-13 DIAGNOSIS — D65 Disseminated intravascular coagulation [defibrination syndrome]: Secondary | ICD-10-CM | POA: Diagnosis present

## 2015-12-13 DIAGNOSIS — I38 Endocarditis, valve unspecified: Secondary | ICD-10-CM

## 2015-12-13 DIAGNOSIS — I330 Acute and subacute infective endocarditis: Secondary | ICD-10-CM | POA: Diagnosis present

## 2015-12-13 DIAGNOSIS — R079 Chest pain, unspecified: Secondary | ICD-10-CM | POA: Diagnosis present

## 2015-12-13 DIAGNOSIS — F1910 Other psychoactive substance abuse, uncomplicated: Secondary | ICD-10-CM

## 2015-12-13 DIAGNOSIS — B958 Unspecified staphylococcus as the cause of diseases classified elsewhere: Secondary | ICD-10-CM | POA: Diagnosis present

## 2015-12-13 DIAGNOSIS — M25519 Pain in unspecified shoulder: Secondary | ICD-10-CM

## 2015-12-13 DIAGNOSIS — I76 Septic arterial embolism: Secondary | ICD-10-CM | POA: Diagnosis present

## 2015-12-13 DIAGNOSIS — D62 Acute posthemorrhagic anemia: Secondary | ICD-10-CM | POA: Diagnosis not present

## 2015-12-13 DIAGNOSIS — D6959 Other secondary thrombocytopenia: Secondary | ICD-10-CM | POA: Diagnosis present

## 2015-12-13 DIAGNOSIS — F604 Histrionic personality disorder: Secondary | ICD-10-CM | POA: Insufficient documentation

## 2015-12-13 DIAGNOSIS — S0083XA Contusion of other part of head, initial encounter: Secondary | ICD-10-CM

## 2015-12-13 DIAGNOSIS — B1920 Unspecified viral hepatitis C without hepatic coma: Secondary | ICD-10-CM | POA: Diagnosis present

## 2015-12-13 DIAGNOSIS — Z452 Encounter for adjustment and management of vascular access device: Secondary | ICD-10-CM

## 2015-12-13 DIAGNOSIS — R52 Pain, unspecified: Secondary | ICD-10-CM

## 2015-12-13 DIAGNOSIS — B182 Chronic viral hepatitis C: Secondary | ICD-10-CM | POA: Diagnosis present

## 2015-12-13 DIAGNOSIS — Q2112 Patent foramen ovale: Secondary | ICD-10-CM

## 2015-12-13 DIAGNOSIS — F329 Major depressive disorder, single episode, unspecified: Secondary | ICD-10-CM | POA: Diagnosis present

## 2015-12-13 DIAGNOSIS — E876 Hypokalemia: Secondary | ICD-10-CM | POA: Diagnosis present

## 2015-12-13 DIAGNOSIS — R7881 Bacteremia: Secondary | ICD-10-CM

## 2015-12-13 DIAGNOSIS — I442 Atrioventricular block, complete: Secondary | ICD-10-CM | POA: Insufficient documentation

## 2015-12-13 DIAGNOSIS — G4700 Insomnia, unspecified: Secondary | ICD-10-CM | POA: Diagnosis not present

## 2015-12-13 DIAGNOSIS — R1011 Right upper quadrant pain: Secondary | ICD-10-CM

## 2015-12-13 DIAGNOSIS — R071 Chest pain on breathing: Secondary | ICD-10-CM

## 2015-12-13 DIAGNOSIS — Z9119 Patient's noncompliance with other medical treatment and regimen: Secondary | ICD-10-CM

## 2015-12-13 DIAGNOSIS — B9562 Methicillin resistant Staphylococcus aureus infection as the cause of diseases classified elsewhere: Secondary | ICD-10-CM | POA: Diagnosis present

## 2015-12-13 DIAGNOSIS — J939 Pneumothorax, unspecified: Secondary | ICD-10-CM

## 2015-12-13 DIAGNOSIS — K5900 Constipation, unspecified: Secondary | ICD-10-CM | POA: Diagnosis present

## 2015-12-13 DIAGNOSIS — Z954 Presence of other heart-valve replacement: Secondary | ICD-10-CM

## 2015-12-13 DIAGNOSIS — J9811 Atelectasis: Secondary | ICD-10-CM | POA: Diagnosis not present

## 2015-12-13 DIAGNOSIS — A4101 Sepsis due to Methicillin susceptible Staphylococcus aureus: Principal | ICD-10-CM | POA: Diagnosis present

## 2015-12-13 DIAGNOSIS — Q211 Atrial septal defect: Secondary | ICD-10-CM

## 2015-12-13 DIAGNOSIS — I82629 Acute embolism and thrombosis of deep veins of unspecified upper extremity: Secondary | ICD-10-CM

## 2015-12-13 DIAGNOSIS — F4310 Post-traumatic stress disorder, unspecified: Secondary | ICD-10-CM | POA: Diagnosis present

## 2015-12-13 LAB — COMPREHENSIVE METABOLIC PANEL
ALK PHOS: 152 U/L — AB (ref 38–126)
ALT: 26 U/L (ref 14–54)
ANION GAP: 9 (ref 5–15)
AST: 33 U/L (ref 15–41)
Albumin: 1.5 g/dL — ABNORMAL LOW (ref 3.5–5.0)
BILIRUBIN TOTAL: 1 mg/dL (ref 0.3–1.2)
BUN: 10 mg/dL (ref 6–20)
CALCIUM: 7.8 mg/dL — AB (ref 8.9–10.3)
CO2: 29 mmol/L (ref 22–32)
Chloride: 97 mmol/L — ABNORMAL LOW (ref 101–111)
Creatinine, Ser: 0.69 mg/dL (ref 0.44–1.00)
Glucose, Bld: 161 mg/dL — ABNORMAL HIGH (ref 65–99)
Potassium: 3.3 mmol/L — ABNORMAL LOW (ref 3.5–5.1)
SODIUM: 135 mmol/L (ref 135–145)
TOTAL PROTEIN: 5.7 g/dL — AB (ref 6.5–8.1)

## 2015-12-13 LAB — LACTATE DEHYDROGENASE: LDH: 202 U/L — AB (ref 98–192)

## 2015-12-13 LAB — CBC WITH DIFFERENTIAL/PLATELET
Basophils Absolute: 0 10*3/uL (ref 0.0–0.1)
Basophils Relative: 0 %
EOS ABS: 0 10*3/uL (ref 0.0–0.7)
Eosinophils Relative: 0 %
HCT: 23.5 % — ABNORMAL LOW (ref 36.0–46.0)
HEMOGLOBIN: 7.8 g/dL — AB (ref 12.0–15.0)
LYMPHS ABS: 1 10*3/uL (ref 0.7–4.0)
Lymphocytes Relative: 11 %
MCH: 28.4 pg (ref 26.0–34.0)
MCHC: 33.2 g/dL (ref 30.0–36.0)
MCV: 85.5 fL (ref 78.0–100.0)
MONOS PCT: 6 %
Monocytes Absolute: 0.6 10*3/uL (ref 0.1–1.0)
NEUTROS PCT: 82 %
Neutro Abs: 7.3 10*3/uL (ref 1.7–7.7)
Platelets: 72 10*3/uL — ABNORMAL LOW (ref 150–400)
RBC: 2.75 MIL/uL — ABNORMAL LOW (ref 3.87–5.11)
RDW: 15.2 % (ref 11.5–15.5)
WBC: 8.9 10*3/uL (ref 4.0–10.5)

## 2015-12-13 LAB — FERRITIN: Ferritin: 439 ng/mL — ABNORMAL HIGH (ref 11–307)

## 2015-12-13 MED ORDER — KETOROLAC TROMETHAMINE 15 MG/ML IJ SOLN
15.0000 mg | Freq: Four times a day (QID) | INTRAMUSCULAR | Status: DC | PRN
  Administered 2015-12-13 (×3): 15 mg via INTRAVENOUS
  Filled 2015-12-13 (×5): qty 1

## 2015-12-13 MED ORDER — BUSPIRONE HCL 15 MG PO TABS
7.5000 mg | ORAL_TABLET | Freq: Two times a day (BID) | ORAL | Status: DC
Start: 2015-12-13 — End: 2015-12-17
  Administered 2015-12-13 – 2015-12-17 (×8): 7.5 mg via ORAL
  Filled 2015-12-13 (×9): qty 1

## 2015-12-13 MED ORDER — CLONIDINE HCL 0.1 MG PO TABS
0.1000 mg | ORAL_TABLET | Freq: Two times a day (BID) | ORAL | Status: DC
Start: 2015-12-13 — End: 2015-12-22
  Administered 2015-12-13 – 2015-12-22 (×17): 0.1 mg via ORAL
  Filled 2015-12-13 (×18): qty 1

## 2015-12-13 MED ORDER — CHLORDIAZEPOXIDE HCL 25 MG PO CAPS: 25.0000 mg | ORAL_CAPSULE | Freq: Four times a day (QID) | ORAL | Status: DC | PRN

## 2015-12-13 MED ORDER — CHLORDIAZEPOXIDE HCL 25 MG PO CAPS: 25.0000 mg | ORAL_CAPSULE | Freq: Every day | ORAL | Status: DC

## 2015-12-13 MED ORDER — VITAMIN B-1 100 MG PO TABS
100.0000 mg | ORAL_TABLET | Freq: Every day | ORAL | Status: DC
  Administered 2015-12-14 – 2016-01-05 (×23): 100 mg via ORAL
  Filled 2015-12-13 (×22): qty 1

## 2015-12-13 MED ORDER — CEFAZOLIN SODIUM-DEXTROSE 2-4 GM/100ML-% IV SOLN
2.0000 g | Freq: Three times a day (TID) | INTRAVENOUS | Status: DC
  Administered 2015-12-13 – 2016-01-05 (×69): 2 g via INTRAVENOUS
  Filled 2015-12-13 (×80): qty 100

## 2015-12-13 MED ORDER — CHLORDIAZEPOXIDE HCL 25 MG PO CAPS
25.0000 mg | ORAL_CAPSULE | Freq: Three times a day (TID) | ORAL | Status: DC
  Administered 2015-12-14 (×2): 25 mg via ORAL
  Filled 2015-12-13 (×2): qty 1

## 2015-12-13 MED ORDER — CHLORDIAZEPOXIDE HCL 25 MG PO CAPS
25.0000 mg | ORAL_CAPSULE | Freq: Once | ORAL | Status: AC
  Administered 2015-12-13: 25 mg via ORAL
  Filled 2015-12-13: qty 1

## 2015-12-13 MED ORDER — ONDANSETRON HCL 4 MG/2ML IJ SOLN
4.0000 mg | Freq: Four times a day (QID) | INTRAMUSCULAR | Status: DC | PRN
  Filled 2015-12-13: qty 2

## 2015-12-13 MED ORDER — THIAMINE HCL 100 MG/ML IJ SOLN
100.0000 mg | Freq: Once | INTRAMUSCULAR | Status: AC
Start: 2015-12-13 — End: 2015-12-13
  Administered 2015-12-13: 100 mg via INTRAMUSCULAR
  Filled 2015-12-13: qty 2

## 2015-12-13 MED ORDER — ACETAMINOPHEN 650 MG RE SUPP: 650.0000 mg | Freq: Four times a day (QID) | RECTAL | Status: DC | PRN

## 2015-12-13 MED ORDER — CHLORDIAZEPOXIDE HCL 25 MG PO CAPS: 25.0000 mg | ORAL_CAPSULE | ORAL | Status: DC

## 2015-12-13 MED ORDER — OXYCODONE HCL 5 MG PO TABS
5.0000 mg | ORAL_TABLET | ORAL | Status: DC | PRN
  Administered 2015-12-13 – 2015-12-21 (×36): 5 mg via ORAL
  Filled 2015-12-13 (×36): qty 1

## 2015-12-13 MED ORDER — SODIUM CHLORIDE 0.9% FLUSH
3.0000 mL | Freq: Two times a day (BID) | INTRAVENOUS | Status: DC
  Administered 2015-12-13 – 2015-12-22 (×15): 3 mL via INTRAVENOUS

## 2015-12-13 MED ORDER — ACETAMINOPHEN 325 MG PO TABS
650.0000 mg | ORAL_TABLET | Freq: Four times a day (QID) | ORAL | Status: DC | PRN
  Administered 2015-12-13 – 2015-12-21 (×10): 650 mg via ORAL
  Filled 2015-12-13 (×10): qty 2

## 2015-12-13 MED ORDER — ONDANSETRON HCL 4 MG PO TABS: 4.0000 mg | ORAL_TABLET | Freq: Four times a day (QID) | ORAL | Status: DC | PRN

## 2015-12-13 MED ORDER — LOPERAMIDE HCL 2 MG PO CAPS: 2.0000 mg | ORAL_CAPSULE | ORAL | Status: DC | PRN

## 2015-12-13 MED ORDER — CHLORDIAZEPOXIDE HCL 25 MG PO CAPS
25.0000 mg | ORAL_CAPSULE | Freq: Four times a day (QID) | ORAL | Status: DC
  Administered 2015-12-13 (×2): 25 mg via ORAL
  Filled 2015-12-13 (×2): qty 1

## 2015-12-13 MED ORDER — ENOXAPARIN SODIUM 40 MG/0.4ML ~~LOC~~ SOLN: 40.0000 mg | SUBCUTANEOUS | Status: DC

## 2015-12-13 MED ORDER — ADULT MULTIVITAMIN W/MINERALS CH
1.0000 | ORAL_TABLET | Freq: Every day | ORAL | Status: DC
  Administered 2015-12-13 – 2016-01-05 (×24): 1 via ORAL
  Filled 2015-12-13 (×25): qty 1

## 2015-12-13 MED ORDER — HYDROXYZINE HCL 25 MG PO TABS
25.0000 mg | ORAL_TABLET | Freq: Four times a day (QID) | ORAL | Status: DC | PRN
  Administered 2015-12-15 (×4): 25 mg via ORAL
  Filled 2015-12-13 (×3): qty 1

## 2015-12-13 MED ORDER — NICOTINE 21 MG/24HR TD PT24
21.0000 mg | MEDICATED_PATCH | Freq: Every day | TRANSDERMAL | Status: DC
  Administered 2015-12-13 – 2016-01-05 (×24): 21 mg via TRANSDERMAL
  Filled 2015-12-13 (×25): qty 1

## 2015-12-13 MED ORDER — ONDANSETRON 4 MG PO TBDP
4.0000 mg | ORAL_TABLET | Freq: Four times a day (QID) | ORAL | Status: DC | PRN
  Filled 2015-12-13: qty 1
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
## 2015-12-13 NOTE — NC FL2 (Signed)
[place] MEDICAID FL2 LEVEL OF CARE SCREENING TOOL     IDENTIFICATION  Patient Name: Madeline Andrade Birthdate: 11-06-1990 Sex: female Admission Date (Current Location): 12/13/2015  [facility] and [place] Number:  Producer, television/film/video and Address:  [facility], [address], [place], [place] [address]      Provider Number: [id number]  Attending Physician Name and Address:  Rhetta Mura, MD  Relative Name and Phone Number:       Current Level of Care: Hospital Recommended Level of Care: Skilled Nursing Facility Prior Approval Number:    Date Approved/Denied:   PASRR Number: [id number]  Discharge Plan: SNF    Current Diagnoses: Patient Active Problem List   Diagnosis Date Noted  . Endocarditis 12/04/2015  . Bacteremia due to Staphylococcus aureus- MSSA 12/03/2015  . Respiratory failure (HCC)   . Polysubstance abuse   . 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 IV drug abuse   . Hepatitis C 11/29/2015  . Sepsis (HCC) 11/29/2015  . Thrombocytopenia (HCC) 11/29/2015  . Hypokalemia 11/29/2015  . Anemia 11/29/2015  . Heroin abuse 11/29/2015    Orientation RESPIRATION BLADDER Height & Weight     Self, Time, Situation, Place  Normal Continent Weight: 119 lb 12.8 oz (54.341 kg) (a scale) Height:     BEHAVIORAL SYMPTOMS/MOOD NEUROLOGICAL BOWEL NUTRITION STATUS      Continent Diet  AMBULATORY STATUS COMMUNICATION OF NEEDS Skin   Independent Verbally Normal                       Personal Care Assistance Level of Assistance  Bathing, Feeding, Dressing Bathing Assistance: Independent Feeding assistance: Independent Dressing Assistance: Independent     Functional Limitations Info  Sight, Hearing, Speech Sight Info: Adequate Hearing Info: Adequate Speech Info: Adequate    SPECIAL CARE FACTORS FREQUENCY                       Contractures      Additional Factors Info  Code  Status Code Status Info: full code             Current Medications (12/13/2015):  This is the current hospital active medication list Current Facility-Administered Medications  Medication Dose Route Frequency Provider Last Rate Last Dose  . acetaminophen (TYLENOL) tablet 650 mg  650 mg Oral Q6H PRN Alberteen Sam, MD       Or  . acetaminophen (TYLENOL) suppository 650 mg  650 mg Rectal Q6H PRN Alberteen Sam, MD      . ceFAZolin (ANCEF) IVPB 2g/100 mL premix  2 g Intravenous 3 times per day Alberteen Sam, MD   2 g at 12/13/15 1357  . chlordiazePOXIDE (LIBRIUM) capsule 25 mg  25 mg Oral Q6H PRN Rhetta Mura, MD      . chlordiazePOXIDE (LIBRIUM) capsule 25 mg  25 mg Oral QID Rhetta Mura, MD   25 mg at 12/13/15 1115   Followed by  . [START ON 12/14/2015] chlordiazePOXIDE (LIBRIUM) capsule 25 mg  25 mg Oral TID Rhetta Mura, MD       Followed by  . [START ON 12/15/2015] chlordiazePOXIDE (LIBRIUM) capsule 25 mg  25 mg Oral BH-qamhs Rhetta Mura, MD       Followed by  . [START ON 12/16/2015] chlordiazePOXIDE (LIBRIUM) capsule 25 mg  25 mg Oral Daily Rhetta Mura, MD      . hydrOXYzine (ATARAX/VISTARIL) tablet 25 mg  25 mg Oral Q6H PRN Rhetta MuraJai-Gurmukh Samtani, MD      . ketorolac (TORADOL) 15 MG/ML injection 15 mg  15 mg Intravenous Q6H PRN Alberteen Samhristopher P Danford, MD   15 mg at 12/13/15 1357  . loperamide (IMODIUM) capsule 2-4 mg  2-4 mg Oral PRN Rhetta MuraJai-Gurmukh Samtani, MD      . multivitamin with minerals tablet 1 tablet  1 tablet Oral Daily Rhetta MuraJai-Gurmukh Samtani, MD   1 tablet at 12/13/15 1141  . ondansetron (ZOFRAN) tablet 4 mg  4 mg Oral Q6H PRN Alberteen Samhristopher P Danford, MD       Or  . ondansetron (ZOFRAN) injection 4 mg  4 mg Intravenous Q6H PRN Alberteen Samhristopher P Danford, MD      . ondansetron (ZOFRAN-ODT) disintegrating tablet 4 mg  4 mg Oral Q6H PRN Rhetta MuraJai-Gurmukh Samtani, MD      . oxyCODONE (Oxy IR/ROXICODONE) immediate release tablet 5 mg  5 mg Oral Q3H  PRN Rhetta MuraJai-Gurmukh Samtani, MD   5 mg at 12/13/15 1141  . sodium chloride flush (NS) 0.9 % injection 3 mL  3 mL Intravenous Q12H Alberteen Samhristopher P Danford, MD   3 mL at 12/13/15 1000  . [START ON 12/14/2015] thiamine (VITAMIN B-1) tablet 100 mg  100 mg Oral Daily Rhetta MuraJai-Gurmukh Samtani, MD         Discharge Medications: Please see discharge summary for a list of discharge medications.  Relevant Imaging Results:  Relevant Lab Results:   Additional Information ss # [id number].   Pt requires 4-6 weeks of IV antibiotics.   Loleta DickerJoyce S Marcena Dias, LCSW
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
## 2015-12-13 NOTE — Progress Notes (Signed)
Patient complaining of severe pain and received toradol at 6:30am.  Ice pack given and Dr. Mahala MenghiniSamtani made aware.  Will continue to monitor.
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
## 2015-12-13 NOTE — Progress Notes (Signed)
Pt still c/o pain after toradol this AM, pt requesting dilaudid. Dr. Mahala MenghiniSamtani paged and passed on to day shift RN
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
## 2015-12-13 NOTE — Progress Notes (Signed)
Pt arrived to floor c/o pain. Per EMS pt received dilaudid prior to transport. Pt A/Ox4, assist x1 to get up. Pt oriented to room and floor, call bell within reach. Will continue to monitor. Huel Coventryosenberger, Medard Decuir A, RN
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
## 2015-12-13 NOTE — Progress Notes (Signed)
[facility] preliminary results: Gram Positive Cocci. Notified Mahala MenghiniSamtani, MD.

## 2015-12-14 ENCOUNTER — Inpatient Hospital Stay (HOSPITAL_COMMUNITY): Payer: Self-pay

## 2015-12-14 DIAGNOSIS — R0789 Other chest pain: Secondary | ICD-10-CM

## 2015-12-14 LAB — BASIC METABOLIC PANEL
Anion gap: 12 (ref 5–15)
BUN: 9 mg/dL (ref 6–20)
CO2: 25 mmol/L (ref 22–32)
CREATININE: 0.69 mg/dL (ref 0.44–1.00)
Calcium: 7.7 mg/dL — ABNORMAL LOW (ref 8.9–10.3)
Chloride: 98 mmol/L — ABNORMAL LOW (ref 101–111)
GFR calc Af Amer: >60 mL/min (ref >60)
GLUCOSE: 131 mg/dL — AB (ref 65–99)
POTASSIUM: 3.5 mmol/L (ref 3.5–5.1)
SODIUM: 135 mmol/L (ref 135–145)

## 2015-12-14 MED ORDER — KETOROLAC TROMETHAMINE 15 MG/ML IJ SOLN
7.5000 mg | Freq: Four times a day (QID) | INTRAMUSCULAR | Status: AC | PRN
  Administered 2015-12-14 – 2015-12-17 (×9): 7.5 mg via INTRAVENOUS
  Filled 2015-12-14 (×14): qty 1
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
## 2015-12-14 NOTE — Progress Notes (Signed)
Taken to radiology for xray of shoulder
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
## 2015-12-15 LAB — CBC
HCT: 24 % — ABNORMAL LOW (ref 36.0–46.0)
HEMOGLOBIN: 7.7 g/dL — AB (ref 12.0–15.0)
MCH: 27.3 pg (ref 26.0–34.0)
MCHC: 32.1 g/dL (ref 30.0–36.0)
MCV: 85.1 fL (ref 78.0–100.0)
PLATELETS: 107 10*3/uL — AB (ref 150–400)
RBC: 2.82 MIL/uL — ABNORMAL LOW (ref 3.87–5.11)
RDW: 15.2 % (ref 11.5–15.5)
WBC: 14.2 10*3/uL — ABNORMAL HIGH (ref 4.0–10.5)

## 2015-12-15 LAB — BASIC METABOLIC PANEL
ANION GAP: 12 (ref 5–15)
BUN: 11 mg/dL (ref 6–20)
CALCIUM: 8 mg/dL — AB (ref 8.9–10.3)
CO2: 24 mmol/L (ref 22–32)
CREATININE: 0.59 mg/dL (ref 0.44–1.00)
Chloride: 101 mmol/L (ref 101–111)
GFR calc Af Amer: >60 mL/min (ref >60)
GLUCOSE: 111 mg/dL — AB (ref 65–99)
Potassium: 4.1 mmol/L (ref 3.5–5.1)
Sodium: 137 mmol/L (ref 135–145)

## 2015-12-15 MED ORDER — POLYETHYLENE GLYCOL 3350 17 G PO PACK
17.0000 g | PACK | Freq: Every day | ORAL | Status: DC
  Administered 2015-12-15 – 2015-12-19 (×4): 17 g via ORAL
  Filled 2015-12-15 (×6): qty 1

## 2015-12-15 MED ORDER — SENNA 8.6 MG PO TABS
2.0000 | ORAL_TABLET | Freq: Every day | ORAL | Status: DC
  Administered 2015-12-15 – 2015-12-19 (×4): 17.2 mg via ORAL
  Filled 2015-12-15 (×7): qty 2

## 2015-12-15 MED ORDER — ALUM & MAG HYDROXIDE-SIMETH 200-200-20 MG/5ML PO SUSP
30.0000 mL | ORAL | Status: DC | PRN
  Administered 2015-12-15 – 2015-12-18 (×3): 30 mL via ORAL
  Filled 2015-12-15 (×3): qty 30

## 2015-12-15 MED ORDER — FLEET ENEMA 7-19 GM/118ML RE ENEM
1.0000 | ENEMA | Freq: Every day | RECTAL | Status: DC | PRN
  Filled 2015-12-15: qty 1
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
## 2015-12-15 NOTE — Progress Notes (Signed)
CRITICAL VALUE ALERT  Critical value received:  Blood culture result staph aureius  Date of notification:  12/14/04  Time of notification:100pm Critical value read back:yes  Nurse who received alert: Hallie Ertl maria Coty Student Rn  MD notified (1st page):  Dr. Deeann Creegmire  Time of first page:  1300 MD notified (2nd page):  Time of second page:  Responding MD: Dr.Gmire  Time MD responded:1547
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
## 2015-12-15 NOTE — Clinical Social Work Note (Signed)
[place] MUST PASARR obtained by Madeline Andrade, CSW on 4/4: [id number]  Madeline Andrade, MSW, LCSWA [phone] 12/15/2015 6:03 PM

## 2015-12-15 NOTE — Clinical Social Work Placement (Signed)
   CLINICAL SOCIAL WORK PLACEMENT  NOTE  Date:  12/15/2015  Patient Details  Name: Madeline Andrade MRN: [id number] Date of Birth: 09/07/1991  Clinical Social Work is seeking post-discharge placement for this patient at the Skilled  Nursing Facility level of care (*CSW will initial, date and re-position this form in  chart as items are completed):  Yes   Patient/family provided with [place] Clinical Social Work Department's list of facilities offering this level of care within the geographic area requested by the patient (or if unable, by the patient's family).  Yes   Patient/family informed of their freedom to choose among providers that offer the needed level of care, that participate in Medicare, Medicaid or managed care program needed by the patient, have an available bed and are willing to accept the patient.  Yes   Patient/family informed of [facility]'s ownership interest in [facility] and [facility], as well as of the fact that they are under no obligation to receive care at these facilities.  PASRR submitted to EDS on 12/14/15     PASRR number received on 12/14/15     Existing PASRR number confirmed on       FL2 transmitted to all facilities in geographic area requested by pt/family on 12/14/15     FL2 transmitted to all facilities within larger geographic area on 12/14/15     Patient informed that his/her managed care company has contracts with or will negotiate with certain facilities, including the following:        No   Patient/family informed of bed offers received.  Patient chooses bed at       Physician recommends and patient chooses bed at      Patient to be transferred to   on  .  Patient to be transferred to facility by       Patient family notified on   of transfer.  Name of family member notified:        PHYSICIAN Please sign FL2     Additional Comment:    _______________________________________________ Vaughan BrownerNixon, Taneisha Fuson A,  LCSW 12/15/2015, 6:05 PM

## 2015-12-15 NOTE — Clinical Social Work Note (Signed)
Currently patient does NOT have any bed offers for SNF placement. [facility] due to history of IVDU. Patient will need 6 weeks of IV Abx.   CSW to explore alternative SNF options with Wellsite geologistMedical Director and CSW Director. CSW remains available as needed.   Derenda FennelBashira Javares Kaufhold, MSW, LCSWA [phone] 12/15/2015 5:43 PM

## 2015-12-15 NOTE — Progress Notes (Signed)
1230 [facility] , Gm positive cocci

## 2015-12-16 ENCOUNTER — Ambulatory Visit (HOSPITAL_COMMUNITY): Payer: Self-pay

## 2015-12-16 ENCOUNTER — Inpatient Hospital Stay (HOSPITAL_COMMUNITY): Payer: Self-pay

## 2015-12-16 DIAGNOSIS — I339 Acute and subacute endocarditis, unspecified: Secondary | ICD-10-CM

## 2015-12-16 LAB — ECHOCARDIOGRAM LIMITED
Height: 67 in
Weight: 1793.6 oz

## 2015-12-16 MED ORDER — SODIUM CHLORIDE 0.9 % IV SOLN
INTRAVENOUS | Status: DC
  Administered 2015-12-20: 12:00:00 via INTRAVENOUS

## 2015-12-16 NOTE — Progress Notes (Signed)
ATTEMPTED PT @ 7:30am AND PT REFUSED TO DO EXAM AND STATED THAT SHE FELT LIKE SHE NO LONGER NEEDED TO HAVE A MRI

## 2015-12-16 NOTE — Progress Notes (Signed)
Echocardiogram 2D Echocardiogram limited has been performed.  Madeline Andrade, [name] 12/16/2015, 10:19 AM

## 2015-12-16 NOTE — Progress Notes (Signed)
    [facility] has been requested to perform a transesophageal echocardiogram on Madeline Andrade for Bacteremia.  After careful review of history and examination, the risks and benefits of transesophageal echocardiogram have been explained including risks of esophageal damage, perforation (1:10,000 risk), bleeding, pharyngeal hematoma as well as other potential complications associated with conscious sedation including aspiration, arrhythmia, respiratory failure and death. Alternatives to treatment were discussed, questions were answered. Patient is willing to proceed.  TEE - Dr. Tenny Crawoss @ 1600. NPO after midnight. Meds with sips.   Madeline Glasco, PA-C 12/16/2015 2:41 PM
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
## 2015-12-17 ENCOUNTER — Inpatient Hospital Stay (HOSPITAL_COMMUNITY): Payer: Self-pay

## 2015-12-17 DIAGNOSIS — G4700 Insomnia, unspecified: Secondary | ICD-10-CM

## 2015-12-17 DIAGNOSIS — M25519 Pain in unspecified shoulder: Secondary | ICD-10-CM | POA: Insufficient documentation

## 2015-12-17 DIAGNOSIS — B958 Unspecified staphylococcus as the cause of diseases classified elsewhere: Secondary | ICD-10-CM | POA: Diagnosis present

## 2015-12-17 DIAGNOSIS — I330 Acute and subacute infective endocarditis: Secondary | ICD-10-CM

## 2015-12-17 LAB — CULTURE, BLOOD (ROUTINE X 2)

## 2015-12-17 LAB — IRON AND TIBC
Iron: 10 ug/dL — ABNORMAL LOW (ref 28–170)
SATURATION RATIOS: 4 % — AB (ref 10.4–31.8)
TIBC: 227 ug/dL — AB (ref 250–450)
UIBC: 217 ug/dL

## 2015-12-17 LAB — BASIC METABOLIC PANEL
Anion gap: 12 (ref 5–15)
BUN: 14 mg/dL (ref 6–20)
CHLORIDE: 97 mmol/L — AB (ref 101–111)
CO2: 23 mmol/L (ref 22–32)
Calcium: 8.3 mg/dL — ABNORMAL LOW (ref 8.9–10.3)
Creatinine, Ser: 0.63 mg/dL (ref 0.44–1.00)
GFR calc Af Amer: >60 mL/min (ref >60)
GFR calc non Af Amer: >60 mL/min (ref >60)
GLUCOSE: 119 mg/dL — AB (ref 65–99)
POTASSIUM: 4.5 mmol/L (ref 3.5–5.1)
Sodium: 132 mmol/L — ABNORMAL LOW (ref 135–145)

## 2015-12-17 LAB — FOLATE: FOLATE: 17.2 ng/mL (ref >5.9)

## 2015-12-17 LAB — VITAMIN B12: Vitamin B-12: 2594 pg/mL — ABNORMAL HIGH (ref 180–914)

## 2015-12-17 LAB — CBC
HCT: 25.7 % — ABNORMAL LOW (ref 36.0–46.0)
HEMOGLOBIN: 8.5 g/dL — AB (ref 12.0–15.0)
MCH: 27.8 pg (ref 26.0–34.0)
MCHC: 33.1 g/dL (ref 30.0–36.0)
MCV: 84 fL (ref 78.0–100.0)
Platelets: 174 10*3/uL (ref 150–400)
RBC: 3.06 MIL/uL — AB (ref 3.87–5.11)
RDW: 15.2 % (ref 11.5–15.5)
WBC: 19.8 10*3/uL — ABNORMAL HIGH (ref 4.0–10.5)

## 2015-12-17 LAB — RETICULOCYTES
RBC.: 3.06 MIL/uL — AB (ref 3.87–5.11)
RETIC CT PCT: 2.3 % (ref 0.4–3.1)
Retic Count, Absolute: 70.4 10*3/uL (ref 19.0–186.0)

## 2015-12-17 LAB — FERRITIN: Ferritin: 661 ng/mL — ABNORMAL HIGH (ref 11–307)

## 2015-12-17 MED ORDER — ZOLPIDEM TARTRATE 5 MG PO TABS
5.0000 mg | ORAL_TABLET | Freq: Every evening | ORAL | Status: DC | PRN
  Administered 2015-12-17 – 2015-12-21 (×4): 5 mg via ORAL
  Filled 2015-12-17 (×4): qty 1

## 2015-12-17 MED ORDER — ENOXAPARIN SODIUM 40 MG/0.4ML ~~LOC~~ SOLN
40.0000 mg | SUBCUTANEOUS | Status: DC
  Administered 2015-12-18 – 2015-12-21 (×3): 40 mg via SUBCUTANEOUS
  Filled 2015-12-17 (×3): qty 0.4

## 2015-12-17 MED ORDER — BUSPIRONE HCL 15 MG PO TABS
7.5000 mg | ORAL_TABLET | Freq: Once | ORAL | Status: AC
  Administered 2015-12-17: 7.5 mg via ORAL
  Filled 2015-12-17: qty 1

## 2015-12-17 MED ORDER — BUSPIRONE HCL 15 MG PO TABS
7.5000 mg | ORAL_TABLET | Freq: Three times a day (TID) | ORAL | Status: DC
  Administered 2015-12-17 – 2016-01-05 (×53): 7.5 mg via ORAL
  Filled 2015-12-17: qty 2
  Filled 2015-12-17 (×14): qty 1
  Filled 2015-12-17: qty 2
  Filled 2015-12-17 (×3): qty 1
  Filled 2015-12-17: qty 2
  Filled 2015-12-17 (×8): qty 1
  Filled 2015-12-17: qty 2
  Filled 2015-12-17: qty 1
  Filled 2015-12-17: qty 2
  Filled 2015-12-17: qty 1
  Filled 2015-12-17: qty 2
  Filled 2015-12-17 (×4): qty 1
  Filled 2015-12-17: qty 2
  Filled 2015-12-17 (×21): qty 1

## 2015-12-17 NOTE — Progress Notes (Signed)
Lab reports gram positive cooci in clusters from the aerobic bottle of blood cultures. MD paged last night. Will continue to monitor.
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
## 2015-12-17 NOTE — Progress Notes (Signed)
Pt for TEE at 4PM today. Family made aware while in room last night. Consent form filled out by RN, but pt will sign in AM. Pt NPO since midnight except sips with meds. Will continue to monitor.
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
## 2015-12-17 NOTE — Progress Notes (Addendum)
Pt refused daily weight this AM, then later got up and went from chair to bed. Will continue to monitor.

## 2015-12-18 LAB — CBC
HEMATOCRIT: 26.2 % — AB (ref 36.0–46.0)
HEMOGLOBIN: 8.1 g/dL — AB (ref 12.0–15.0)
MCH: 26.3 pg (ref 26.0–34.0)
MCHC: 30.9 g/dL (ref 30.0–36.0)
MCV: 85.1 fL (ref 78.0–100.0)
Platelets: 217 10*3/uL (ref 150–400)
RBC: 3.08 MIL/uL — ABNORMAL LOW (ref 3.87–5.11)
RDW: 15.1 % (ref 11.5–15.5)
WBC: 18 10*3/uL — ABNORMAL HIGH (ref 4.0–10.5)

## 2015-12-18 LAB — BASIC METABOLIC PANEL
Anion gap: 11 (ref 5–15)
BUN: 11 mg/dL (ref 6–20)
CHLORIDE: 100 mmol/L — AB (ref 101–111)
CO2: 19 mmol/L — AB (ref 22–32)
CREATININE: 0.4 mg/dL — AB (ref 0.44–1.00)
Calcium: 7.9 mg/dL — ABNORMAL LOW (ref 8.9–10.3)
GFR calc Af Amer: >60 mL/min (ref >60)
GFR calc non Af Amer: >60 mL/min (ref >60)
Glucose, Bld: 120 mg/dL — ABNORMAL HIGH (ref 65–99)
Potassium: 5.1 mmol/L (ref 3.5–5.1)
Sodium: 130 mmol/L — ABNORMAL LOW (ref 135–145)

## 2015-12-18 LAB — CULTURE, BLOOD (ROUTINE X 2)

## 2015-12-18 MED ORDER — SODIUM CHLORIDE 0.9 % IV SOLN
INTRAVENOUS | Status: DC
  Administered 2015-12-18: 21:00:00 via INTRAVENOUS

## 2015-12-18 MED ORDER — KETOROLAC TROMETHAMINE 30 MG/ML IJ SOLN
INTRAMUSCULAR | Status: AC
  Administered 2015-12-18: 7.5 mg
  Filled 2015-12-18: qty 1

## 2015-12-18 MED ORDER — IOHEXOL 300 MG/ML  SOLN
25.0000 mL | INTRAMUSCULAR | Status: AC
Start: 2015-12-18 — End: 2015-12-18

## 2015-12-18 MED ORDER — HYDROMORPHONE HCL 1 MG/ML IJ SOLN
1.0000 mg | INTRAMUSCULAR | Status: DC | PRN
  Administered 2015-12-18: 1 mg via INTRAVENOUS
  Administered 2015-12-18 – 2015-12-22 (×20): 2 mg via INTRAVENOUS
  Filled 2015-12-18 (×2): qty 2
  Filled 2015-12-18: qty 1
  Filled 2015-12-18 (×4): qty 2
  Filled 2015-12-18: qty 1
  Filled 2015-12-18 (×8): qty 2
  Filled 2015-12-18: qty 1
  Filled 2015-12-18 (×5): qty 2

## 2015-12-18 MED ORDER — NALOXONE HCL 0.4 MG/ML IJ SOLN: 0.4000 mg | INTRAMUSCULAR | Status: DC | PRN

## 2015-12-18 MED ORDER — OXYCODONE HCL ER 20 MG PO T12A
20.0000 mg | EXTENDED_RELEASE_TABLET | Freq: Two times a day (BID) | ORAL | Status: DC
Start: 2015-12-18 — End: 2015-12-22
  Administered 2015-12-18 – 2015-12-22 (×9): 20 mg via ORAL
  Filled 2015-12-18 (×9): qty 1
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
## 2015-12-19 ENCOUNTER — Inpatient Hospital Stay (HOSPITAL_COMMUNITY): Payer: Self-pay

## 2015-12-19 ENCOUNTER — Encounter (HOSPITAL_COMMUNITY): Payer: Self-pay | Admitting: Radiology

## 2015-12-19 LAB — CULTURE, BLOOD (ROUTINE X 2): Culture: NO GROWTH

## 2015-12-19 LAB — BASIC METABOLIC PANEL
ANION GAP: 11 (ref 5–15)
BUN: 10 mg/dL (ref 6–20)
CHLORIDE: 99 mmol/L — AB (ref 101–111)
CO2: 24 mmol/L (ref 22–32)
Calcium: 8.1 mg/dL — ABNORMAL LOW (ref 8.9–10.3)
Creatinine, Ser: 0.54 mg/dL (ref 0.44–1.00)
GFR calc Af Amer: >60 mL/min (ref >60)
GLUCOSE: 117 mg/dL — AB (ref 65–99)
POTASSIUM: 4.1 mmol/L (ref 3.5–5.1)
Sodium: 134 mmol/L — ABNORMAL LOW (ref 135–145)

## 2015-12-19 LAB — CBC
HEMATOCRIT: 22.9 % — AB (ref 36.0–46.0)
HEMOGLOBIN: 7.1 g/dL — AB (ref 12.0–15.0)
MCH: 26.3 pg (ref 26.0–34.0)
MCHC: 31 g/dL (ref 30.0–36.0)
MCV: 84.8 fL (ref 78.0–100.0)
Platelets: 275 10*3/uL (ref 150–400)
RBC: 2.7 MIL/uL — AB (ref 3.87–5.11)
RDW: 15.1 % (ref 11.5–15.5)
WBC: 18.3 10*3/uL — AB (ref 4.0–10.5)

## 2015-12-19 LAB — PREGNANCY, URINE: Preg Test, Ur: NEGATIVE

## 2015-12-19 MED ORDER — IOPAMIDOL (ISOVUE-300) INJECTION 61%
80.0000 mL | Freq: Once | INTRAVENOUS | Status: AC | PRN
  Administered 2015-12-19: 100 mL via INTRAVENOUS

## 2015-12-19 MED ORDER — NALOXEGOL OXALATE 12.5 MG PO TABS
12.5000 mg | ORAL_TABLET | Freq: Every day | ORAL | Status: DC
  Administered 2015-12-19 – 2016-01-05 (×18): 12.5 mg via ORAL
  Filled 2015-12-19 (×19): qty 1
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
## 2015-12-19 NOTE — Progress Notes (Signed)
Pt. Fall risk. Refused bed alarm.

## 2015-12-20 ENCOUNTER — Inpatient Hospital Stay (HOSPITAL_COMMUNITY): Payer: Self-pay

## 2015-12-20 ENCOUNTER — Encounter (HOSPITAL_COMMUNITY)
Admission: AD | Disposition: A | Discharge status: Skilled Nursing Facility | Payer: Self-pay | Source: Other Acute Inpatient Hospital | Attending: Thoracic Surgery (Cardiothoracic Vascular Surgery)

## 2015-12-20 ENCOUNTER — Inpatient Hospital Stay (HOSPITAL_COMMUNITY): Payer: Self-pay | Admitting: Anesthesiology

## 2015-12-20 ENCOUNTER — Encounter (HOSPITAL_COMMUNITY): Payer: Self-pay | Admitting: *Deleted

## 2015-12-20 DIAGNOSIS — Q2112 Patent foramen ovale: Secondary | ICD-10-CM

## 2015-12-20 DIAGNOSIS — I330 Acute and subacute infective endocarditis: Secondary | ICD-10-CM

## 2015-12-20 DIAGNOSIS — Q211 Atrial septal defect: Secondary | ICD-10-CM

## 2015-12-20 DIAGNOSIS — I361 Nonrheumatic tricuspid (valve) insufficiency: Secondary | ICD-10-CM

## 2015-12-20 HISTORY — PX: TEE WITHOUT CARDIOVERSION: SHX5443

## 2015-12-20 LAB — CBC
HCT: 21.6 % — ABNORMAL LOW (ref 36.0–46.0)
Hemoglobin: 6.9 g/dL — CL (ref 12.0–15.0)
MCH: 26.8 pg (ref 26.0–34.0)
MCHC: 31.9 g/dL (ref 30.0–36.0)
MCV: 84 fL (ref 78.0–100.0)
PLATELETS: 343 10*3/uL (ref 150–400)
RBC: 2.57 MIL/uL — AB (ref 3.87–5.11)
RDW: 15.3 % (ref 11.5–15.5)
WBC: 19.5 10*3/uL — ABNORMAL HIGH (ref 4.0–10.5)

## 2015-12-20 LAB — HEMOGLOBIN AND HEMATOCRIT, BLOOD
HEMATOCRIT: 29.8 % — AB (ref 36.0–46.0)
HEMOGLOBIN: 9.8 g/dL — AB (ref 12.0–15.0)

## 2015-12-20 LAB — PREPARE RBC (CROSSMATCH)

## 2015-12-20 SURGERY — ECHOCARDIOGRAM, TRANSESOPHAGEAL
Anesthesia: Monitor Anesthesia Care

## 2015-12-20 MED ORDER — ALBUMIN HUMAN 5 % IV SOLN
INTRAVENOUS | Status: DC | PRN
  Administered 2015-12-20: 13:00:00 via INTRAVENOUS

## 2015-12-20 MED ORDER — PHENYLEPHRINE HCL 10 MG/ML IJ SOLN
INTRAMUSCULAR | Status: DC | PRN
  Administered 2015-12-20: 160 ug via INTRAVENOUS
  Administered 2015-12-20: 80 ug via INTRAVENOUS
  Administered 2015-12-20 (×3): 160 ug via INTRAVENOUS
  Administered 2015-12-20: 80 ug via INTRAVENOUS

## 2015-12-20 MED ORDER — LACTATED RINGERS IV SOLN
INTRAVENOUS | Status: DC | PRN
  Administered 2015-12-20: 13:00:00 via INTRAVENOUS

## 2015-12-20 MED ORDER — DIPHENHYDRAMINE HCL 25 MG PO CAPS
25.0000 mg | ORAL_CAPSULE | Freq: Once | ORAL | Status: AC
  Administered 2015-12-20: 25 mg via ORAL
  Filled 2015-12-20: qty 1

## 2015-12-20 MED ORDER — EPHEDRINE SULFATE 50 MG/ML IJ SOLN
INTRAMUSCULAR | Status: DC | PRN
  Administered 2015-12-20 (×2): 15 mg via INTRAVENOUS

## 2015-12-20 MED ORDER — SODIUM CHLORIDE 0.9 % IV SOLN
Freq: Once | INTRAVENOUS | Status: AC
  Administered 2015-12-20: 06:00:00 via INTRAVENOUS

## 2015-12-20 MED ORDER — PROPOFOL 500 MG/50ML IV EMUL
INTRAVENOUS | Status: DC | PRN
  Administered 2015-12-20: 13:00:00 via INTRAVENOUS
  Administered 2015-12-20: 125 ug/kg/min via INTRAVENOUS

## 2015-12-20 MED ORDER — PROPOFOL 10 MG/ML IV BOLUS
INTRAVENOUS | Status: DC | PRN
  Administered 2015-12-20: 40 mg via INTRAVENOUS
  Administered 2015-12-20: 100 mg via INTRAVENOUS
  Administered 2015-12-20: 40 mg via INTRAVENOUS
  Administered 2015-12-20: 30 mg via INTRAVENOUS
  Administered 2015-12-20: 80 mg via INTRAVENOUS
  Administered 2015-12-20: 30 mg via INTRAVENOUS
  Administered 2015-12-20: 20 mg via INTRAVENOUS

## 2015-12-20 NOTE — Transfer of Care (Addendum)
Immediate Anesthesia Transfer of Care Note  Patient: Madeline ShropshireLori Kaye Harl  Procedure(s) Performed: Procedure(s): TRANSESOPHAGEAL ECHOCARDIOGRAM (TEE) (N/A)  Patient Location: Endoscopy Unit  Anesthesia Type:MAC  Level of Consciousness: lethargic and responds to stimulation  Airway & Oxygen Therapy: Patient Spontanous Breathing and Patient connected to nasal cannula oxygen  Post-op Assessment: Report given to RN and Post -op Vital signs reviewed and stable  Post vital signs: Reviewed and stable  Last Vitals:  Filed Vitals:   12/20/15 1158 12/20/15 1257  BP: 96/77 49/18  Pulse: 105   Temp: 36.6 C   Resp: 18 30    Complications: No apparent anesthesia complications, pt hypotensive, IV bolus given

## 2015-12-20 NOTE — Progress Notes (Signed)
Pt. With critical Hb of 6.9. Text page [facility] to [facility]. Tywon Niday, Cheryll DessertKaren Cherrell

## 2015-12-20 NOTE — Anesthesia Postprocedure Evaluation (Addendum)
Anesthesia Post Note  Patient: Madeline ShropshireLori Kaye Andrade  Procedure(s) Performed: Procedure(s) (LRB): TRANSESOPHAGEAL ECHOCARDIOGRAM (TEE) (N/A)  Patient location during evaluation: PACU Anesthesia Type: MAC Level of consciousness: awake and alert Pain management: pain level controlled Vital Signs Assessment: post-procedure vital signs reviewed and stable Respiratory status: spontaneous breathing, nonlabored ventilation, respiratory function stable and patient connected to nasal cannula oxygen Cardiovascular status: blood pressure returned to baseline and stable Postop Assessment: no signs of nausea or vomiting Anesthetic complications: no Comments: [name] is awake and following commands, BP with systolics in 90s, did get 250mg  albumin after procedure given persistent hypotension.    Last Vitals:  Filed Vitals:   12/20/15 1330 12/20/15 1340  BP: 87/45 94/59  Pulse: 107 109  Temp:    Resp: 24 28    Last Pain:  Filed Vitals:   12/20/15 1342  PainSc: 7                  Reino KentJudd, Merri Dimaano J

## 2015-12-20 NOTE — Anesthesia Preprocedure Evaluation (Addendum)
Anesthesia Evaluation  Patient identified by MRN, date of birth, ID band Patient awake    Reviewed: Allergy & Precautions, H&P , NPO status , Patient's Chart, lab work & pertinent test results  History of Anesthesia Complications Negative for: history of anesthetic complications  Airway Mallampati: II  TM Distance: >3 FB Neck ROM: full    Dental  (+) Poor Dentition, Dental Advisory Given   Pulmonary Current Smoker,    Pulmonary exam normal breath sounds clear to auscultation       Cardiovascular Normal cardiovascular exam Rhythm:regular Rate:Normal  Echo with preserved EF.. TV vegetations that are causing moderate to severe TR.   Neuro/Psych negative neurological ROS     GI/Hepatic negative GI ROS, (+)     substance abuse  cocaine use and IV drug use, Hepatitis -, C  Endo/Other  negative endocrine ROS  Renal/GU negative Renal ROS     Musculoskeletal   Abdominal   Peds  Hematology negative hematology ROS (+) anemia ,   Anesthesia Other Findings   Reproductive/Obstetrics negative OB ROS                            Anesthesia Physical Anesthesia Plan  ASA: III  Anesthesia Plan: MAC   Post-op Pain Management:    Induction: Intravenous  Airway Management Planned: Nasal Cannula  Additional Equipment:   Intra-op Plan:   Post-operative Plan:   Informed Consent: I have reviewed the patients History and Physical, chart, labs and discussed the procedure including the risks, benefits and alternatives for the proposed anesthesia with the patient or authorized representative who has indicated his/her understanding and acceptance.   Dental Advisory Given  Plan Discussed with: Anesthesiologist, CRNA and Surgeon  Anesthesia Plan Comments: (Plan for MAC with propofol if able. Denies significant reflux, is receiving blood currently, has taken her pain medications today. )        Anesthesia Quick Evaluation
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
## 2015-12-20 NOTE — Progress Notes (Signed)
Pt with temp 102.9 HR 140. Pt states she feels like she has a fever and requesting tylenol. Tylenol given per order. Rekha RN made aware.

## 2015-12-20 NOTE — Clinical Social Work Note (Signed)
CSW AD assisting with SNF placement for 6 weeks IV abx via PICC line.   Patient scheduled to have TEE and 2 units of blood today. CSW remains available as needed.  Derenda FennelBashira Yuleidy Rappleye, MSW, LCSWA [phone] 12/20/2015 9:59 AM

## 2015-12-20 NOTE — CV Procedure (Signed)
    Transesophageal Echocardiogram Note  Consuela MimesLori Kaye Cataldi [id number] 08/25/1991  Procedure: Transesophageal Echocardiogram Indications: bacteremia  Procedure Details Consent: Obtained Time Out: Verified patient identification, verified procedure, site/side was marked, verified correct patient position, special equipment/implants available, Radiology Safety Procedures followed,  medications/allergies/relevent history reviewed, required imaging and test results available.  Performed  Medications:  During this procedure the patient is administered a total of  Propofol 660 mg total.   We required the assistance of anesthesia  Left Ventrical:  Normal LV function   Mitral Valve: normal , no vegetation   Aortic Valve: normal , no vegetation   Tricuspid Valve:  Severe TR.   there is a very large vegetation that has completely destroyed one of the leaflets.  The leaflet is perforated.  Pulmonic Valve: trivial PI,  No vegetation   Left Atrium/ Left atrial appendage: no thrombi  Atrial septum: There is a PFO by color flow.  We also noticed several bubble from the IV traverse from the RA to LA during the course of the procedure.   Aorta: normal    Complications: No apparent complications Patient did tolerate procedure well.   Vesta MixerPhilip J. Zaylon Bossier, Montez HagemanJr., MD, [facility] 12/20/2015, 12:50 PM

## 2015-12-20 NOTE — Consult Note (Signed)
Reason for Consult:Tricuspid valve endocarditis Referring Physician: THRoyanne Foots ID- CAMPBELL, VELERA LANSDALE Arina Torry is an 25 y.o. female.  HPI: 25 yo woman with history of IVDA and hepatitis C. She presents with a cc/o fever, chills, cough and chest pain. She has been feeling poorly for over a month. She had recently completed a stent in inpatient rehab for heroin use. She had been discharged and then started using drugs again shortly thereafter. She intially had general malaise and fatigue. Then subsequently developed fevers and chills, a cough and chest pain. She was admitted to [facility] a couple of weeks ago. She was started on antibiotics for MSSA endocarditis, but left AMA. She returned 4/3 with worsening symptoms.  She is currently on Ancef. She had a TEE today which showed a large vegetation of the tricuspid valve with a mobile vegetation. There also was a PFO. Her pain and cough have improved since admission btu she does still get SOB with minimal activity.   Past Medical History  Diagnosis Date  . Hepatitis C   . Gall bladder disease   . Heroin abuse     Past Surgical History  Procedure Laterality Date  . Cesarean section      Family History  Problem Relation Age of Onset  . Multiple sclerosis Mother   . Hypertension Father   . Diabetes Mellitus I Father     Social History:  reports that she has been smoking Cigarettes.  She has been smoking about 1.00 pack per day. She does not have any smokeless tobacco history on file. She reports that she drinks alcohol. She reports that she uses illicit drugs.  Allergies: No Known Allergies  Medications:  Scheduled: . busPIRone  7.5 mg Oral TID  .  ceFAZolin (ANCEF) IV  2 g Intravenous 3 times per day  . cloNIDine  0.1 mg Oral BID  . enoxaparin (LOVENOX) injection  40 mg Subcutaneous Q24H  . multivitamin with minerals  1 tablet Oral Daily  . naloxegol oxalate  12.5 mg Oral Daily  . nicotine  21 mg Transdermal Daily  .  oxyCODONE  20 mg Oral Q12H  . polyethylene glycol  17 g Oral Daily  . senna  2 tablet Oral QHS  . sodium chloride flush  3 mL Intravenous Q12H  . thiamine  100 mg Oral Daily    Results for orders placed or performed during the hospital encounter of 12/13/15 (from the past 48 hour(s))  Culture, blood (routine x 2)     Status: None (Preliminary result)   Collection Time: 12/18/15  9:10 PM  Result Value Ref Range   Specimen Description BLOOD LEFT ARM    Special Requests IN PEDIATRIC BOTTLE 3CC    Culture NO GROWTH 2 DAYS    Report Status PENDING   Culture, blood (routine x 2)     Status: None (Preliminary result)   Collection Time: 12/18/15  9:15 PM  Result Value Ref Range   Specimen Description BLOOD RIGHT HAND    Special Requests IN PEDIATRIC BOTTLE 3CC    Culture NO GROWTH 2 DAYS    Report Status PENDING   Pregnancy, urine     Status: None   Collection Time: 12/18/15 11:52 PM  Result Value Ref Range   Preg Test, Ur NEGATIVE NEGATIVE    Comment:        THE SENSITIVITY OF THIS METHODOLOGY IS >20 mIU/mL.   CBC     Status: Abnormal   Collection Time: 12/19/15  3:55 AM  Result Value Ref Range   WBC 18.3 (H) 4.0 - 10.5 K/uL   RBC 2.70 (L) 3.87 - 5.11 MIL/uL   Hemoglobin 7.1 (L) 12.0 - 15.0 g/dL   HCT 22.9 (L) 36.0 - 46.0 %   MCV 84.8 78.0 - 100.0 fL   MCH 26.3 26.0 - 34.0 pg   MCHC 31.0 30.0 - 36.0 g/dL   RDW 15.1 11.5 - 15.5 %   Platelets 275 150 - 400 K/uL  Basic metabolic panel     Status: Abnormal   Collection Time: 12/19/15  3:55 AM  Result Value Ref Range   Sodium 134 (L) 135 - 145 mmol/L   Potassium 4.1 3.5 - 5.1 mmol/L    Comment: DELTA CHECK NOTED   Chloride 99 (L) 101 - 111 mmol/L   CO2 24 22 - 32 mmol/L   Glucose, Bld 117 (H) 65 - 99 mg/dL   BUN 10 6 - 20 mg/dL   Creatinine, Ser 0.54 0.44 - 1.00 mg/dL   Calcium 8.1 (L) 8.9 - 10.3 mg/dL   GFR calc non Af Amer >60 >60 mL/min   GFR calc Af Amer >60 >60 mL/min    Comment: (NOTE) The eGFR has been calculated  using the CKD EPI equation. This calculation has not been validated in all clinical situations. eGFR's persistently <60 mL/min signify possible Chronic Kidney Disease.    Anion gap 11 5 - 15  CBC     Status: Abnormal   Collection Time: 12/20/15  3:29 AM  Result Value Ref Range   WBC 19.5 (H) 4.0 - 10.5 K/uL   RBC 2.57 (L) 3.87 - 5.11 MIL/uL   Hemoglobin 6.9 (LL) 12.0 - 15.0 g/dL    Comment: CRITICAL RESULT CALLED TO, READ BACK BY AND VERIFIED WITH: TO FJONES(RN) BY TCLEVELAND 12/20/2015 AT 4:34AM REPEATED TO VERIFY    HCT 21.6 (L) 36.0 - 46.0 %   MCV 84.0 78.0 - 100.0 fL   MCH 26.8 26.0 - 34.0 pg   MCHC 31.9 30.0 - 36.0 g/dL   RDW 15.3 11.5 - 15.5 %   Platelets 343 150 - 400 K/uL  ABO/Rh     Status: None   Collection Time: 12/20/15  5:17 AM  Result Value Ref Range   ABO/RH(D) O POS   Prepare RBC     Status: None   Collection Time: 12/20/15  5:17 AM  Result Value Ref Range   Order Confirmation ORDER PROCESSED BY BLOOD BANK   Type and screen [place]     Status: None (Preliminary result)   Collection Time: 12/20/15  5:20 AM  Result Value Ref Range   ABO/RH(D) O POS    Antibody Screen NEG    Sample Expiration 12/23/2015    Unit Number [id number]    Blood Component Type RBC LR PHER2    Unit division 00    Status of Unit ISSUED    Transfusion Status OK TO TRANSFUSE    Crossmatch Result Compatible    Unit Number [id number]    Blood Component Type RED CELLS,LR    Unit division 00    Status of Unit ISSUED    Transfusion Status OK TO TRANSFUSE    Crossmatch Result Compatible   Hemoglobin and hematocrit, blood     Status: Abnormal   Collection Time: 12/20/15  4:09 PM  Result Value Ref Range   Hemoglobin 9.8 (L) 12.0 - 15.0 g/dL    Comment: REPEATED TO VERIFY DELTA CHECK NOTED  POST TRANSFUSION SPECIMEN    HCT 29.8 (L) 36.0 - 46.0 %    Ct Abdomen Pelvis W Contrast  12/19/2015  CLINICAL DATA:  25 year old with current history of intravenous drug  use, staphylococcal bacteremia with sepsis, bacterial endocarditis and septic emboli to the lungs. Current history of hepatitis-C. Acute onset of right upper quadrant abdominal pain. EXAM: CT ABDOMEN AND PELVIS WITH CONTRAST TECHNIQUE: Multidetector CT imaging of the abdomen and pelvis was performed using the standard protocol following bolus administration of intravenous contrast. CONTRAST:  128m ISOVUE-300 IOPAMIDOL INJECTION 61% IV. Oral contrast was also administered. COMPARISON:  06/29/2015, 01/03/2015 and earlier. FINDINGS: Lower chest: Numerous nodular opacities and peripheral airspace opacities with cavitation in the visualized lung bases indicative of septic emboli. Small bilateral pleural effusions, right greater than left. Heart mildly enlarged, having increased in size since October, 2016. Hepatobiliary: Hepatomegaly. No focal hepatic parenchymal abnormality. Anatomic variant in that the left lobe of liver extends well across the midline into the left upper quadrant. Pancreas: Normal in appearance without evidence of mass, ductal dilation, or inflammation. Spleen: Mild splenomegaly, the spleen measuring approximately 10.9 x 6.7 x 13.9 cm yielding a volume of approximately 508 ml. No focal parenchymal abnormality. Adrenals/Urinary Tract: Normal appearing adrenal glands. Kidneys normal in size and appearance without focal parenchymal abnormality. No evidence of urinary tract calculi or obstruction. Normal-appearing urinary bladder. Stomach/Bowel: Stomach normal in appearance for the degree of distention. Normal-appearing small bowel. Large stool burden throughout normal appearing colon. Cecum extends low in the right side of the pelvis. Appendix not clearly visualized, but no pericecal inflammation. Vascular/Lymphatic: No visible aortoiliofemoral atherosclerosis. Widely patent visceral arteries. Normal-appearing portal venous and systemic venous systems. No pathologic lymphadenopathy. Reproductive:  Normal-appearing uterus and ovaries without evidence of adnexal mass. Other: None. Musculoskeletal: No acute or significant abnormality. IMPRESSION: 1. No acute abnormalities involving the abdomen or pelvis. 2. Hepatosplenomegaly. No focal parenchymal abnormality involving the liver or spleen. 3. Large colonic stool burden. 4. Septic emboli with nodular and confluent airspace opacities in the visualized lung bases, many of which demonstrate cavitation. 5. Mild cardiomegaly, with interval increase in heart size when compared to the prior CT from October, 2016. Electronically Signed   By: TEvangeline DakinM.D.   On: 12/19/2015 07:44   Dg Chest Port 1 View  12/19/2015  CLINICAL DATA:  Shortness of breath. EXAM: PORTABLE CHEST 1 VIEW COMPARISON:  12/17/2015 FINDINGS: Again noted are patchy parenchymal lung densities, right side greater than left. Slightly more confluent densities at the left lung base. Heart size is stable and within normal limits. The trachea is midline. IMPRESSION: Persistent bilateral parenchymal lung densities are suspicious for infection. Slightly increased densities at the left lung base. Electronically Signed   By: AMarkus DaftM.D.   On: 12/19/2015 08:54    Review of Systems  Constitutional: Positive for fever, chills and malaise/fatigue.  HENT: Negative for hearing loss.   Respiratory: Positive for cough and shortness of breath.   Cardiovascular: Positive for chest pain and leg swelling.  Gastrointestinal: Negative for blood in stool.  Genitourinary: Negative for dysuria and hematuria.  Musculoskeletal: Positive for myalgias and joint pain.  Neurological: Negative for seizures, loss of consciousness and headaches.   Blood pressure 103/53, pulse 117, temperature 97.7 F (36.5 C), temperature source Oral, resp. rate 20, height 5' 7"  (1.702 m), weight 111 lb 9.6 oz (50.621 kg), SpO2 100 %. Physical Exam  Vitals reviewed. Constitutional: She is oriented to person, place, and time.  She appears  well-developed. No distress.  cachectic  HENT:  Head: Normocephalic.  Multiple abrasions, lacerations, and ecchymoses around face  Eyes: Conjunctivae and EOM are normal. No scleral icterus.  Neck: Neck supple. JVD present. No thyromegaly present.  Cardiovascular: Regular rhythm and intact distal pulses.   Murmur heard. tachycardic  Respiratory: Effort normal and breath sounds normal. She has no wheezes. She has no rales.  GI: Bowel sounds are normal. She exhibits no distension. There is no tenderness.  Musculoskeletal: She exhibits edema (trace).  Lymphadenopathy:    She has no cervical adenopathy.  Neurological: She is alert and oriented to person, place, and time. No cranial nerve deficit.  No focal motor deficit  Skin: Skin is warm and dry.   TRANSESOPHAGEAL ECHOCARDIOGRAM Left Ventrical: Normal LV function   Mitral Valve: normal , no vegetation   Aortic Valve: normal , no vegetation   Tricuspid Valve: Severe TR. there is a very large vegetation that has completely destroyed one of the leaflets. The leaflet is perforated.  Pulmonic Valve: trivial PI, No vegetation   Left Atrium/ Left atrial appendage: no thrombi  Atrial septum: There is a PFO by color flow. We also noticed several bubble from the IV traverse from the RA to LA during the course of the procedure.   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 She has severe TR with a large vegetation that has essentially destroyed one leaflet of the valve. She also was noted to have a PFO. She is at risk for embolization either to the lungs(and already has evidence of septic pulmonary emboli) or systemically via the PFO.   Tricuspid repair or replacement is indicated secondary to embolic risk. She is also likely to develop worsening right heart failure due to the severity of her MR.  She is a difficult patient due to her history of drug abuse and  non-compliance.  We will assess the valve for possible repair but in all likelihood she will need a replacement. She is not a candidate for coumadin so the only option may be tissue valve replacement. If so, she will likely need redo procedures in the future.  I discussed the general nature of the procedure, the need for general anesthesia, the use of cardiopulmonary bypass, and the incision (sternotomy) to be used with Mrs. Demartini. We discussed the expected hospital stay, overall recovery and short and long term outcomes. She understands the high risk of reinfection if she continues to use drugs. She understands the risks include, but are not limited to death, stroke, MI, DVT/PE, bleeding, possible need for transfusion, infections, cardiac arrhythmias, complete heart block necessitating a pacemaker, as well as other organ system dysfunction including respiratory, renal, or GI complications. She accepts the risks and agrees to proceed.  Plan TVR later this week.  Melrose Nakayama 12/20/2015, 5:32 PM

## 2015-12-20 NOTE — Anesthesia Procedure Notes (Signed)
Procedure Name: MAC Date/Time: 12/20/2015 12:25 PM Performed by: Faustino CongressWHITE, Mccormick Macon TENA Huntley Demedeiros Pre-anesthesia Checklist: Patient identified, Emergency Drugs available, Suction available and Patient being monitored Patient Re-evaluated:Patient Re-evaluated prior to inductionOxygen Delivery Method: Nasal cannula

## 2015-12-20 NOTE — Progress Notes (Signed)
Patient ID: Madeline Andrade, female   DOB: 08/06/1991, 25 y.o.   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 She was recently hospitalized from 11/29/2015 to 12/04/2015 blood left AMA. She was readmitted on 12/12/2015 and blood cultures were again positive. Her most recent blood cultures have been negative. It appears she is beginning to defervesce. Today's TTE revealed her known, large tricuspid valve vegetation and showed one perforated and "destroyed" leaflet. It also revealed a patent foramen ovale. She was out of her room this morning for her TEE was not able to examine her. I will continue cefazolin and follow-up tomorrow. I would recommend formal cardiac surgery evaluation given her very large tricuspid valve vegetation putting her at risk for valvular insufficiency and embolization, both pulmonary and systemic given her patent foramen ovale.         Cliffton AstersJohn Boruch Manuele, MD [facility] [phone] pager   [phone] cell 09/14/2015, 1:32 PM

## 2015-12-21 ENCOUNTER — Other Ambulatory Visit: Payer: Self-pay | Admitting: *Deleted

## 2015-12-21 DIAGNOSIS — I330 Acute and subacute infective endocarditis: Secondary | ICD-10-CM

## 2015-12-21 DIAGNOSIS — Z2252 Carrier of viral hepatitis C: Secondary | ICD-10-CM

## 2015-12-21 DIAGNOSIS — R071 Chest pain on breathing: Secondary | ICD-10-CM

## 2015-12-21 DIAGNOSIS — I071 Rheumatic tricuspid insufficiency: Secondary | ICD-10-CM

## 2015-12-21 DIAGNOSIS — B9561 Methicillin susceptible Staphylococcus aureus infection as the cause of diseases classified elsewhere: Secondary | ICD-10-CM

## 2015-12-21 DIAGNOSIS — I2690 Septic pulmonary embolism without acute cor pulmonale: Secondary | ICD-10-CM

## 2015-12-21 LAB — URINALYSIS, ROUTINE W REFLEX MICROSCOPIC
BILIRUBIN URINE: NEGATIVE
Glucose, UA: NEGATIVE mg/dL
HGB URINE DIPSTICK: NEGATIVE
KETONES UR: NEGATIVE mg/dL
Leukocytes, UA: NEGATIVE
Nitrite: NEGATIVE
PROTEIN: NEGATIVE mg/dL
Specific Gravity, Urine: 1.01 (ref 1.005–1.030)
pH: 7 (ref 5.0–8.0)

## 2015-12-21 LAB — PROTIME-INR
INR: 1.23 (ref 0.00–1.49)
PROTHROMBIN TIME: 15.7 s — AB (ref 11.6–15.2)

## 2015-12-21 LAB — BLOOD GAS, ARTERIAL
Acid-Base Excess: 4.3 mmol/L — ABNORMAL HIGH (ref 0.0–2.0)
BICARBONATE: 27.9 meq/L — AB (ref 20.0–24.0)
Drawn by: 44898
FIO2: 0.21
O2 CONTENT: 0 L/min
O2 SAT: 91.5 %
PH ART: 7.467 — AB (ref 7.350–7.450)
Patient temperature: 98.6
TCO2: 29.1 mmol/L (ref 0–100)
pCO2 arterial: 39.1 mmHg (ref 35.0–45.0)
pO2, Arterial: 60.1 mmHg — ABNORMAL LOW (ref 80.0–100.0)

## 2015-12-21 LAB — SURGICAL PCR SCREEN
MRSA, PCR: NEGATIVE
STAPHYLOCOCCUS AUREUS: NEGATIVE

## 2015-12-21 LAB — ABO/RH: ABO/RH(D): O POS

## 2015-12-21 MED ORDER — NOREPINEPHRINE BITARTRATE 1 MG/ML IV SOLN
0.0000 ug/min | INTRAVENOUS | Status: DC
  Filled 2015-12-21: qty 4

## 2015-12-21 MED ORDER — DIAZEPAM 5 MG PO TABS
5.0000 mg | ORAL_TABLET | Freq: Once | ORAL | Status: AC
  Administered 2015-12-22: 5 mg via ORAL
  Filled 2015-12-21: qty 1

## 2015-12-21 MED ORDER — POTASSIUM CHLORIDE 2 MEQ/ML IV SOLN
80.0000 meq | INTRAVENOUS | Status: DC
  Filled 2015-12-21: qty 40

## 2015-12-21 MED ORDER — DEXTROSE 5 % IV SOLN
1.5000 g | INTRAVENOUS | Status: AC
  Administered 2015-12-22: .75 g via INTRAVENOUS
  Administered 2015-12-22: 1.5 g via INTRAVENOUS
  Filled 2015-12-21: qty 1.5

## 2015-12-21 MED ORDER — CHLORHEXIDINE GLUCONATE 0.12 % MT SOLN
15.0000 mL | Freq: Once | OROMUCOSAL | Status: AC
  Administered 2015-12-22: 15 mL via OROMUCOSAL
  Filled 2015-12-21: qty 15

## 2015-12-21 MED ORDER — DOPAMINE-DEXTROSE 3.2-5 MG/ML-% IV SOLN
0.0000 ug/kg/min | INTRAVENOUS | Status: DC
  Filled 2015-12-21: qty 250

## 2015-12-21 MED ORDER — MAGNESIUM SULFATE 50 % IJ SOLN
40.0000 meq | INTRAMUSCULAR | Status: DC
  Filled 2015-12-21: qty 10

## 2015-12-21 MED ORDER — SODIUM CHLORIDE 0.9 % IV SOLN
INTRAVENOUS | Status: DC
  Filled 2015-12-21: qty 30

## 2015-12-21 MED ORDER — CHLORHEXIDINE GLUCONATE 4 % EX LIQD
60.0000 mL | Freq: Once | CUTANEOUS | Status: AC
  Administered 2015-12-21: 4 via TOPICAL
  Filled 2015-12-21: qty 60

## 2015-12-21 MED ORDER — SODIUM CHLORIDE 0.9 % IV SOLN
INTRAVENOUS | Status: DC
  Filled 2015-12-21: qty 2.5

## 2015-12-21 MED ORDER — VANCOMYCIN HCL 10 G IV SOLR
1250.0000 mg | INTRAVENOUS | Status: AC
  Administered 2015-12-22: 1250 mg via INTRAVENOUS
  Filled 2015-12-21: qty 1250

## 2015-12-21 MED ORDER — DEXTROSE 5 % IV SOLN
30.0000 ug/min | INTRAVENOUS | Status: DC
  Filled 2015-12-21: qty 2

## 2015-12-21 MED ORDER — METOPROLOL TARTRATE 12.5 MG HALF TABLET
12.5000 mg | ORAL_TABLET | Freq: Once | ORAL | Status: AC
  Administered 2015-12-22: 12.5 mg via ORAL
  Filled 2015-12-21: qty 1

## 2015-12-21 MED ORDER — NITROGLYCERIN IN D5W 200-5 MCG/ML-% IV SOLN
2.0000 ug/min | INTRAVENOUS | Status: DC
  Filled 2015-12-21: qty 250

## 2015-12-21 MED ORDER — TEMAZEPAM 15 MG PO CAPS
15.0000 mg | ORAL_CAPSULE | Freq: Once | ORAL | Status: DC | PRN
  Filled 2015-12-21: qty 1

## 2015-12-21 MED ORDER — BISACODYL 5 MG PO TBEC: 5.0000 mg | DELAYED_RELEASE_TABLET | Freq: Once | ORAL | Status: DC

## 2015-12-21 MED ORDER — EPINEPHRINE HCL 1 MG/ML IJ SOLN
0.0000 ug/min | INTRAMUSCULAR | Status: DC
  Filled 2015-12-21: qty 4

## 2015-12-21 MED ORDER — DEXMEDETOMIDINE HCL IN NACL 400 MCG/100ML IV SOLN
0.1000 ug/kg/h | INTRAVENOUS | Status: DC
  Filled 2015-12-21: qty 100

## 2015-12-21 MED ORDER — CHLORHEXIDINE GLUCONATE 4 % EX LIQD
60.0000 mL | Freq: Once | CUTANEOUS | Status: AC
  Administered 2015-12-22: 4 via TOPICAL
  Filled 2015-12-21: qty 60
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
## 2015-12-21 NOTE — Progress Notes (Signed)
1 Day Post-Op Procedure(s) (LRB): TRANSESOPHAGEAL ECHOCARDIOGRAM (TEE) (N/A) Subjective: Feels better today but very anxious about surgery  Objective: Vital signs in last 24 hours: Temp:  [98.8 F (37.1 C)-102.9 F (39.4 C)] 99.6 F (37.6 C) (04/11 1423) Pulse Rate:  [115-141] 115 (04/11 1423) Cardiac Rhythm:  [-] Sinus tachycardia (04/11 0700) Resp:  [18-20] 20 (04/11 1423) BP: (107-114)/(48-64) 114/64 mmHg (04/11 1423) SpO2:  [98 %-100 %] 100 % (04/11 1423) Weight:  [113 lb (51.256 kg)] 113 lb (51.256 kg) (04/11 0659)  Hemodynamic parameters for last 24 hours:    Intake/Output from previous day: 04/10 0701 - 04/11 0700 In: 1524 [I.V.:700; Blood:724; IV Piggyback:100] Out: 1400 [Urine:1400] Intake/Output this shift: Total I/O In: 723 [P.O.:720; I.V.:3] Out: 400 [Urine:400]  General appearance: alert, cooperative and no distress Neurologic: intact Heart: regular rate and rhythm and systolic murmur Lungs: clear to auscultation bilaterally  Lab Results:  Recent Labs  12/19/15 0355 12/20/15 0329 12/20/15 1609  WBC 18.3* 19.5*  --   HGB 7.1* 6.9* 9.8*  HCT 22.9* 21.6* 29.8*  PLT 275 343  --    BMET:  Recent Labs  12/19/15 0355  NA 134*  K 4.1  CL 99*  CO2 24  GLUCOSE 117*  BUN 10  CREATININE 0.54  CALCIUM 8.1*    PT/INR: No results for input(s): LABPROT, INR in the last 72 hours. ABG    Component Value Date/Time   PHART 7.508* 12/02/2015 0403   HCO3 22.5 12/02/2015 0403   TCO2 21.0 12/02/2015 0403   ACIDBASEDEF 0.8 12/01/2015 0738   O2SAT 95.1 12/02/2015 0403   CBG (last 3)  No results for input(s): GLUCAP in the last 72 hours.  Assessment/Plan: S/P Procedure(s) (LRB): TRANSESOPHAGEAL ECHOCARDIOGRAM (TEE) (N/A) -  Staph TV endocarditis. She has a large mobile vegetation with a destroyed leaflet and severe TR in the setting of a PFO. She has already had septic pulmonary emboli and is at risk for systemic embolization as well due to PFO. She also  is likely to develop RHF if untreated.  I recommended she undergo TV repair or replacement (replacement more likely). She understands the indications, risks and benefits and wishes to proceed.  Will go ahead with surgery tomorrow afternoon.  She is still having fevers but blood cultures are negative and fevers may linger for a long time.  She is aware of the consequences of continued drug abuse. She seems committed to staying clean but her risk of relapse is high.  Plan OR tomorrow afternoon   LOS: 8 days    Loreli SlotSteven C Jevan Gaunt 12/21/2015

## 2015-12-21 NOTE — Progress Notes (Signed)
CARDIAC REHAB PHASE I   Pt in bed, states she has been walking without difficulty, declines additional ambulation at this time. Pre-op education completed with pt at bedside. Pt very emotional, crying at times, asked her husband to leave, HR elevated to 120s. Emotional support given to pt. Reviewed IS, sternal precautions, activity progression, cardiac surgery booklet and cardiac surgery guidelines. Left instructions to view cardiac surgery videos. Pt verbalized understanding, receptive to education. Pt states she is afraid and worried about being in pain post-op. Pt in bed, call bell within reach. Will follow post-op.   [id number] Joylene GrapesEmily C Adaleena Mooers, RN, BSN 12/21/2015 3:14 PM

## 2015-12-22 ENCOUNTER — Other Ambulatory Visit (HOSPITAL_COMMUNITY): Payer: Self-pay

## 2015-12-22 ENCOUNTER — Inpatient Hospital Stay (HOSPITAL_COMMUNITY): Payer: Self-pay

## 2015-12-22 ENCOUNTER — Encounter (HOSPITAL_COMMUNITY)
Admission: AD | Disposition: A | Discharge status: Skilled Nursing Facility | Payer: Self-pay | Source: Other Acute Inpatient Hospital | Attending: Thoracic Surgery (Cardiothoracic Vascular Surgery)

## 2015-12-22 ENCOUNTER — Encounter (HOSPITAL_COMMUNITY): Payer: Self-pay | Admitting: Certified Registered"

## 2015-12-22 ENCOUNTER — Inpatient Hospital Stay (HOSPITAL_COMMUNITY): Payer: Self-pay | Admitting: Anesthesiology

## 2015-12-22 DIAGNOSIS — Q211 Atrial septal defect: Secondary | ICD-10-CM

## 2015-12-22 DIAGNOSIS — I38 Endocarditis, valve unspecified: Secondary | ICD-10-CM | POA: Diagnosis present

## 2015-12-22 DIAGNOSIS — I361 Nonrheumatic tricuspid (valve) insufficiency: Secondary | ICD-10-CM

## 2015-12-22 HISTORY — PX: TEE WITHOUT CARDIOVERSION: SHX5443

## 2015-12-22 HISTORY — PX: TRICUSPID VALVE REPLACEMENT: SHX816

## 2015-12-22 LAB — CBC
HCT: 23.7 % — ABNORMAL LOW (ref 36.0–46.0)
HEMATOCRIT: 33.3 % — AB (ref 36.0–46.0)
Hemoglobin: 7.9 g/dL — ABNORMAL LOW (ref 12.0–15.0)
Hemoglobin: 10.6 g/dL — ABNORMAL LOW (ref 12.0–15.0)
MCH: 28.9 pg (ref 26.0–34.0)
MCH: 27.1 pg (ref 26.0–34.0)
MCHC: 33.3 g/dL (ref 30.0–36.0)
MCHC: 31.8 g/dL (ref 30.0–36.0)
MCV: 86.8 fL (ref 78.0–100.0)
MCV: 85.2 fL (ref 78.0–100.0)
PLATELETS: 279 10*3/uL (ref 150–400)
Platelets: 211 10*3/uL (ref 150–400)
RBC: 2.73 MIL/uL — AB (ref 3.87–5.11)
RBC: 3.91 MIL/uL (ref 3.87–5.11)
RDW: 14.7 % (ref 11.5–15.5)
RDW: 15.5 % (ref 11.5–15.5)
WBC: 11.1 10*3/uL — AB (ref 4.0–10.5)
WBC: 36.1 10*3/uL — ABNORMAL HIGH (ref 4.0–10.5)

## 2015-12-22 LAB — PROTIME-INR
INR: 1.49 (ref 0.00–1.49)
Prothrombin Time: 18.1 seconds — ABNORMAL HIGH (ref 11.6–15.2)

## 2015-12-22 LAB — POCT I-STAT, CHEM 8
BUN: 9 mg/dL (ref 6–20)
BUN: 9 mg/dL (ref 6–20)
BUN: 8 mg/dL (ref 6–20)
BUN: 9 mg/dL (ref 6–20)
BUN: 9 mg/dL (ref 6–20)
CALCIUM ION: 1.19 mmol/L (ref 1.12–1.23)
CHLORIDE: 96 mmol/L — AB (ref 101–111)
CHLORIDE: 96 mmol/L — AB (ref 101–111)
CHLORIDE: 97 mmol/L — AB (ref 101–111)
CREATININE: 0.2 mg/dL — AB (ref 0.44–1.00)
Calcium, Ion: 1.18 mmol/L (ref 1.12–1.23)
Calcium, Ion: 1.01 mmol/L — ABNORMAL LOW (ref 1.12–1.23)
Calcium, Ion: 1.05 mmol/L — ABNORMAL LOW (ref 1.12–1.23)
Calcium, Ion: 1.07 mmol/L — ABNORMAL LOW (ref 1.12–1.23)
Chloride: 96 mmol/L — ABNORMAL LOW (ref 101–111)
Chloride: 92 mmol/L — ABNORMAL LOW (ref 101–111)
Creatinine, Ser: <0.2 mg/dL — ABNORMAL LOW (ref 0.44–1.00)
Creatinine, Ser: <0.2 mg/dL — ABNORMAL LOW (ref 0.44–1.00)
Creatinine, Ser: <0.2 mg/dL — ABNORMAL LOW (ref 0.44–1.00)
GLUCOSE: 206 mg/dL — AB (ref 65–99)
Glucose, Bld: 84 mg/dL (ref 65–99)
Glucose, Bld: 87 mg/dL (ref 65–99)
Glucose, Bld: 85 mg/dL (ref 65–99)
Glucose, Bld: 166 mg/dL — ABNORMAL HIGH (ref 65–99)
HCT: 25 % — ABNORMAL LOW (ref 36.0–46.0)
HEMATOCRIT: 25 % — AB (ref 36.0–46.0)
HEMATOCRIT: 27 % — AB (ref 36.0–46.0)
HEMATOCRIT: 23 % — AB (ref 36.0–46.0)
HEMATOCRIT: 24 % — AB (ref 36.0–46.0)
HEMOGLOBIN: 8.5 g/dL — AB (ref 12.0–15.0)
HEMOGLOBIN: 7.8 g/dL — AB (ref 12.0–15.0)
HEMOGLOBIN: 8.2 g/dL — AB (ref 12.0–15.0)
Hemoglobin: 9.2 g/dL — ABNORMAL LOW (ref 12.0–15.0)
Hemoglobin: 8.5 g/dL — ABNORMAL LOW (ref 12.0–15.0)
POTASSIUM: 3.5 mmol/L (ref 3.5–5.1)
POTASSIUM: 4.6 mmol/L (ref 3.5–5.1)
POTASSIUM: 5.2 mmol/L — AB (ref 3.5–5.1)
Potassium: 4 mmol/L (ref 3.5–5.1)
Potassium: 4.3 mmol/L (ref 3.5–5.1)
SODIUM: 135 mmol/L (ref 135–145)
SODIUM: 135 mmol/L (ref 135–145)
Sodium: 135 mmol/L (ref 135–145)
Sodium: 133 mmol/L — ABNORMAL LOW (ref 135–145)
Sodium: 134 mmol/L — ABNORMAL LOW (ref 135–145)
TCO2: 31 mmol/L (ref 0–100)
TCO2: 32 mmol/L (ref 0–100)
TCO2: 28 mmol/L (ref 0–100)
TCO2: 27 mmol/L (ref 0–100)
TCO2: 28 mmol/L (ref 0–100)

## 2015-12-22 LAB — PREPARE RBC (CROSSMATCH)

## 2015-12-22 LAB — PLATELET COUNT: PLATELETS: 255 10*3/uL (ref 150–400)

## 2015-12-22 LAB — COMPREHENSIVE METABOLIC PANEL
ALBUMIN: 1.4 g/dL — AB (ref 3.5–5.0)
ALT: 42 U/L (ref 14–54)
AST: 71 U/L — AB (ref 15–41)
Alkaline Phosphatase: 121 U/L (ref 38–126)
Anion gap: 10 (ref 5–15)
BUN: 8 mg/dL (ref 6–20)
CHLORIDE: 102 mmol/L (ref 101–111)
CO2: 27 mmol/L (ref 22–32)
CREATININE: 0.34 mg/dL — AB (ref 0.44–1.00)
Calcium: 8.1 mg/dL — ABNORMAL LOW (ref 8.9–10.3)
GFR calc Af Amer: >60 mL/min (ref >60)
GFR calc non Af Amer: >60 mL/min (ref >60)
GLUCOSE: 77 mg/dL (ref 65–99)
POTASSIUM: 4.1 mmol/L (ref 3.5–5.1)
SODIUM: 139 mmol/L (ref 135–145)
Total Bilirubin: 0.7 mg/dL (ref 0.3–1.2)
Total Protein: 6 g/dL — ABNORMAL LOW (ref 6.5–8.1)

## 2015-12-22 LAB — CULTURE, BLOOD (ROUTINE X 2)
CULTURE: NO GROWTH
Culture: NO GROWTH

## 2015-12-22 LAB — POCT I-STAT 3, ART BLOOD GAS (G3+)
Acid-Base Excess: 6 mmol/L — ABNORMAL HIGH (ref 0.0–2.0)
BICARBONATE: 30.2 meq/L — AB (ref 20.0–24.0)
BICARBONATE: 26.4 meq/L — AB (ref 20.0–24.0)
O2 SAT: 99 %
O2 Saturation: 100 %
PCO2 ART: 43.4 mmHg (ref 35.0–45.0)
PCO2 ART: 48.6 mmHg — AB (ref 35.0–45.0)
PH ART: 7.45 (ref 7.350–7.450)
PO2 ART: 250 mmHg — AB (ref 80.0–100.0)
PO2 ART: 141 mmHg — AB (ref 80.0–100.0)
Patient temperature: 37.7
TCO2: 32 mmol/L (ref 0–100)
TCO2: 28 mmol/L (ref 0–100)
pH, Arterial: 7.346 — ABNORMAL LOW (ref 7.350–7.450)

## 2015-12-22 LAB — HEMOGLOBIN AND HEMATOCRIT, BLOOD
HCT: 19.6 % — ABNORMAL LOW (ref 36.0–46.0)
Hemoglobin: 6.4 g/dL — CL (ref 12.0–15.0)

## 2015-12-22 LAB — APTT
aPTT: 32 seconds (ref 24–37)
aPTT: 32 seconds (ref 24–37)

## 2015-12-22 SURGERY — TRICUSPID VALVE REPAIR
Anesthesia: General | Site: Esophagus

## 2015-12-22 MED ORDER — SODIUM CHLORIDE 0.9 % IV SOLN
250.0000 [IU] | INTRAVENOUS | Status: DC | PRN
  Administered 2015-12-22: 2.1 [IU]/h via INTRAVENOUS

## 2015-12-22 MED ORDER — TRAMADOL HCL 50 MG PO TABS: 50.0000 mg | ORAL_TABLET | ORAL | Status: DC | PRN

## 2015-12-22 MED ORDER — LACTATED RINGERS IV SOLN
INTRAVENOUS | Status: DC
  Administered 2015-12-22: 23:00:00 via INTRAVENOUS

## 2015-12-22 MED ORDER — NITROGLYCERIN IN D5W 200-5 MCG/ML-% IV SOLN: 0.0000 ug/min | INTRAVENOUS | Status: DC

## 2015-12-22 MED ORDER — SODIUM CHLORIDE 0.9 % IV SOLN: 250.0000 mL | INTRAVENOUS | Status: DC

## 2015-12-22 MED ORDER — ROCURONIUM BROMIDE 50 MG/5ML IV SOLN
INTRAVENOUS | Status: AC
  Filled 2015-12-22: qty 1

## 2015-12-22 MED ORDER — FAMOTIDINE IN NACL 20-0.9 MG/50ML-% IV SOLN
20.0000 mg | Freq: Two times a day (BID) | INTRAVENOUS | Status: AC
  Administered 2015-12-23 (×2): 20 mg via INTRAVENOUS
  Filled 2015-12-22 (×2): qty 50

## 2015-12-22 MED ORDER — MIDAZOLAM HCL 5 MG/5ML IJ SOLN
INTRAMUSCULAR | Status: DC | PRN
  Administered 2015-12-22: 4 mg via INTRAVENOUS
  Administered 2015-12-22: 2 mg via INTRAVENOUS
  Administered 2015-12-22: 4 mg via INTRAVENOUS
  Administered 2015-12-22: 6 mg via INTRAVENOUS
  Administered 2015-12-22: 2 mg via INTRAVENOUS

## 2015-12-22 MED ORDER — SODIUM CHLORIDE 0.9 % IV SOLN
INTRAVENOUS | Status: DC
  Filled 2015-12-22: qty 40

## 2015-12-22 MED ORDER — ACETAMINOPHEN 160 MG/5ML PO SOLN: 650.0000 mg | Freq: Once | ORAL | Status: AC

## 2015-12-22 MED ORDER — ONDANSETRON HCL 4 MG/2ML IJ SOLN
4.0000 mg | Freq: Four times a day (QID) | INTRAMUSCULAR | Status: DC | PRN
Start: 2015-12-22 — End: 2015-12-25
  Administered 2015-12-22: 4 mg via INTRAVENOUS
  Filled 2015-12-22: qty 2

## 2015-12-22 MED ORDER — DEXMEDETOMIDINE HCL IN NACL 200 MCG/50ML IV SOLN
INTRAVENOUS | Status: DC | PRN
  Administered 2015-12-22: 0.4 ug/kg/h via INTRAVENOUS

## 2015-12-22 MED ORDER — PANTOPRAZOLE SODIUM 40 MG PO TBEC
40.0000 mg | DELAYED_RELEASE_TABLET | Freq: Every day | ORAL | Status: DC
  Administered 2015-12-24 – 2015-12-25 (×2): 40 mg via ORAL
  Filled 2015-12-22 (×2): qty 1

## 2015-12-22 MED ORDER — LACTATED RINGERS IV SOLN
INTRAVENOUS | Status: DC | PRN
Start: 2015-12-22 — End: 2015-12-22
  Administered 2015-12-22: 16:00:00 via INTRAVENOUS

## 2015-12-22 MED ORDER — SODIUM CHLORIDE 0.45 % IV SOLN: INTRAVENOUS | Status: DC | PRN

## 2015-12-22 MED ORDER — LACTATED RINGERS IV SOLN
500.0000 mL | Freq: Once | INTRAVENOUS | Status: DC | PRN
Start: 2015-12-22 — End: 2015-12-25

## 2015-12-22 MED ORDER — LACTATED RINGERS IV SOLN
INTRAVENOUS | Status: DC | PRN
  Administered 2015-12-22: 14:00:00 via INTRAVENOUS

## 2015-12-22 MED ORDER — PROPOFOL 10 MG/ML IV BOLUS
INTRAVENOUS | Status: AC
  Filled 2015-12-22: qty 20

## 2015-12-22 MED ORDER — METOPROLOL TARTRATE 12.5 MG HALF TABLET: 12.5000 mg | ORAL_TABLET | Freq: Two times a day (BID) | ORAL | Status: DC

## 2015-12-22 MED ORDER — MIDAZOLAM HCL 10 MG/2ML IJ SOLN
INTRAMUSCULAR | Status: AC
  Filled 2015-12-22: qty 2

## 2015-12-22 MED ORDER — ARTIFICIAL TEARS OP OINT
TOPICAL_OINTMENT | OPHTHALMIC | Status: DC | PRN
  Administered 2015-12-22: 1 via OPHTHALMIC

## 2015-12-22 MED ORDER — SODIUM CHLORIDE 0.9 % IV SOLN
Freq: Once | INTRAVENOUS | Status: AC
  Administered 2015-12-22: 19:00:00 via INTRAVENOUS

## 2015-12-22 MED ORDER — VECURONIUM BROMIDE 10 MG IV SOLR
INTRAVENOUS | Status: DC | PRN
  Administered 2015-12-22: 10 mg via INTRAVENOUS
  Administered 2015-12-22 (×2): 2 mg via INTRAVENOUS
  Administered 2015-12-22 (×2): 3 mg via INTRAVENOUS

## 2015-12-22 MED ORDER — METOPROLOL TARTRATE 1 MG/ML IV SOLN: 2.5000 mg | INTRAVENOUS | Status: DC | PRN

## 2015-12-22 MED ORDER — OXYCODONE HCL 5 MG PO TABS
5.0000 mg | ORAL_TABLET | ORAL | Status: DC | PRN
  Administered 2015-12-23 – 2015-12-24 (×3): 10 mg via ORAL
  Administered 2015-12-25 (×2): 5 mg via ORAL
  Filled 2015-12-22: qty 2
  Filled 2015-12-22 (×2): qty 1
  Filled 2015-12-22 (×2): qty 2

## 2015-12-22 MED ORDER — SODIUM CHLORIDE 0.9 % IV SOLN
10.0000 g | INTRAVENOUS | Status: DC | PRN
  Administered 2015-12-22: 5 g/h via INTRAVENOUS

## 2015-12-22 MED ORDER — ACETAMINOPHEN 160 MG/5ML PO SOLN
1000.0000 mg | Freq: Four times a day (QID) | ORAL | Status: DC
  Administered 2015-12-23: 1000 mg
  Filled 2015-12-22: qty 40.6

## 2015-12-22 MED ORDER — ACETAMINOPHEN 650 MG RE SUPP
650.0000 mg | Freq: Once | RECTAL | Status: AC
  Administered 2015-12-22: 650 mg via RECTAL

## 2015-12-22 MED ORDER — MIDAZOLAM HCL 2 MG/2ML IJ SOLN
2.0000 mg | INTRAMUSCULAR | Status: DC | PRN
  Administered 2015-12-22: 2 mg via INTRAVENOUS
  Filled 2015-12-22: qty 2

## 2015-12-22 MED ORDER — SUFENTANIL CITRATE 50 MCG/ML IV SOLN
INTRAVENOUS | Status: DC | PRN
  Administered 2015-12-22 (×3): 30 ug via INTRAVENOUS
  Administered 2015-12-22: 10 ug via INTRAVENOUS
  Administered 2015-12-22: 40 ug via INTRAVENOUS
  Administered 2015-12-22: 30 ug via INTRAVENOUS
  Administered 2015-12-22: 10 ug via INTRAVENOUS

## 2015-12-22 MED ORDER — ASPIRIN 81 MG PO CHEW: 324.0000 mg | CHEWABLE_TABLET | Freq: Every day | ORAL | Status: DC

## 2015-12-22 MED ORDER — PHENYLEPHRINE HCL 10 MG/ML IJ SOLN
0.0000 ug/min | INTRAMUSCULAR | Status: DC
  Filled 2015-12-22 (×2): qty 2

## 2015-12-22 MED ORDER — DEXTROSE 5 % IV SOLN
750.0000 mg | INTRAVENOUS | Status: DC
  Filled 2015-12-22: qty 750

## 2015-12-22 MED ORDER — HEPARIN SODIUM (PORCINE) 1000 UNIT/ML IJ SOLN
INTRAMUSCULAR | Status: AC
  Filled 2015-12-22: qty 1

## 2015-12-22 MED ORDER — BISACODYL 10 MG RE SUPP: 10.0000 mg | Freq: Every day | RECTAL | Status: DC

## 2015-12-22 MED ORDER — METOPROLOL TARTRATE 25 MG/10 ML ORAL SUSPENSION: 12.5000 mg | Freq: Two times a day (BID) | ORAL | Status: DC

## 2015-12-22 MED ORDER — MORPHINE SULFATE (PF) 2 MG/ML IV SOLN
1.0000 mg | INTRAVENOUS | Status: AC | PRN
  Administered 2015-12-22: 1 mg via INTRAVENOUS
  Administered 2015-12-23: 2 mg via INTRAVENOUS
  Filled 2015-12-22 (×2): qty 1

## 2015-12-22 MED ORDER — PROTAMINE SULFATE 10 MG/ML IV SOLN
INTRAVENOUS | Status: DC | PRN
  Administered 2015-12-22: 100 mg via INTRAVENOUS
  Administered 2015-12-22: 10 mg via INTRAVENOUS
  Administered 2015-12-22: 100 mg via INTRAVENOUS

## 2015-12-22 MED ORDER — MIDAZOLAM HCL 2 MG/2ML IJ SOLN
INTRAMUSCULAR | Status: AC
  Filled 2015-12-22: qty 4

## 2015-12-22 MED ORDER — PROPOFOL 10 MG/ML IV BOLUS
INTRAVENOUS | Status: DC | PRN
  Administered 2015-12-22: 110 mg via INTRAVENOUS

## 2015-12-22 MED ORDER — DEXMEDETOMIDINE HCL IN NACL 400 MCG/100ML IV SOLN
0.0000 ug/kg/h | INTRAVENOUS | Status: DC
  Administered 2015-12-23: 0.2 ug/kg/h via INTRAVENOUS
  Filled 2015-12-22: qty 50
  Filled 2015-12-22: qty 100

## 2015-12-22 MED ORDER — VANCOMYCIN HCL IN DEXTROSE 1-5 GM/200ML-% IV SOLN
1000.0000 mg | Freq: Once | INTRAVENOUS | Status: AC
  Administered 2015-12-23: 1000 mg via INTRAVENOUS
  Filled 2015-12-22 (×2): qty 200

## 2015-12-22 MED ORDER — PHENYLEPHRINE HCL 10 MG/ML IJ SOLN
20.0000 mg | INTRAVENOUS | Status: DC | PRN
  Administered 2015-12-22: 40 ug/min via INTRAVENOUS

## 2015-12-22 MED ORDER — OXYCODONE HCL ER 20 MG PO T12A
20.0000 mg | EXTENDED_RELEASE_TABLET | Freq: Two times a day (BID) | ORAL | Status: DC
  Administered 2015-12-23 – 2016-01-05 (×27): 20 mg via ORAL
  Filled 2015-12-22: qty 1
  Filled 2015-12-22 (×13): qty 2
  Filled 2015-12-22: qty 1
  Filled 2015-12-22 (×2): qty 2
  Filled 2015-12-22 (×2): qty 1
  Filled 2015-12-22 (×7): qty 2
  Filled 2015-12-22: qty 1

## 2015-12-22 MED ORDER — HEMOSTATIC AGENTS (NO CHARGE) OPTIME
TOPICAL | Status: DC | PRN
  Administered 2015-12-22: 1 via TOPICAL

## 2015-12-22 MED ORDER — VECURONIUM BROMIDE 10 MG IV SOLR
INTRAVENOUS | Status: AC
  Filled 2015-12-22: qty 10

## 2015-12-22 MED ORDER — SUFENTANIL CITRATE 250 MCG/5ML IV SOLN
INTRAVENOUS | Status: AC
  Filled 2015-12-22: qty 5

## 2015-12-22 MED ORDER — SODIUM CHLORIDE 0.9 % IV SOLN
INTRAVENOUS | Status: DC
  Administered 2015-12-23: 1.4 [IU]/h via INTRAVENOUS
  Filled 2015-12-22 (×2): qty 2.5

## 2015-12-22 MED ORDER — BISACODYL 5 MG PO TBEC
10.0000 mg | DELAYED_RELEASE_TABLET | Freq: Every day | ORAL | Status: DC
  Administered 2015-12-23 – 2015-12-25 (×3): 10 mg via ORAL
  Filled 2015-12-22 (×3): qty 2

## 2015-12-22 MED ORDER — SODIUM CHLORIDE 0.9 % IV SOLN
Freq: Once | INTRAVENOUS | Status: AC
  Administered 2015-12-22: 11:00:00 via INTRAVENOUS

## 2015-12-22 MED ORDER — SODIUM CHLORIDE 0.9% FLUSH
3.0000 mL | Freq: Two times a day (BID) | INTRAVENOUS | Status: DC
  Administered 2015-12-23 – 2015-12-25 (×5): 3 mL via INTRAVENOUS

## 2015-12-22 MED ORDER — 0.9 % SODIUM CHLORIDE (POUR BTL) OPTIME
TOPICAL | Status: DC | PRN
  Administered 2015-12-22: 2000 mL

## 2015-12-22 MED ORDER — MORPHINE SULFATE (PF) 2 MG/ML IV SOLN
2.0000 mg | INTRAVENOUS | Status: DC | PRN
  Administered 2015-12-22 (×2): 2 mg via INTRAVENOUS
  Filled 2015-12-22 (×3): qty 1

## 2015-12-22 MED ORDER — ASPIRIN EC 325 MG PO TBEC
325.0000 mg | DELAYED_RELEASE_TABLET | Freq: Every day | ORAL | Status: DC
  Administered 2015-12-23 – 2015-12-25 (×3): 325 mg via ORAL
  Filled 2015-12-22 (×3): qty 1

## 2015-12-22 MED ORDER — LACTATED RINGERS IV SOLN: INTRAVENOUS | Status: DC

## 2015-12-22 MED ORDER — POTASSIUM CHLORIDE 10 MEQ/50ML IV SOLN: 10.0000 meq | INTRAVENOUS | Status: AC

## 2015-12-22 MED ORDER — LACTATED RINGERS IV SOLN
INTRAVENOUS | Status: DC | PRN
  Administered 2015-12-22: 15:00:00 via INTRAVENOUS

## 2015-12-22 MED ORDER — DOCUSATE SODIUM 100 MG PO CAPS
200.0000 mg | ORAL_CAPSULE | Freq: Every day | ORAL | Status: DC
  Administered 2015-12-23 – 2015-12-25 (×3): 200 mg via ORAL
  Filled 2015-12-22 (×3): qty 2

## 2015-12-22 MED ORDER — SODIUM CHLORIDE 0.9% FLUSH
3.0000 mL | INTRAVENOUS | Status: DC | PRN
Start: 2015-12-23 — End: 2015-12-25

## 2015-12-22 MED ORDER — ACETAMINOPHEN 500 MG PO TABS
1000.0000 mg | ORAL_TABLET | Freq: Four times a day (QID) | ORAL | Status: DC
  Administered 2015-12-23 – 2015-12-25 (×9): 1000 mg via ORAL
  Filled 2015-12-22 (×9): qty 2

## 2015-12-22 MED ORDER — SODIUM CHLORIDE 0.9 % IV SOLN: INTRAVENOUS | Status: DC

## 2015-12-22 MED ORDER — HEPARIN SODIUM (PORCINE) 1000 UNIT/ML IJ SOLN
INTRAMUSCULAR | Status: DC | PRN
  Administered 2015-12-22: 23000 [IU] via INTRAVENOUS

## 2015-12-22 MED ORDER — HEMOSTATIC AGENTS (NO CHARGE) OPTIME
TOPICAL | Status: DC | PRN
  Administered 2015-12-22: 3 via TOPICAL

## 2015-12-22 MED ORDER — MAGNESIUM SULFATE 4 GM/100ML IV SOLN
4.0000 g | Freq: Once | INTRAVENOUS | Status: AC
  Administered 2015-12-22: 4 g via INTRAVENOUS
  Filled 2015-12-22: qty 100

## 2015-12-22 MED ORDER — ARTIFICIAL TEARS OP OINT
TOPICAL_OINTMENT | OPHTHALMIC | Status: AC
  Filled 2015-12-22: qty 3.5

## 2015-12-22 MED ORDER — ALBUMIN HUMAN 5 % IV SOLN
250.0000 mL | INTRAVENOUS | Status: AC | PRN
Start: 2015-12-22 — End: 2015-12-23
  Administered 2015-12-22 – 2015-12-23 (×2): 250 mL via INTRAVENOUS

## 2015-12-22 MED ORDER — CHLORHEXIDINE GLUCONATE 0.12 % MT SOLN
15.0000 mL | OROMUCOSAL | Status: AC
  Administered 2015-12-22: 15 mL via OROMUCOSAL

## 2015-12-22 MED ORDER — INSULIN REGULAR BOLUS VIA INFUSION
0.0000 [IU] | Freq: Three times a day (TID) | INTRAVENOUS | Status: DC
  Filled 2015-12-22: qty 10

## 2015-12-22 MED FILL — Heparin Sodium (Porcine) Inj 1000 Unit/ML: INTRAMUSCULAR | Qty: 30 | Status: AC

## 2015-12-22 MED FILL — Potassium Chloride Inj 2 mEq/ML: INTRAVENOUS | Qty: 40 | Status: AC

## 2015-12-22 MED FILL — Magnesium Sulfate Inj 50%: INTRAMUSCULAR | Qty: 10 | Status: AC

## 2015-12-22 SURGICAL SUPPLY — 82 items
ADAPTER CARDIO PERF ANTE/RETRO (ADAPTER) ×4 IMPLANT
BAG DECANTER FOR FLEXI CONT (MISCELLANEOUS) ×4 IMPLANT
BLADE STERNUM SYSTEM 6 (BLADE) ×4 IMPLANT
BLADE SURG 15 STRL LF DISP TIS (BLADE) ×2 IMPLANT
BLADE SURG 15 STRL SS (BLADE) ×2
CANISTER SUCTION 2500CC (MISCELLANEOUS) ×4 IMPLANT
CANN PRFSN 3/8X14X24FR PCFC (MISCELLANEOUS) ×2
CANNULA ARTERIAL NVNT 3/8 22FR (MISCELLANEOUS) IMPLANT
CANNULA EZ GLIDE AORTIC 21FR (CANNULA) ×4 IMPLANT
CANNULA GUNDRY RCSP 15FR (MISCELLANEOUS) IMPLANT
CANNULA PRFSN 3/8X14X24FR PCFC (MISCELLANEOUS) ×2 IMPLANT
CANNULA SUMP PERICARDIAL (CANNULA) ×4 IMPLANT
CANNULA VEN MTL TIP RT (MISCELLANEOUS) ×2
CANNULA VRC MALB SNGL STG 32FR (MISCELLANEOUS) ×2 IMPLANT
CATH ROBINSON RED A/P 18FR (CATHETERS) ×4 IMPLANT
CLIP FOGARTY SPRING 6M (CLIP) IMPLANT
CONN 1/2X1/2X1/2  BEN (MISCELLANEOUS) ×4
CONN 1/2X1/2X1/2 BEN (MISCELLANEOUS) ×4 IMPLANT
CONN 3/8X1/2 ST GISH (MISCELLANEOUS) ×8 IMPLANT
CONN Y 3/8X3/8X3/8  BEN (MISCELLANEOUS)
CONN Y 3/8X3/8X3/8 BEN (MISCELLANEOUS) IMPLANT
CONT SPEC 4OZ CLIKSEAL STRL BL (MISCELLANEOUS) ×4 IMPLANT
CRADLE DONUT ADULT HEAD (MISCELLANEOUS) ×4 IMPLANT
DRAPE CARDIOVASCULAR INCISE (DRAPES) ×2
DRAPE SLUSH/WARMER DISC (DRAPES) IMPLANT
DRAPE SRG 135X102X78XABS (DRAPES) ×2 IMPLANT
DRSG COVADERM 4X14 (GAUZE/BANDAGES/DRESSINGS) ×4 IMPLANT
ELECT CAUTERY BLADE 6.4 (BLADE) IMPLANT
ELECT REM PT RETURN 9FT ADLT (ELECTROSURGICAL) ×8
ELECTRODE REM PT RTRN 9FT ADLT (ELECTROSURGICAL) ×4 IMPLANT
FELT TEFLON 1X6 (MISCELLANEOUS) ×4 IMPLANT
GAUZE SPONGE 4X4 12PLY STRL (GAUZE/BANDAGES/DRESSINGS) ×8 IMPLANT
GLOVE SURG SIGNA 7.5 PF LTX (GLOVE) IMPLANT
GOWN STRL REUS W/ TWL LRG LVL3 (GOWN DISPOSABLE) ×8 IMPLANT
GOWN STRL REUS W/TWL LRG LVL3 (GOWN DISPOSABLE) ×8
HEMOSTAT POWDER SURGIFOAM 1G (HEMOSTASIS) IMPLANT
HEMOSTAT SURGICEL 2X14 (HEMOSTASIS) IMPLANT
INSERT FOGARTY XLG (MISCELLANEOUS) IMPLANT
KIT BASIN OR (CUSTOM PROCEDURE TRAY) ×4 IMPLANT
KIT ROOM TURNOVER OR (KITS) ×4 IMPLANT
KIT SUCTION CATH 14FR (SUCTIONS) ×8 IMPLANT
LINE VENT (MISCELLANEOUS) ×4 IMPLANT
NS IRRIG 1000ML POUR BTL (IV SOLUTION) ×16 IMPLANT
PACK OPEN HEART (CUSTOM PROCEDURE TRAY) ×4 IMPLANT
PAD ARMBOARD 7.5X6 YLW CONV (MISCELLANEOUS) ×8 IMPLANT
SET CARDIOPLEGIA MPS 5001102 (MISCELLANEOUS) ×4 IMPLANT
SPONGE GAUZE 4X4 12PLY STER LF (GAUZE/BANDAGES/DRESSINGS) ×4 IMPLANT
SUCKER WEIGHTED FLEX (MISCELLANEOUS) ×4 IMPLANT
SUT ETHIBOND 2 0 SH (SUTURE) ×16 IMPLANT
SUT ETHIBOND 2 0 SH 36X2 (SUTURE) ×4 IMPLANT
SUT ETHIBOND 2 0 V4 (SUTURE) IMPLANT
SUT ETHIBOND 2 0V4 GREEN (SUTURE) IMPLANT
SUT PROLENE 3 0 SH1 36 (SUTURE) ×4 IMPLANT
SUT PROLENE 4 0 RB 1 (SUTURE) ×12
SUT PROLENE 4-0 RB1 .5 CRCL 36 (SUTURE) ×12 IMPLANT
SUT PROLENE 5 0 C 1 36 (SUTURE) ×8 IMPLANT
SUT PROLENE 5 0 CC1 (SUTURE) ×4 IMPLANT
SUT SILK  1 MH (SUTURE) ×4
SUT SILK 1 MH (SUTURE) ×4 IMPLANT
SUT SILK 1 TIES 10X30 (SUTURE) ×4 IMPLANT
SUT SILK 2 0 (SUTURE) ×2
SUT SILK 2 0 SH CR/8 (SUTURE) ×8 IMPLANT
SUT SILK 2-0 18XBRD TIE 12 (SUTURE) ×2 IMPLANT
SUT SILK 3 0 SH CR/8 (SUTURE) ×4 IMPLANT
SUT SILK 4 0 (SUTURE) ×2
SUT SILK 4-0 18XBRD TIE 12 (SUTURE) ×2 IMPLANT
SUT STEEL 6MS V (SUTURE) IMPLANT
SUT TEM PAC WIRE 2 0 SH (SUTURE) ×16 IMPLANT
SUT VIC AB 1 CTX 36 (SUTURE)
SUT VIC AB 1 CTX36XBRD ANBCTR (SUTURE) IMPLANT
SUT VIC AB 2-0 CTX 27 (SUTURE) IMPLANT
SUT VIC AB 3-0 X1 27 (SUTURE) IMPLANT
SYSTEM SAHARA CHEST DRAIN ATS (WOUND CARE) ×4 IMPLANT
TAPE CLOTH SURG 4X10 WHT LF (GAUZE/BANDAGES/DRESSINGS) ×4 IMPLANT
TAPE PAPER 2X10 WHT MICROPORE (GAUZE/BANDAGES/DRESSINGS) ×4 IMPLANT
TOWEL OR 17X24 6PK STRL BLUE (TOWEL DISPOSABLE) ×4 IMPLANT
TOWEL OR 17X26 10 PK STRL BLUE (TOWEL DISPOSABLE) ×4 IMPLANT
TRAY FOLEY IC TEMP SENS 16FR (CATHETERS) ×4 IMPLANT
UNDERPAD 30X30 INCONTINENT (UNDERPADS AND DIAPERS) ×4 IMPLANT
VALVE MAGNA MITRAL 31MM (Prosthesis & Implant Heart) ×4 IMPLANT
VRC MALLEABLE SINGLE STG 32FR (MISCELLANEOUS) ×4
WATER STERILE IRR 1000ML POUR (IV SOLUTION) ×8 IMPLANT

## 2015-12-22 NOTE — Anesthesia Procedure Notes (Signed)
Procedure Name: Intubation Date/Time: 12/22/2015 3:18 PM Performed by: Charm BargesBUTLER, Damiano Stamper R Pre-anesthesia Checklist: Patient identified, Emergency Drugs available, Suction available, Patient being monitored and Timeout performed Patient Re-evaluated:Patient Re-evaluated prior to inductionOxygen Delivery Method: Circle System Utilized Preoxygenation: Pre-oxygenation with 100% oxygen Intubation Type: IV induction Ventilation: Mask ventilation without difficulty Laryngoscope Size: Mac and 3 Grade View: Grade I Tube type: Subglottic suction tube Tube size: 8.0 mm Number of attempts: 1 Airway Equipment and Method: Stylet Placement Confirmation: ETT inserted through vocal cords under direct vision,  positive ETCO2 and breath sounds checked- equal and bilateral Tube secured with: Tape Dental Injury: Teeth and Oropharynx as per pre-operative assessment

## 2015-12-22 NOTE — Brief Op Note (Addendum)
12/13/2015 - 12/22/2015  6:00 PM  PATIENT:  Madeline ShropshireLori Kaye Fairhurst  25 y.o. female  PRE-OPERATIVE DIAGNOSIS:  1. TRICUSPID VALVE ENDOCARDITIS 2. SEVERE TR 3. PATENT FORAMEN OVALE  POST-OPERATIVE DIAGNOSIS:   1. TRICUSPID VALVE ENDOCARDITIS 2. SEVERE TR 3. PATENT FORAMEN OVALE  PROCEDURE:  TRANSESOPHAGEAL ECHOCARDIOGRAM (TEE),  MEDIAN STERNOTOMY for  TRICUSPID VALVE REPLACEMENT with 31mm [facility] Magna Ease Pericardial Tissue Valve (Model # 7300TFX, serial # [id number]), and  CLOSURE of PATENT FORAMEN OVALE  SURGEON:  Surgeon(s) and Role:    * Loreli SlotSteven C Rykin Route, MD - Primary  PHYSICIAN ASSISTANT: Doree Fudgeonielle Zimmerman PA-C  ANESTHESIA:   general  EBL:  Total I/O In: 402.5 [Blood:402.5] Out: 1550 [Urine:1300; Blood:250]   DRAINS: Chest tubes placed in the mediastinal and pleural spaces   SPECIMEN:  Source of Specimen:  Tricuspid vegetation   DISPOSITION OF SPECIMEN:  PATHOLOGY  COUNTS CORRECT:  YES  PLAN OF CARE: Admit to inpatient   PATIENT DISPOSITION:  ICU - intubated and hemodynamically stable.   Delay start of Pharmacological VTE agent (>24hrs) due to surgical blood loss or risk of bleeding: yes  BASELINE WEIGHT: 52 kg   Tricuspid Etiology  Tricuspid Insufficiency: Severe  TV Disease: No.   Etiology (Choose at least one and up to five): Endocarditis.   Mitral/Tricuspid/Pulmonary Valve Procedure  Mitral Valve Procedure Performed:  N/A  Implant: N/A  Tricuspid Valve Procedure Performed:  Replacement.  Implant: Bioprosthetic Valve; Implant Model Number 7300TFX, Size 31, Unique Device Identifier [id number]  Pulmonary Valve Procedure Performed:N/A  Implant: N/A

## 2015-12-22 NOTE — Progress Notes (Signed)
Echocardiogram Echocardiogram Transesophageal has been performed.  Dorothey BasemanReel, Ranisha Allaire M 12/22/2015, 3:53 PM

## 2015-12-22 NOTE — Progress Notes (Signed)
Report called to Surgical Short stay. Patient left floor by stretcher. Mother present at the time of transfer.

## 2015-12-22 NOTE — Anesthesia Preprocedure Evaluation (Addendum)
Anesthesia Evaluation  Patient identified by MRN, date of birth, ID band Patient awake    Reviewed: Allergy & Precautions, NPO status , Patient's Chart, lab work & pertinent test results  Airway Mallampati: I  TM Distance: >3 FB Neck ROM: Full    Dental  (+) Poor Dentition, Dental Advisory Given   Pulmonary Current Smoker,    + rhonchi  + decreased breath sounds      Cardiovascular  Rhythm:Regular + Systolic murmurs    Neuro/Psych    GI/Hepatic (+) Hepatitis -, C  Endo/Other    Renal/GU      Musculoskeletal   Abdominal   Peds  Hematology   Anesthesia Other Findings   Reproductive/Obstetrics                            Anesthesia Physical Anesthesia Plan  ASA: III  Anesthesia Plan: General   Post-op Pain Management:    Induction: Intravenous  Airway Management Planned: Oral ETT  Additional Equipment: Arterial line, CVP, PA Cath, TEE, 3D TEE and Ultrasound Guidance Line Placement  Intra-op Plan:   Post-operative Plan: Post-operative intubation/ventilation  Informed Consent: I have reviewed the patients History and Physical, chart, labs and discussed the procedure including the risks, benefits and alternatives for the proposed anesthesia with the patient or authorized representative who has indicated his/her understanding and acceptance.   Dental advisory given  Plan Discussed with: CRNA, Anesthesiologist and Surgeon  Anesthesia Plan Comments:         Anesthesia Quick Evaluation

## 2015-12-22 NOTE — Progress Notes (Signed)
Attempt made to wean again. Pt immediately severely agitated with a RASS of 3, RR 40-55 BPM, and pulling at tube. Precedex currently at 0.285mcg. Charge RN, RT, and myself all decided to cancel the wean and attempt again in the AM due to failure x2 and pt agitation. Will continue to closely monitor. Modena JanskyKevin Roizy Harold RN [address] [place]

## 2015-12-22 NOTE — Progress Notes (Addendum)
Attempt made to wean but pt became very agitated and RR climbed to 40bpm and above. RT in room and assessed with RN. Decision made to restart precedex at 0.412mcg and try again once the pt was calm. Modena JanskyKevin Maalle Starrett RN [address] [place]

## 2015-12-22 NOTE — Anesthesia Postprocedure Evaluation (Signed)
Anesthesia Post Note  Patient: Madeline Andrade  Procedure(s) Performed: Procedure(s) (LRB): TRICUSPID VALVE Replacement (N/A) TRANSESOPHAGEAL ECHOCARDIOGRAM (TEE) (N/A)  Patient location during evaluation: SICU Anesthesia Type: General Level of consciousness: sedated and patient remains intubated per anesthesia plan Pain management: pain level controlled Vital Signs Assessment: post-procedure vital signs reviewed and stable Respiratory status: patient remains intubated per anesthesia plan Cardiovascular status: stable Anesthetic complications: no    Last Vitals:  Filed Vitals:   12/22/15 2000 12/22/15 2015  BP: 121/93 83/67  Pulse: 96 109  Temp: 37.5 C 37.6 C  Resp: 19 24    Last Pain:  Filed Vitals:   12/22/15 2016  PainSc: 3                  Disha Cottam,JAMES TERRILL

## 2015-12-22 NOTE — Interval H&P Note (Signed)
History and Physical Interval Note:  12/22/2015 2:44 PM  Madeline ShropshireLori Kaye Mayorquin  has presented today for surgery, with the diagnosis of SEVERE TR TV ENDOCARDITIS  The various methods of treatment have been discussed with the patient and family. After consideration of risks, benefits and other options for treatment, the patient has consented to  Procedure(s): TRICUSPID VALVE REPAIR OR REPLACEMENT (N/A) TRANSESOPHAGEAL ECHOCARDIOGRAM (TEE) (N/A) as a surgical intervention .  The patient's history has been reviewed, patient examined, no change in status, stable for surgery.  I have reviewed the patient's chart and labs.  Questions were answered to the patient's satisfaction.     Loreli SlotSteven C Lanard Arguijo
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
## 2015-12-22 NOTE — Transfer of Care (Signed)
Immediate Anesthesia Transfer of Care Note  Patient: Madeline Andrade  Procedure(s) Performed: Procedure(s): TRICUSPID VALVE Replacement (N/A) TRANSESOPHAGEAL ECHOCARDIOGRAM (TEE) (N/A)  Patient Location: ICU  Anesthesia Type:General  Level of Consciousness: unresponsive and Patient remains intubated per anesthesia plan  Airway & Oxygen Therapy: Patient remains intubated per anesthesia plan and Patient placed on Ventilator (see vital sign flow sheet for setting)  Post-op Assessment: Report given to RN and Post -op Vital signs reviewed and stable  Post vital signs: Reviewed and stable  Last Vitals:  Filed Vitals:   12/22/15 1459 12/22/15 1500  BP:    Pulse: 99 99  Temp:    Resp: 19 20    Complications: No apparent anesthesia complications
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
## 2015-12-22 NOTE — Procedures (Signed)
Attempted to wean pt but she failed immediately due to increase respiratory rate >40.

## 2015-12-22 NOTE — CV Procedure (Signed)
Intra-operative Transesophageal Echocardiography Report:  Madeline MimesLori Kaye Andrade is a 25 year old female with a history of IV drug abuse and hepatitis C who developed endocarditis involving the tricuspid valve with blood cultures positive for methicillin sensitive staph aureus. She was found to have a large vegetation on the tricuspid valve, a patent foramen ovale, and septic pulmonary emboli. She is now scheduled to undergo tricuspid valve repair or replacement by Dr. Dorris FetchHendrickson. Intraoperative transesophageal echocardiography was indicated to evaluate the tricuspid valve, to assess for any other valvular pathology, and to serve as a monitor for intraoperative volume status and  intracardiac air.  The patient [facility] and general anesthesia was induced without difficulty. Following endotracheal intubation and orogastric suctioning, the transesophageal echocardiography probe was inserted into the esophagus without difficulty.  Impression: Pre-bypass findings:  1. Aortic valve: The aortic valve appeared normal. The leaflets were thin and opened without restriction. There was no aortic insufficiency. There were no vegetations involving the aortic valve.  2. Mitral valve: The mitral valve appeared normal. There were no vegetations noted on the mitral leaflets. The mitral leaflets opened normally without restriction. There was trace mitral insufficiency.  3. Left ventricle: The left ventricular cavity was normal in size and measured 4.5 cm at end diastole at the mid-papillary level in the transgastric short axis view. Left ventricular wall thickness was normal and measured 0.78 cm at the anterior wall and 0.76 cm at the posterior wall at end diastole at the mid-papillary level. There appeared to be normal left ventricular contractility in the transgastric short axis view. However in the 4 chamber, 2 chamber, and aortic outflow views there appeared to be some degree  of mild to moderate left ventricular dyssynchrony of contraction. There were no regional regional wall motion abnormalities, thinned, or akinetic segments noted. The ejection fraction was estimated at 55-60%.  4. Right ventricle. There was moderate right ventricular enlargement. There was normal contractility of the right ventricular free wall.  5. Tricuspid valve: There was a large vegetation noted on the tricuspid valve. This appeared to involve primarily the anterior leaflet but there appeared to be possible involvement of the posterior leaflet as well. The septal leaflet appeared free of vegetations. There was severe tricuspid insufficiency.  6. Pulmonic valve: The pulmonic valve appeared to open normally. There were no vegetations involving the pulmonic valve. There was no aortic insufficiency noted.  7. Interatrial septum: With color Doppler a patent foramen ovale could not be appreciated. However with a carefully performed bubble study using agitated 5% albumin and Valsalva maneuver, a patent foramen ovale could be appreciated. The bubble study was therefore positive.  7. Left atrium: Left atrial cavity was normal in size. There was no thrombus noted within the left atrium or left atrial appendage.  9. Ascending aorta: The ascending aorta showed a normal appearing aortic root and sinotubular ridge. There was no atheromatous disease noted within the walls of the ascending aorta. There was no effacement or dilatation of the ascending aorta.  10. Descending aorta: The descending aorta was of normal diameter and measured 1.49 cm. There was no atheromatous disease noted within the walls of descending aorta.   11.There were bilateral op pleural effusions noted.   12. Pericardial space: There appeared to be no evidence of a pericardial effusion noted.   Post-bypass findings:  1. Tricuspid valve: There was a bioprosthetic valve in the tricuspid position. The valve was well-seated without  rocking. There was no perivalvular insufficiency.  There was a jet of central jet of tricuspid insufficiency which was judged as trivial. The leaflets opened normally without restriction. The mean transtricuspid gradient was 2 mmHg.   2. Interatrial Septum: No residual patent foramen ovale could be appreciated with color doppler.  Kipp Brood, MD

## 2015-12-22 NOTE — H&P (View-Only) (Signed)
Reason for Consult:Tricuspid valve endocarditis Referring Physician: THRoyanne Foots ID- CAMPBELL, MERSADIE KAVANAUGH Iracema Lanagan is an 25 y.o. female.  HPI: 25 yo woman with history of IVDA and hepatitis C. She presents with a cc/o fever, chills, cough and chest pain. She has been feeling poorly for over a month. She had recently completed a stent in inpatient rehab for heroin use. She had been discharged and then started using drugs again shortly thereafter. She intially had general malaise and fatigue. Then subsequently developed fevers and chills, a cough and chest pain. She was admitted to [facility] a couple of weeks ago. She was started on antibiotics for MSSA endocarditis, but left AMA. She returned 4/3 with worsening symptoms.  She is currently on Ancef. She had a TEE today which showed a large vegetation of the tricuspid valve with a mobile vegetation. There also was a PFO. Her pain and cough have improved since admission btu she does still get SOB with minimal activity.   Past Medical History  Diagnosis Date  . Hepatitis C   . Gall bladder disease   . Heroin abuse     Past Surgical History  Procedure Laterality Date  . Cesarean section      Family History  Problem Relation Age of Onset  . Multiple sclerosis Mother   . Hypertension Father   . Diabetes Mellitus I Father     Social History:  reports that she has been smoking Cigarettes.  She has been smoking about 1.00 pack per day. She does not have any smokeless tobacco history on file. She reports that she drinks alcohol. She reports that she uses illicit drugs.  Allergies: No Known Allergies  Medications:  Scheduled: . busPIRone  7.5 mg Oral TID  .  ceFAZolin (ANCEF) IV  2 g Intravenous 3 times per day  . cloNIDine  0.1 mg Oral BID  . enoxaparin (LOVENOX) injection  40 mg Subcutaneous Q24H  . multivitamin with minerals  1 tablet Oral Daily  . naloxegol oxalate  12.5 mg Oral Daily  . nicotine  21 mg Transdermal Daily  .  oxyCODONE  20 mg Oral Q12H  . polyethylene glycol  17 g Oral Daily  . senna  2 tablet Oral QHS  . sodium chloride flush  3 mL Intravenous Q12H  . thiamine  100 mg Oral Daily    Results for orders placed or performed during the hospital encounter of 12/13/15 (from the past 48 hour(s))  Culture, blood (routine x 2)     Status: None (Preliminary result)   Collection Time: 12/18/15  9:10 PM  Result Value Ref Range   Specimen Description BLOOD LEFT ARM    Special Requests IN PEDIATRIC BOTTLE 3CC    Culture NO GROWTH 2 DAYS    Report Status PENDING   Culture, blood (routine x 2)     Status: None (Preliminary result)   Collection Time: 12/18/15  9:15 PM  Result Value Ref Range   Specimen Description BLOOD RIGHT HAND    Special Requests IN PEDIATRIC BOTTLE 3CC    Culture NO GROWTH 2 DAYS    Report Status PENDING   Pregnancy, urine     Status: None   Collection Time: 12/18/15 11:52 PM  Result Value Ref Range   Preg Test, Ur NEGATIVE NEGATIVE    Comment:        THE SENSITIVITY OF THIS METHODOLOGY IS >20 mIU/mL.   CBC     Status: Abnormal   Collection Time: 12/19/15  3:55 AM  Result Value Ref Range   WBC 18.3 (H) 4.0 - 10.5 K/uL   RBC 2.70 (L) 3.87 - 5.11 MIL/uL   Hemoglobin 7.1 (L) 12.0 - 15.0 g/dL   HCT 22.9 (L) 36.0 - 46.0 %   MCV 84.8 78.0 - 100.0 fL   MCH 26.3 26.0 - 34.0 pg   MCHC 31.0 30.0 - 36.0 g/dL   RDW 15.1 11.5 - 15.5 %   Platelets 275 150 - 400 K/uL  Basic metabolic panel     Status: Abnormal   Collection Time: 12/19/15  3:55 AM  Result Value Ref Range   Sodium 134 (L) 135 - 145 mmol/L   Potassium 4.1 3.5 - 5.1 mmol/L    Comment: DELTA CHECK NOTED   Chloride 99 (L) 101 - 111 mmol/L   CO2 24 22 - 32 mmol/L   Glucose, Bld 117 (H) 65 - 99 mg/dL   BUN 10 6 - 20 mg/dL   Creatinine, Ser 0.54 0.44 - 1.00 mg/dL   Calcium 8.1 (L) 8.9 - 10.3 mg/dL   GFR calc non Af Amer >60 >60 mL/min   GFR calc Af Amer >60 >60 mL/min    Comment: (NOTE) The eGFR has been calculated  using the CKD EPI equation. This calculation has not been validated in all clinical situations. eGFR's persistently <60 mL/min signify possible Chronic Kidney Disease.    Anion gap 11 5 - 15  CBC     Status: Abnormal   Collection Time: 12/20/15  3:29 AM  Result Value Ref Range   WBC 19.5 (H) 4.0 - 10.5 K/uL   RBC 2.57 (L) 3.87 - 5.11 MIL/uL   Hemoglobin 6.9 (LL) 12.0 - 15.0 g/dL    Comment: CRITICAL RESULT CALLED TO, READ BACK BY AND VERIFIED WITH: TO FJONES(RN) BY TCLEVELAND 12/20/2015 AT 4:34AM REPEATED TO VERIFY    HCT 21.6 (L) 36.0 - 46.0 %   MCV 84.0 78.0 - 100.0 fL   MCH 26.8 26.0 - 34.0 pg   MCHC 31.9 30.0 - 36.0 g/dL   RDW 15.3 11.5 - 15.5 %   Platelets 343 150 - 400 K/uL  ABO/Rh     Status: None   Collection Time: 12/20/15  5:17 AM  Result Value Ref Range   ABO/RH(D) O POS   Prepare RBC     Status: None   Collection Time: 12/20/15  5:17 AM  Result Value Ref Range   Order Confirmation ORDER PROCESSED BY BLOOD BANK   Type and screen [place]     Status: None (Preliminary result)   Collection Time: 12/20/15  5:20 AM  Result Value Ref Range   ABO/RH(D) O POS    Antibody Screen NEG    Sample Expiration 12/23/2015    Unit Number [id number]    Blood Component Type RBC LR PHER2    Unit division 00    Status of Unit ISSUED    Transfusion Status OK TO TRANSFUSE    Crossmatch Result Compatible    Unit Number [id number]    Blood Component Type RED CELLS,LR    Unit division 00    Status of Unit ISSUED    Transfusion Status OK TO TRANSFUSE    Crossmatch Result Compatible   Hemoglobin and hematocrit, blood     Status: Abnormal   Collection Time: 12/20/15  4:09 PM  Result Value Ref Range   Hemoglobin 9.8 (L) 12.0 - 15.0 g/dL    Comment: REPEATED TO VERIFY DELTA CHECK NOTED  POST TRANSFUSION SPECIMEN    HCT 29.8 (L) 36.0 - 46.0 %    Ct Abdomen Pelvis W Contrast  12/19/2015  CLINICAL DATA:  25 year old with current history of intravenous drug  use, staphylococcal bacteremia with sepsis, bacterial endocarditis and septic emboli to the lungs. Current history of hepatitis-C. Acute onset of right upper quadrant abdominal pain. EXAM: CT ABDOMEN AND PELVIS WITH CONTRAST TECHNIQUE: Multidetector CT imaging of the abdomen and pelvis was performed using the standard protocol following bolus administration of intravenous contrast. CONTRAST:  153m ISOVUE-300 IOPAMIDOL INJECTION 61% IV. Oral contrast was also administered. COMPARISON:  06/29/2015, 01/03/2015 and earlier. FINDINGS: Lower chest: Numerous nodular opacities and peripheral airspace opacities with cavitation in the visualized lung bases indicative of septic emboli. Small bilateral pleural effusions, right greater than left. Heart mildly enlarged, having increased in size since October, 2016. Hepatobiliary: Hepatomegaly. No focal hepatic parenchymal abnormality. Anatomic variant in that the left lobe of liver extends well across the midline into the left upper quadrant. Pancreas: Normal in appearance without evidence of mass, ductal dilation, or inflammation. Spleen: Mild splenomegaly, the spleen measuring approximately 10.9 x 6.7 x 13.9 cm yielding a volume of approximately 508 ml. No focal parenchymal abnormality. Adrenals/Urinary Tract: Normal appearing adrenal glands. Kidneys normal in size and appearance without focal parenchymal abnormality. No evidence of urinary tract calculi or obstruction. Normal-appearing urinary bladder. Stomach/Bowel: Stomach normal in appearance for the degree of distention. Normal-appearing small bowel. Large stool burden throughout normal appearing colon. Cecum extends low in the right side of the pelvis. Appendix not clearly visualized, but no pericecal inflammation. Vascular/Lymphatic: No visible aortoiliofemoral atherosclerosis. Widely patent visceral arteries. Normal-appearing portal venous and systemic venous systems. No pathologic lymphadenopathy. Reproductive:  Normal-appearing uterus and ovaries without evidence of adnexal mass. Other: None. Musculoskeletal: No acute or significant abnormality. IMPRESSION: 1. No acute abnormalities involving the abdomen or pelvis. 2. Hepatosplenomegaly. No focal parenchymal abnormality involving the liver or spleen. 3. Large colonic stool burden. 4. Septic emboli with nodular and confluent airspace opacities in the visualized lung bases, many of which demonstrate cavitation. 5. Mild cardiomegaly, with interval increase in heart size when compared to the prior CT from October, 2016. Electronically Signed   By: TEvangeline DakinM.D.   On: 12/19/2015 07:44   Dg Chest Port 1 View  12/19/2015  CLINICAL DATA:  Shortness of breath. EXAM: PORTABLE CHEST 1 VIEW COMPARISON:  12/17/2015 FINDINGS: Again noted are patchy parenchymal lung densities, right side greater than left. Slightly more confluent densities at the left lung base. Heart size is stable and within normal limits. The trachea is midline. IMPRESSION: Persistent bilateral parenchymal lung densities are suspicious for infection. Slightly increased densities at the left lung base. Electronically Signed   By: AMarkus DaftM.D.   On: 12/19/2015 08:54    Review of Systems  Constitutional: Positive for fever, chills and malaise/fatigue.  HENT: Negative for hearing loss.   Respiratory: Positive for cough and shortness of breath.   Cardiovascular: Positive for chest pain and leg swelling.  Gastrointestinal: Negative for blood in stool.  Genitourinary: Negative for dysuria and hematuria.  Musculoskeletal: Positive for myalgias and joint pain.  Neurological: Negative for seizures, loss of consciousness and headaches.   Blood pressure 103/53, pulse 117, temperature 97.7 F (36.5 C), temperature source Oral, resp. rate 20, height 5' 7"  (1.702 m), weight 111 lb 9.6 oz (50.621 kg), SpO2 100 %. Physical Exam  Vitals reviewed. Constitutional: She is oriented to person, place, and time.  She appears  well-developed. No distress.  cachectic  HENT:  Head: Normocephalic.  Multiple abrasions, lacerations, and ecchymoses around face  Eyes: Conjunctivae and EOM are normal. No scleral icterus.  Neck: Neck supple. JVD present. No thyromegaly present.  Cardiovascular: Regular rhythm and intact distal pulses.   Murmur heard. tachycardic  Respiratory: Effort normal and breath sounds normal. She has no wheezes. She has no rales.  GI: Bowel sounds are normal. She exhibits no distension. There is no tenderness.  Musculoskeletal: She exhibits edema (trace).  Lymphadenopathy:    She has no cervical adenopathy.  Neurological: She is alert and oriented to person, place, and time. No cranial nerve deficit.  No focal motor deficit  Skin: Skin is warm and dry.   TRANSESOPHAGEAL ECHOCARDIOGRAM Left Ventrical: Normal LV function   Mitral Valve: normal , no vegetation   Aortic Valve: normal , no vegetation   Tricuspid Valve: Severe TR. there is a very large vegetation that has completely destroyed one of the leaflets. The leaflet is perforated.  Pulmonic Valve: trivial PI, No vegetation   Left Atrium/ Left atrial appendage: no thrombi  Atrial septum: There is a PFO by color flow. We also noticed several bubble from the IV traverse from the RA to LA during the course of the procedure.   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 She has severe TR with a large vegetation that has essentially destroyed one leaflet of the valve. She also was noted to have a PFO. She is at risk for embolization either to the lungs(and already has evidence of septic pulmonary emboli) or systemically via the PFO.   Tricuspid repair or replacement is indicated secondary to embolic risk. She is also likely to develop worsening right heart failure due to the severity of her MR.  She is a difficult patient due to her history of drug abuse and  non-compliance.  We will assess the valve for possible repair but in all likelihood she will need a replacement. She is not a candidate for coumadin so the only option may be tissue valve replacement. If so, she will likely need redo procedures in the future.  I discussed the general nature of the procedure, the need for general anesthesia, the use of cardiopulmonary bypass, and the incision (sternotomy) to be used with Mrs. Harewood. We discussed the expected hospital stay, overall recovery and short and long term outcomes. She understands the high risk of reinfection if she continues to use drugs. She understands the risks include, but are not limited to death, stroke, MI, DVT/PE, bleeding, possible need for transfusion, infections, cardiac arrhythmias, complete heart block necessitating a pacemaker, as well as other organ system dysfunction including respiratory, renal, or GI complications. She accepts the risks and agrees to proceed.  Plan TVR later this week.  Melrose Nakayama 12/20/2015, 5:32 PM

## 2015-12-22 NOTE — Procedures (Signed)
Pt failed wean x2 due to increase rr at 41.

## 2015-12-23 ENCOUNTER — Inpatient Hospital Stay (HOSPITAL_COMMUNITY): Payer: Self-pay

## 2015-12-23 ENCOUNTER — Encounter (HOSPITAL_COMMUNITY): Payer: Self-pay | Admitting: Thoracic Surgery (Cardiothoracic Vascular Surgery)

## 2015-12-23 DIAGNOSIS — Z954 Presence of other heart-valve replacement: Secondary | ICD-10-CM

## 2015-12-23 LAB — GLUCOSE, CAPILLARY
GLUCOSE-CAPILLARY: 115 mg/dL — AB (ref 65–99)
GLUCOSE-CAPILLARY: 115 mg/dL — AB (ref 65–99)
GLUCOSE-CAPILLARY: 120 mg/dL — AB (ref 65–99)
GLUCOSE-CAPILLARY: 123 mg/dL — AB (ref 65–99)
GLUCOSE-CAPILLARY: 125 mg/dL — AB (ref 65–99)
GLUCOSE-CAPILLARY: 131 mg/dL — AB (ref 65–99)
GLUCOSE-CAPILLARY: 138 mg/dL — AB (ref 65–99)
GLUCOSE-CAPILLARY: 143 mg/dL — AB (ref 65–99)
GLUCOSE-CAPILLARY: 150 mg/dL — AB (ref 65–99)
GLUCOSE-CAPILLARY: 165 mg/dL — AB (ref 65–99)
Glucose-Capillary: 101 mg/dL — ABNORMAL HIGH (ref 65–99)
Glucose-Capillary: 121 mg/dL — ABNORMAL HIGH (ref 65–99)
Glucose-Capillary: 116 mg/dL — ABNORMAL HIGH (ref 65–99)
Glucose-Capillary: 100 mg/dL — ABNORMAL HIGH (ref 65–99)
Glucose-Capillary: 109 mg/dL — ABNORMAL HIGH (ref 65–99)
Glucose-Capillary: 121 mg/dL — ABNORMAL HIGH (ref 65–99)
Glucose-Capillary: 113 mg/dL — ABNORMAL HIGH (ref 65–99)
Glucose-Capillary: 102 mg/dL — ABNORMAL HIGH (ref 65–99)
Glucose-Capillary: 153 mg/dL — ABNORMAL HIGH (ref 65–99)
Glucose-Capillary: 138 mg/dL — ABNORMAL HIGH (ref 65–99)
Glucose-Capillary: 86 mg/dL (ref 65–99)
Glucose-Capillary: 140 mg/dL — ABNORMAL HIGH (ref 65–99)

## 2015-12-23 LAB — CREATININE, SERUM
CREATININE: 0.47 mg/dL (ref 0.44–1.00)
GFR calc non Af Amer: >60 mL/min (ref >60)

## 2015-12-23 LAB — POCT I-STAT 3, ART BLOOD GAS (G3+)
Acid-Base Excess: 3 mmol/L — ABNORMAL HIGH (ref 0.0–2.0)
Acid-Base Excess: 2 mmol/L (ref 0.0–2.0)
BICARBONATE: 26.8 meq/L — AB (ref 20.0–24.0)
Bicarbonate: 27 mEq/L — ABNORMAL HIGH (ref 20.0–24.0)
O2 SAT: 99 %
O2 Saturation: 96 %
PCO2 ART: 41.2 mmHg (ref 35.0–45.0)
PH ART: 7.414 (ref 7.350–7.450)
PO2 ART: 161 mmHg — AB (ref 80.0–100.0)
PO2 ART: 81 mmHg (ref 80.0–100.0)
Patient temperature: 37.4
TCO2: 28 mmol/L (ref 0–100)
TCO2: 28 mmol/L (ref 0–100)
pCO2 arterial: 41.6 mmHg (ref 35.0–45.0)
pH, Arterial: 7.427 (ref 7.350–7.450)

## 2015-12-23 LAB — CBC
HCT: 25.8 % — ABNORMAL LOW (ref 36.0–46.0)
HCT: 20.3 % — ABNORMAL LOW (ref 36.0–46.0)
HEMOGLOBIN: 6.8 g/dL — AB (ref 12.0–15.0)
Hemoglobin: 8.5 g/dL — ABNORMAL LOW (ref 12.0–15.0)
MCH: 28.1 pg (ref 26.0–34.0)
MCH: 28.6 pg (ref 26.0–34.0)
MCHC: 32.9 g/dL (ref 30.0–36.0)
MCHC: 33.5 g/dL (ref 30.0–36.0)
MCV: 85.1 fL (ref 78.0–100.0)
MCV: 85.3 fL (ref 78.0–100.0)
PLATELETS: 227 10*3/uL (ref 150–400)
PLATELETS: 266 10*3/uL (ref 150–400)
RBC: 3.03 MIL/uL — ABNORMAL LOW (ref 3.87–5.11)
RBC: 2.38 MIL/uL — AB (ref 3.87–5.11)
RDW: 15.7 % — AB (ref 11.5–15.5)
RDW: 15.8 % — ABNORMAL HIGH (ref 11.5–15.5)
WBC: 20.1 10*3/uL — ABNORMAL HIGH (ref 4.0–10.5)
WBC: 15.9 10*3/uL — AB (ref 4.0–10.5)

## 2015-12-23 LAB — BASIC METABOLIC PANEL
Anion gap: 9 (ref 5–15)
BUN: 15 mg/dL (ref 6–20)
CO2: 25 mmol/L (ref 22–32)
Calcium: 7.8 mg/dL — ABNORMAL LOW (ref 8.9–10.3)
Chloride: 103 mmol/L (ref 101–111)
Creatinine, Ser: 0.39 mg/dL — ABNORMAL LOW (ref 0.44–1.00)
GFR calc Af Amer: >60 mL/min (ref >60)
GLUCOSE: 119 mg/dL — AB (ref 65–99)
POTASSIUM: 4.5 mmol/L (ref 3.5–5.1)
SODIUM: 137 mmol/L (ref 135–145)

## 2015-12-23 LAB — POCT I-STAT 4, (NA,K, GLUC, HGB,HCT)
Glucose, Bld: 140 mg/dL — ABNORMAL HIGH (ref 65–99)
HEMATOCRIT: 33 % — AB (ref 36.0–46.0)
Hemoglobin: 11.2 g/dL — ABNORMAL LOW (ref 12.0–15.0)
Potassium: 4.6 mmol/L (ref 3.5–5.1)
Sodium: 135 mmol/L (ref 135–145)

## 2015-12-23 LAB — POCT I-STAT, CHEM 8
BUN: 15 mg/dL (ref 6–20)
CALCIUM ION: 1.17 mmol/L (ref 1.12–1.23)
Chloride: 97 mmol/L — ABNORMAL LOW (ref 101–111)
Creatinine, Ser: 0.4 mg/dL — ABNORMAL LOW (ref 0.44–1.00)
GLUCOSE: 136 mg/dL — AB (ref 65–99)
HCT: 24 % — ABNORMAL LOW (ref 36.0–46.0)
Hemoglobin: 8.2 g/dL — ABNORMAL LOW (ref 12.0–15.0)
Potassium: 4.8 mmol/L (ref 3.5–5.1)
SODIUM: 130 mmol/L — AB (ref 135–145)
TCO2: 24 mmol/L (ref 0–100)

## 2015-12-23 LAB — CULTURE, BLOOD (ROUTINE X 2)
CULTURE: NO GROWTH
CULTURE: NO GROWTH

## 2015-12-23 LAB — MAGNESIUM
Magnesium: 3.2 mg/dL — ABNORMAL HIGH (ref 1.7–2.4)
Magnesium: 1.9 mg/dL (ref 1.7–2.4)

## 2015-12-23 MED ORDER — ANTISEPTIC ORAL RINSE SOLUTION (CORINZ)
7.0000 mL | Freq: Four times a day (QID) | OROMUCOSAL | Status: DC
  Administered 2015-12-23: 7 mL via OROMUCOSAL

## 2015-12-23 MED ORDER — INSULIN DETEMIR 100 UNIT/ML ~~LOC~~ SOLN
15.0000 [IU] | Freq: Every day | SUBCUTANEOUS | Status: DC
  Filled 2015-12-23: qty 0.15

## 2015-12-23 MED ORDER — INSULIN DETEMIR 100 UNIT/ML ~~LOC~~ SOLN
15.0000 [IU] | Freq: Once | SUBCUTANEOUS | Status: AC
  Administered 2015-12-23: 15 [IU] via SUBCUTANEOUS
  Filled 2015-12-23: qty 0.15

## 2015-12-23 MED ORDER — CETYLPYRIDINIUM CHLORIDE 0.05 % MT LIQD
7.0000 mL | Freq: Two times a day (BID) | OROMUCOSAL | Status: DC
  Administered 2015-12-23 – 2016-01-05 (×19): 7 mL via OROMUCOSAL

## 2015-12-23 MED ORDER — HYDROMORPHONE HCL 1 MG/ML IJ SOLN
2.0000 mg | INTRAMUSCULAR | Status: DC | PRN
  Administered 2015-12-23 – 2015-12-25 (×13): 4 mg via INTRAVENOUS
  Administered 2015-12-25 (×2): 2 mg via INTRAVENOUS
  Filled 2015-12-23 (×2): qty 4
  Filled 2015-12-23 (×2): qty 2
  Filled 2015-12-23 (×4): qty 4
  Filled 2015-12-23: qty 2
  Filled 2015-12-23 (×3): qty 4
  Filled 2015-12-23 (×2): qty 2
  Filled 2015-12-23 (×2): qty 4
  Filled 2015-12-23: qty 2

## 2015-12-23 MED ORDER — CHLORHEXIDINE GLUCONATE 0.12% ORAL RINSE (MEDLINE KIT)
15.0000 mL | Freq: Two times a day (BID) | OROMUCOSAL | Status: DC
  Administered 2015-12-23: 15 mL via OROMUCOSAL

## 2015-12-23 MED ORDER — HYDROMORPHONE HCL 1 MG/ML IJ SOLN: 2.0000 mg | Freq: Once | INTRAMUSCULAR | Status: DC

## 2015-12-23 MED ORDER — INSULIN ASPART 100 UNIT/ML ~~LOC~~ SOLN
0.0000 [IU] | SUBCUTANEOUS | Status: DC
  Administered 2015-12-23 – 2015-12-24 (×2): 2 [IU] via SUBCUTANEOUS

## 2015-12-23 MED FILL — Dexmedetomidine HCl in NaCl 0.9% IV Soln 400 MCG/100ML: INTRAVENOUS | Qty: 100 | Status: AC

## 2015-12-23 MED FILL — Lidocaine HCl IV Inj 20 MG/ML: INTRAVENOUS | Qty: 5 | Status: AC

## 2015-12-23 MED FILL — Mannitol IV Soln 20%: INTRAVENOUS | Qty: 500 | Status: AC

## 2015-12-23 MED FILL — Heparin Sodium (Porcine) Inj 1000 Unit/ML: INTRAMUSCULAR | Qty: 10 | Status: AC

## 2015-12-23 MED FILL — Electrolyte-R (PH 7.4) Solution: INTRAVENOUS | Qty: 5000 | Status: AC

## 2015-12-23 MED FILL — Sodium Bicarbonate IV Soln 8.4%: INTRAVENOUS | Qty: 50 | Status: AC

## 2015-12-23 MED FILL — Sodium Chloride IV Soln 0.9%: INTRAVENOUS | Qty: 2000 | Status: AC

## 2015-12-23 NOTE — Op Note (Signed)
NAMEBRAILEIGH, Andrade NO.:  [id number]  MEDICAL RECORD NO.:  [id number]  LOCATION:  2S02C                        FACILITY:  [facility]  PHYSICIAN:  Salvatore Decent. 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  PROCEDURE:  Median sternotomy, extracorporeal circulation, tricuspid valve replacement with 31-mm [facility] Ease pericardial valve(model #[id number] TFX, serial [id number]) and closure of patent foramen ovale.  SURGEON:  Salvatore Decent. Dorris Fetch, M.D.  ASSISTANT:  Doree Fudge, PA.  ANESTHESIA:  General.  FINDINGS:  Transesophageal echocardiography revealed severe tricuspid regurgitation with a large vegetation on the anterior leaflet. Patent foramen ovale not well-demonstrated, no abnormality of mitral, aortic or pulmonic valves.   Intraoperative findings revealed anterior leaflet of tricuspid valve completely replaced with a large vegetation 2.5 cm in size involving the subvalvular apparatus.  No vegetations on septal or posterior leaflets. Small PFO.  Postbypass transesophageal echocardiography revealed good function of the prosthetic valve with no perivalvular leak.  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  She had severe tricuspid regurgitation and evidence of septic pulmonary emboli.  She was advised to undergo a tricuspid valve repair or replacement depending on intraoperative findings.  The indications, risks, benefits, and alternatives were discussed in detail with the patient.   She understood and accepted the risks and agreed to proceed.  OPERATIVE NOTE:  Madeline Andrade was brought to the preoperative holding area on December 22, 2015.  Anesthesia placed an arterial blood pressure monitoring line and established central venous access.  A Swan-Ganz catheter was not placed due to the patient's normal ventricular function and risk of embolization.  She was taken to the operating room, anesthetized, and intubated.  A Foley catheter was placed.  The chest, abdomen, and legs were prepped and draped in the usual sterile fashion. Transesophageal echocardiography was performed by Dr. Kipp Brood. Please see his separately dictated note for full details.  Findings were as noted above.  A median sternotomy was performed.  Hemostasis was achieved.  A retractor was placed.  The patient was fully heparinized.  The pericardium was opened.  There was cardiomegaly with an enlarged right atrium and right ventricle.  After confirming adequate anticoagulation with ACT measurement, the aorta was cannulated via concentric 2-0 pledgeted pursestring sutures.  A 24-French right-angle metal tip venous cannula was placed in the superior vena cava.  Cardiopulmonary bypass was initiated.  A 36-French malleable cannula was placed into the inferior vena cava via a pursestring suture in the inferior aspect of the right atrium.  This was connected to the bypass circuit and flows were maintained per protocol.  The patient's temperature was allowed to drift.  A retrograde cardioplegia cannula was placed via a pursestring suture in the right atrium and directed into the coronary sinus.  An antegrade  cardioplegia cannula was placed in the ascending aorta. Carbon dioxide was insufflated into the operative field.  The aorta was cross-clamped.  The left ventricle was emptied via the aortic root vent.  Cardiac arrest then was achieved with a combination of cold antegrade and retrograde blood cardioplegia  and topical iced saline.  1 L of cardioplegia was administered.  There was a rapid diastolic arrest and septal cooling to 9 degrees Celsius. A right atriotomy was performed.  The tricuspid valve was inspected.  There was a large vegetation on the anterior leaflet.  The patent foramen ovale was identified and closed with a 4-0 Prolene suture.  The PFO was small. Attention was turned back to the tricuspid valve.  The septal and posterior leaflets were essentially normal.  The anterior leaflet was essentially completely replaced by a large vegetation about 2.5 cm in diameter.  The vegetation was debrided, part of it was sent for culture but the majority was sent for pathology.  There was involvement of the subvalvular apparatus even into the tips of the papillary muscles.  All the grossly infected tissue was debrided.  There was no way to reconstruct the valve.  The anulus sized for a 31-mm [employer] Ease pericardial valve.  The patient was not a candidate for mechanical valve due to the need for lifelong anticoagulation, and the patient's noncompliance.  2-0 Ethibond horizontal mattress sutures with supra- annular pledgets were placed circumferentially around the annulus.  The septal and posterior leaflets were preserved .  The valve was prepared per manufacturer's recommendations.  The sutures then were placed through the sewing ring of the valve which was lowered into place and the sutures sequentially tied.  Additional cardioplegia was administered via the retrograde cannula at 20-minute intervals.  A vent was placed across the valve into the right ventricle.  The right atriotomy was closed in 2 layers with a running horizontal mattress suture followed by a running simple suture.  As the closure was completed, the caval tapes were released and the right heart was allowed to fill with blood.  Tommi RumpsDe- airing was performed.  The vent was removed and the sutures were tied. Lidocaine was  administered.  The aortic cross-clamp was removed.  Total cross-clamp time was 64 minutes.  While rewarming was completed, the patient spontaneously resumed a junctional rhythm.  There were P waves but they did not conduct.  The retrograde cardioplegia cannula was removed as was the antegrade cardioplegia cannula.  The superior vena cava cannula was redirected into the right atrium.  The inferior vena cava cannula was removed. Epicardial pacing wires were placed on the right ventricle and right atrium.  When the patient had rewarmed to a core temperature of 37 degrees Celsius, she was weaned from cardiopulmonary bypass on the first attempt.  The total bypass time was 103 minutes.  The initial postbypass transesophageal echocardiography showed no perivalvular leaks, good function of the prosthetic valve and good ventricular function.  A test dose of protamine was administered and was well tolerated.  The superior vena caval and aortic cannulae were removed.  The remainder of the protamine was administered without incident.  The chest was irrigated with warm saline.  Hemostasis was achieved.  A 28-French Blake drain was placed along the diaphragmatic surface of the pericardium.  A 36-French chest tube was placed in the anterior mediastinum.  The pericardium was reapproximated with interrupted 3-0 silk sutures.  It came together easily without tension.  Both pleural spaces were opened but no  significant pleural effusion was encountered.  The sternum was closed with a combination of single and double heavy gauge stainless steel wires.  The pectoralis fascia, subcutaneous tissue, and skin were closed in standard fashion.  The patient remained hemodynamically stable throughout the postbypass.  She was taken from the operating room to the Surgical Intensive Care Unit intubated and in good condition.     Salvatore Decent Dorris Fetch, M.D.     SCH/MEDQ  D:  12/22/2015  T:  12/23/2015  Job:   [id number]

## 2015-12-23 NOTE — Progress Notes (Signed)
      [address]       [place] [id number]             [phone]      C/o pain despite frequent dilaudid and oxycodone  BP 127/59 mmHg  Pulse 68  Temp(Src) 97.6 F (36.4 C) (Oral)  Resp 35  Ht 5\' 7"  (1.702 m)  Wt 122 lb 5.7 oz (55.5 kg)  BMI 19.16 kg/m2  SpO2 100%  LMP  (LMP Unknown)   Intake/Output Summary (Last 24 hours) at 12/23/15 1951 Last data filed at 12/23/15 1900  Gross per 24 hour  Intake 2664.57 ml  Output   1635 ml  Net 1029.57 ml    Hct 20.3 on PM labs, 24 on repeat  Hughey Rittenberry C. Dorris FetchHendrickson, MD [facility] [phone]
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
## 2015-12-23 NOTE — Procedures (Signed)
Extubation Procedure Note  Patient Details:   Name: Consuela MimesLori Kaye Sottile DOB: 09/11/1990 MRN: [id number]   Airway Documentation:  Airway 8 mm (Active)  Secured at (cm) 21 cm 12/23/2015  4:47 AM  Measured From Lips 12/23/2015  4:47 AM  Secured Location Left 12/23/2015  4:47 AM  Secured By [employer] Tube Holder 12/23/2015  4:47 AM  Tube Holder Repositioned Yes 12/23/2015  4:47 AM  Site Condition Dry 12/23/2015  4:47 AM    Evaluation  O2 sats: stable throughout Complications: No apparent complications Patient did tolerate procedure well. Bilateral Breath Sounds: Diminished, Rhonchi   Yes   Weaning mechanics perform with good effort. NIF(-24) & VC (0.8) Pt has positive air leak and great ABG.  Confirmed with RN, pt ready for extubation.   Marjory SneddonMackey, Jireh Elmore M 12/23/2015, 6:21 AM

## 2015-12-23 NOTE — Progress Notes (Signed)
CRITICAL VALUE ALERT  Critical value received:  hemoglobin 6.8  Date of notification:  12/23/2015   Time of notification:  1738  Critical value read back:  Yes   Nurse who received alert:  Delanna AhmadiJenifer Aislynn Cifelli   MD notified (1st page):  Dr. Dorris FetchHendrickson  Time of first page:  1740   MD notified (2nd page):  Time of second page:  Responding MD:  Dorris FetchHendrickson  Time MD responded:  [phone]

## 2015-12-23 NOTE — Progress Notes (Signed)
Cardiac weaning protocol began.

## 2015-12-23 NOTE — Progress Notes (Signed)
Anesthesiology Follow-up:  Awake and alert, having incisional pain with deep breathing, hemodynamically stable with a narrow complex junctional rhythm.   VS: T- 36.7 BP- 124/78 HR 90 RR- 28 O2 Sat 100% on 1L  K-4.8 BUN/Cr.- 15/0.40 glucose -136 H/H- 8.2/24 Platelets- 266,000  Extubated at 06:20 today (12 hours post-op)  25 year old female one day S/P tricuspid valve replacement for endocarditis. Still in junctional rhythm with narrow complex. Stable post-op course, treatment of IV drug abuse is of paramount importance for long term prognosis.  Kipp Broodavid Juniper Andrade

## 2015-12-23 NOTE — Progress Notes (Signed)
1 Day Post-Op Procedure(s) (LRB): TRICUSPID VALVE Replacement (N/A) TRANSESOPHAGEAL ECHOCARDIOGRAM (TEE) (N/A) Subjective: C/o pain Upset because pain medication changed postop  Objective: Vital signs in last 24 hours: Temp:  [97.3 F (36.3 C)-99.9 F (37.7 C)] 98.8 F (37.1 C) (04/13 0700) Pulse Rate:  [80-112] 86 (04/13 0700) Cardiac Rhythm:  [-] A-V Sequential paced (04/13 0400) Resp:  [12-43] 36 (04/13 0700) BP: (78-121)/(35-93) 106/58 mmHg (04/13 0700) SpO2:  [99 %-100 %] 99 % (04/13 0700) Arterial Line BP: (90-133)/(39-80) 122/54 mmHg (04/13 0700) FiO2 (%):  [2 %-50 %] 40 % (04/13 0447) Weight:  [122 lb 5.7 oz (55.5 kg)] 122 lb 5.7 oz (55.5 kg) (04/13 0500)  Hemodynamic parameters for last 24 hours: CVP:  [4 mmHg-27 mmHg] 10 mmHg  Intake/Output from previous day: 04/12 0701 - 04/13 0700 In: 5233.6 [I.V.:3041.1; Blood:1042.5; IV Piggyback:1150] Out: 5045 [Urine:2920; Blood:1575; Chest Tube:550] Intake/Output this shift:    General appearance: alert and mild distress Neurologic: intact Heart: regular rate and rhythm and paced Lungs: diminished breath sounds bibasilar Abdomen: normal findings: soft, non-tender  Lab Results:  Recent Labs  12/22/15 1935 12/22/15 1939 12/23/15 0300  WBC 36.1*  --  20.1*  HGB 10.6* 11.2* 8.5*  HCT 33.3* 33.0* 25.8*  PLT 211  --  227   BMET:  Recent Labs  12/22/15 0520  12/22/15 1825 12/22/15 1939 12/23/15 0300  NA 139  < > 134* 135 137  K 4.1  < > 4.6 4.6 4.5  CL 102  < > 96*  --  103  CO2 27  --   --   --  25  GLUCOSE 77  < > 206* 140* 119*  BUN 8  < > 9  --  15  CREATININE 0.34*  < > <0.20*  --  0.39*  CALCIUM 8.1*  --   --   --  7.8*  < > = values in this interval not displayed.  PT/INR:  Recent Labs  12/22/15 1935  LABPROT 18.1*  INR 1.49   ABG    Component Value Date/Time   PHART 7.414 12/23/2015 0719   HCO3 26.8* 12/23/2015 0719   TCO2 28 12/23/2015 0719   ACIDBASEDEF 0.8 12/01/2015 0738   O2SAT  96.0 12/23/2015 0719   CBG (last 3)   Recent Labs  12/23/15 0504 12/23/15 0603 12/23/15 0707  GLUCAP 100* 109* 121*    Assessment/Plan: S/P Procedure(s) (LRB): TRICUSPID VALVE Replacement (N/A) TRANSESOPHAGEAL ECHOCARDIOGRAM (TEE) (N/A) -  CV- AV block with junctional escape- continue pacing today, stop metoprolol  RESP- IS for atelectasis  RENAL- creatinine and lytes OK  ENDO- CBG well controlled- change to q4 SSI  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

## 2015-12-23 NOTE — Addendum Note (Signed)
Addendum  created 12/23/15 1832 by Kipp Broodavid Kierstynn Babich, MD   Modules edited: Notes Section   Notes Section:  File: [id number]; Pend: [id number]; Beverely LowPend: [id number]; Beverely LowPend: [id number]; Beverely LowPend: [id number]; Pend: [id number]

## 2015-12-23 NOTE — Progress Notes (Signed)
1330 Dr Dorris FetchHendrickson notified of saturated chest tube dressing with bright red blood.  Bright red clots noted throughout tubing.   1345 Dr Dorris FetchHendrickson at bedside.  CT dsg d/c'd by Dorris FetchHendrickson with large bright red clots noted under dressing with large amounts of bright red oozing.  CTs d/c'd with large clot noted at tip.  Pressure applied.  Pt on bedrest.  Will continue to monitor.

## 2015-12-24 ENCOUNTER — Inpatient Hospital Stay (HOSPITAL_COMMUNITY): Payer: Self-pay

## 2015-12-24 DIAGNOSIS — B958 Unspecified staphylococcus as the cause of diseases classified elsewhere: Secondary | ICD-10-CM

## 2015-12-24 DIAGNOSIS — Z952 Presence of prosthetic heart valve: Secondary | ICD-10-CM

## 2015-12-24 DIAGNOSIS — F604 Histrionic personality disorder: Secondary | ICD-10-CM | POA: Insufficient documentation

## 2015-12-24 DIAGNOSIS — J9811 Atelectasis: Secondary | ICD-10-CM | POA: Insufficient documentation

## 2015-12-24 DIAGNOSIS — F603 Borderline personality disorder: Secondary | ICD-10-CM | POA: Insufficient documentation

## 2015-12-24 DIAGNOSIS — F19288 Other psychoactive substance dependence with other psychoactive substance-induced disorder: Secondary | ICD-10-CM

## 2015-12-24 DIAGNOSIS — I442 Atrioventricular block, complete: Secondary | ICD-10-CM

## 2015-12-24 LAB — TYPE AND SCREEN
ABO/RH(D): O POS
ANTIBODY SCREEN: NEGATIVE
UNIT DIVISION: 0
UNIT DIVISION: 0
UNIT DIVISION: 0
UNIT DIVISION: 0
UNIT DIVISION: 0
Unit division: 0
Unit division: 0
Unit division: 0
Unit division: 0

## 2015-12-24 LAB — CBC
HEMATOCRIT: 17.3 % — AB (ref 36.0–46.0)
HEMATOCRIT: 25.5 % — AB (ref 36.0–46.0)
Hemoglobin: 5.7 g/dL — CL (ref 12.0–15.0)
Hemoglobin: 8.4 g/dL — ABNORMAL LOW (ref 12.0–15.0)
MCH: 28.1 pg (ref 26.0–34.0)
MCH: 27.5 pg (ref 26.0–34.0)
MCHC: 32.9 g/dL (ref 30.0–36.0)
MCHC: 32.9 g/dL (ref 30.0–36.0)
MCV: 85.2 fL (ref 78.0–100.0)
MCV: 83.6 fL (ref 78.0–100.0)
PLATELETS: 252 10*3/uL (ref 150–400)
Platelets: 245 10*3/uL (ref 150–400)
RBC: 2.03 MIL/uL — ABNORMAL LOW (ref 3.87–5.11)
RBC: 3.05 MIL/uL — ABNORMAL LOW (ref 3.87–5.11)
RDW: 15.5 % (ref 11.5–15.5)
RDW: 15.1 % (ref 11.5–15.5)
WBC: 13.1 10*3/uL — ABNORMAL HIGH (ref 4.0–10.5)
WBC: 14.7 10*3/uL — AB (ref 4.0–10.5)

## 2015-12-24 LAB — GLUCOSE, CAPILLARY
GLUCOSE-CAPILLARY: 119 mg/dL — AB (ref 65–99)
Glucose-Capillary: 92 mg/dL (ref 65–99)
Glucose-Capillary: 104 mg/dL — ABNORMAL HIGH (ref 65–99)
Glucose-Capillary: 122 mg/dL — ABNORMAL HIGH (ref 65–99)
Glucose-Capillary: 102 mg/dL — ABNORMAL HIGH (ref 65–99)

## 2015-12-24 LAB — BASIC METABOLIC PANEL
Anion gap: 11 (ref 5–15)
BUN: 20 mg/dL (ref 6–20)
CALCIUM: 7.9 mg/dL — AB (ref 8.9–10.3)
CO2: 23 mmol/L (ref 22–32)
Chloride: 97 mmol/L — ABNORMAL LOW (ref 101–111)
Creatinine, Ser: 0.69 mg/dL (ref 0.44–1.00)
GFR calc Af Amer: >60 mL/min (ref >60)
GLUCOSE: 100 mg/dL — AB (ref 65–99)
POTASSIUM: 4.8 mmol/L (ref 3.5–5.1)
SODIUM: 131 mmol/L — AB (ref 135–145)

## 2015-12-24 LAB — PREPARE RBC (CROSSMATCH)

## 2015-12-24 MED ORDER — INSULIN ASPART 100 UNIT/ML ~~LOC~~ SOLN: 0.0000 [IU] | Freq: Three times a day (TID) | SUBCUTANEOUS | Status: DC

## 2015-12-24 MED ORDER — FUROSEMIDE 10 MG/ML IJ SOLN
20.0000 mg | Freq: Once | INTRAMUSCULAR | Status: AC
  Administered 2015-12-24: 20 mg via INTRAVENOUS
  Filled 2015-12-24: qty 2

## 2015-12-24 MED ORDER — SODIUM CHLORIDE 0.9 % IV SOLN: Freq: Once | INTRAVENOUS | Status: DC

## 2015-12-24 NOTE — Progress Notes (Signed)
Patient ID: Madeline Andrade, female   DOB: 04/05/1991, 25 y.o.   MRN: [id number] EVENING ROUNDS NOTE :     [address]       [place] [id number]             [phone]                 2 Days Post-Op Procedure(s) (LRB): TRICUSPID VALVE Replacement (N/A) TRANSESOPHAGEAL ECHOCARDIOGRAM (TEE) (N/A)  Total Length of Stay:  LOS: 11 days  BP 126/69 mmHg  Pulse 71  Temp(Src) 97.8 F (36.6 C) (Oral)  Resp 27  Ht 5\' 7"  (1.702 m)  Wt 127 lb 6.8 oz (57.8 kg)  BMI 19.95 kg/m2  SpO2 97%  LMP  (LMP Unknown)  .Intake/Output      04/13 0701 - 04/14 0700 04/14 0701 - 04/15 0700   P.O.     I.V. (mL/kg) 783.5 (13.6) 380 (6.6)   Blood  1317   IV Piggyback 300 100   Total Intake(mL/kg) 1083.5 (18.7) 1797 (31.1)   Urine (mL/kg/hr) 635 (0.5) 895 (1.7)   Blood     Chest Tube 250 (0.2)    Total Output 885 895   Net +198.5 +902          . sodium chloride Stopped (12/23/15 1200)  . sodium chloride    . sodium chloride Stopped (12/24/15 1500)  . dexmedetomidine Stopped (12/23/15 0654)  . lactated ringers Stopped (12/24/15 1500)  . lactated ringers Stopped (12/24/15 1500)  . nitroGLYCERIN Stopped (12/22/15 2025)  . phenylephrine (NEO-SYNEPHRINE) Adult infusion Stopped (12/23/15 1000)     Lab Results  Component Value Date   WBC 13.1* 12/24/2015   HGB 5.7* 12/24/2015   HCT 17.3* 12/24/2015   PLT 245 12/24/2015   GLUCOSE 100* 12/24/2015   ALT 42 12/22/2015   AST 71* 12/22/2015   NA 131* 12/24/2015   K 4.8 12/24/2015   CL 97* 12/24/2015   CREATININE 0.69 12/24/2015   BUN 20 12/24/2015   CO2 23 12/24/2015   INR 1.49 12/22/2015   Two units of blood given today, cbc pending Has not walked yet  Delight OvensEdward B Jodean Valade MD  Beeper [phone] Office [phone] 12/24/2015 4:14 PM
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
## 2015-12-24 NOTE — Care Management Note (Signed)
Case Management Note  Patient Details  Name: Madeline Andrade MRN: [id number] Date of Birth: 03/25/1991  Subjective/Objective:     Pt will need SNF stay to complete IV therapy.  Discussed with pt, her mother, and her grandmother.  Pt repeatedly states that she wants to discharge home to her grandmother's home to complete the IV antibx.  CM and pt's family continue to reiterate that is not an option.  CSW consult entered.                            Expected Discharge Plan:  Skilled Nursing Facility  In-House Referral:  Clinical Social Work  Status of Service:  In process, will continue to follow  Magdalene RiverMayo, Jaccob Czaplicki T, RN 12/24/2015, 2:55 PM

## 2015-12-24 NOTE — Progress Notes (Signed)
2 Days Post-Op Procedure(s) (LRB): TRICUSPID VALVE Replacement (N/A) TRANSESOPHAGEAL ECHOCARDIOGRAM (TEE) (N/A) Subjective: Feels better this AM   Objective: Vital signs in last 24 hours: Temp:  [96.8 F (36 C)-98.6 F (37 C)] 98.6 F (37 C) (04/14 0400) Pulse Rate:  [66-110] 70 (04/14 0700) Cardiac Rhythm:  [-] Junctional rhythm (04/14 0400) Resp:  [18-36] 25 (04/14 0700) BP: (68-135)/(32-102) 129/61 mmHg (04/14 0700) SpO2:  [74 %-100 %] 100 % (04/14 0700) Arterial Line BP: (115-171)/(40-81) 143/46 mmHg (04/14 0700) Weight:  [127 lb 6.8 oz (57.8 kg)] 127 lb 6.8 oz (57.8 kg) (04/14 0500)  Hemodynamic parameters for last 24 hours: CVP:  [3 mmHg-16 mmHg] 7 mmHg  Intake/Output from previous day: 04/13 0701 - 04/14 0700 In: 1033.5 [I.V.:733.5; IV Piggyback:300] Out: 885 [Urine:635; Chest Tube:250] Intake/Output this shift:    General appearance: alert, cooperative and no distress Neurologic: intact Heart: regular rate and rhythm Lungs: diminished breath sounds bibasilar Abdomen: normal findings: soft, non-tender  Lab Results:  Recent Labs  12/23/15 1708 12/23/15 1713 12/24/15 0500  WBC 15.9*  --  13.1*  HGB 6.8* 8.2* 5.7*  HCT 20.3* 24.0* 17.3*  PLT 266  --  245   BMET:  Recent Labs  12/23/15 0300  12/23/15 1713 12/24/15 0500  NA 137  --  130* 131*  K 4.5  --  4.8 4.8  CL 103  --  97* 97*  CO2 25  --   --  23  GLUCOSE 119*  --  136* 100*  BUN 15  --  15 20  CREATININE 0.39*  < > 0.40* 0.69  CALCIUM 7.8*  --   --  7.9*  < > = values in this interval not displayed.  PT/INR:  Recent Labs  12/22/15 1935  LABPROT 18.1*  INR 1.49   ABG    Component Value Date/Time   PHART 7.414 12/23/2015 0719   HCO3 26.8* 12/23/2015 0719   TCO2 24 12/23/2015 1713   ACIDBASEDEF 0.8 12/01/2015 0738   O2SAT 96.0 12/23/2015 0719   CBG (last 3)   Recent Labs  12/23/15 1938 12/23/15 2328 12/24/15 0410  GLUCAP 150* 140* 92    Assessment/Plan: S/P Procedure(s)  (LRB): TRICUSPID VALVE Replacement (N/A) TRANSESOPHAGEAL ECHOCARDIOGRAM (TEE) (N/A) -  CV- stable, in junctional rhythm with rate of 70 - will ask Cards to see re: whether pacemaker needed  Not a good candidate for anticoagulation  RESP- CXR not yet read- may be more of an infiltrate in LUL than previous  IS  RENAL- creatinine ok, mild hypokalemia  ID- on ancef for endocarditis  ENDO- CBG ok  ABL secondary to ABL- transfuse 2 units     LOS: 11 days    Loreli SlotSteven C Namrata Dangler 12/24/2015

## 2015-12-24 NOTE — Progress Notes (Signed)
CRITICAL VALUE ALERT  Critical value received:  HGB 5.7  Date of notification:  12/24/2015  Time of notification:  0545  Critical value read back:Yes.    Nurse who received alert:  Dessa PhiShane Taylor RN  MD notified (1st page):  Dorris FetchHendrickson  Time of first page:  0600 MD notified (2nd page): NA  Time of second page: NA  Responding MD:  Dorris FetchHendrickson  Time MD responded:  0600
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
## 2015-12-25 ENCOUNTER — Inpatient Hospital Stay (HOSPITAL_COMMUNITY): Payer: Self-pay

## 2015-12-25 DIAGNOSIS — I442 Atrioventricular block, complete: Secondary | ICD-10-CM | POA: Insufficient documentation

## 2015-12-25 LAB — BASIC METABOLIC PANEL
Anion gap: 10 (ref 5–15)
BUN: 14 mg/dL (ref 6–20)
CO2: 27 mmol/L (ref 22–32)
Calcium: 8 mg/dL — ABNORMAL LOW (ref 8.9–10.3)
Chloride: 97 mmol/L — ABNORMAL LOW (ref 101–111)
Creatinine, Ser: 0.45 mg/dL (ref 0.44–1.00)
GFR calc Af Amer: >60 mL/min (ref >60)
GFR calc non Af Amer: >60 mL/min (ref >60)
Glucose, Bld: 91 mg/dL (ref 65–99)
Potassium: 4.3 mmol/L (ref 3.5–5.1)
Sodium: 134 mmol/L — ABNORMAL LOW (ref 135–145)

## 2015-12-25 LAB — TYPE AND SCREEN
ABO/RH(D): O POS
Antibody Screen: NEGATIVE
UNIT DIVISION: 0
UNIT DIVISION: 0

## 2015-12-25 LAB — GLUCOSE, CAPILLARY
GLUCOSE-CAPILLARY: 99 mg/dL (ref 65–99)
Glucose-Capillary: 90 mg/dL (ref 65–99)
Glucose-Capillary: 157 mg/dL — ABNORMAL HIGH (ref 65–99)
Glucose-Capillary: 98 mg/dL (ref 65–99)

## 2015-12-25 LAB — CBC
HCT: 24.5 % — ABNORMAL LOW (ref 36.0–46.0)
Hemoglobin: 8.3 g/dL — ABNORMAL LOW (ref 12.0–15.0)
MCH: 28.5 pg (ref 26.0–34.0)
MCHC: 33.9 g/dL (ref 30.0–36.0)
MCV: 84.2 fL (ref 78.0–100.0)
Platelets: 228 10*3/uL (ref 150–400)
RBC: 2.91 MIL/uL — ABNORMAL LOW (ref 3.87–5.11)
RDW: 15.5 % (ref 11.5–15.5)
WBC: 13.8 10*3/uL — ABNORMAL HIGH (ref 4.0–10.5)

## 2015-12-25 MED ORDER — OXYCODONE HCL 5 MG PO TABS
5.0000 mg | ORAL_TABLET | ORAL | Status: DC | PRN
  Administered 2015-12-25 – 2016-01-05 (×61): 10 mg via ORAL
  Filled 2015-12-25 (×61): qty 2

## 2015-12-25 MED ORDER — ASPIRIN EC 325 MG PO TBEC
325.0000 mg | DELAYED_RELEASE_TABLET | Freq: Every day | ORAL | Status: DC
  Administered 2015-12-26 – 2016-01-05 (×11): 325 mg via ORAL
  Filled 2015-12-25 (×12): qty 1

## 2015-12-25 MED ORDER — PANTOPRAZOLE SODIUM 40 MG PO TBEC
40.0000 mg | DELAYED_RELEASE_TABLET | Freq: Every day | ORAL | Status: DC
  Administered 2015-12-26 – 2016-01-05 (×11): 40 mg via ORAL
  Filled 2015-12-25 (×11): qty 1

## 2015-12-25 MED ORDER — FUROSEMIDE 20 MG PO TABS
20.0000 mg | ORAL_TABLET | Freq: Every day | ORAL | Status: AC
  Administered 2015-12-26 – 2015-12-27 (×2): 20 mg via ORAL
  Filled 2015-12-25 (×2): qty 1

## 2015-12-25 MED ORDER — SODIUM CHLORIDE 0.9 % IV SOLN
250.0000 mL | INTRAVENOUS | Status: DC | PRN
Start: 2015-12-25 — End: 2016-01-05

## 2015-12-25 MED ORDER — ONDANSETRON HCL 4 MG/2ML IJ SOLN: 4.0000 mg | Freq: Four times a day (QID) | INTRAMUSCULAR | Status: DC | PRN

## 2015-12-25 MED ORDER — SODIUM CHLORIDE 0.9% FLUSH
3.0000 mL | Freq: Two times a day (BID) | INTRAVENOUS | Status: DC
  Administered 2015-12-26 – 2016-01-04 (×17): 3 mL via INTRAVENOUS

## 2015-12-25 MED ORDER — SODIUM CHLORIDE 0.9% FLUSH
3.0000 mL | INTRAVENOUS | Status: DC | PRN
  Administered 2016-01-01: 3 mL via INTRAVENOUS
  Filled 2015-12-25: qty 3

## 2015-12-25 MED ORDER — TRAMADOL HCL 50 MG PO TABS
50.0000 mg | ORAL_TABLET | ORAL | Status: DC | PRN
  Administered 2015-12-26 – 2016-01-05 (×15): 100 mg via ORAL
  Filled 2015-12-25 (×15): qty 2

## 2015-12-25 MED ORDER — INSULIN ASPART 100 UNIT/ML ~~LOC~~ SOLN
0.0000 [IU] | Freq: Three times a day (TID) | SUBCUTANEOUS | Status: DC
  Administered 2015-12-27 – 2015-12-29 (×2): 2 [IU] via SUBCUTANEOUS
  Administered 2015-12-29: 4 [IU] via SUBCUTANEOUS

## 2015-12-25 MED ORDER — MOVING RIGHT ALONG BOOK
Freq: Once | Status: AC
  Administered 2015-12-25: 16:00:00
  Filled 2015-12-25: qty 1

## 2015-12-25 MED ORDER — DOCUSATE SODIUM 100 MG PO CAPS
200.0000 mg | ORAL_CAPSULE | Freq: Every day | ORAL | Status: DC
  Administered 2015-12-26 – 2016-01-04 (×7): 200 mg via ORAL
  Filled 2015-12-25 (×12): qty 2

## 2015-12-25 MED ORDER — ZOLPIDEM TARTRATE 5 MG PO TABS
5.0000 mg | ORAL_TABLET | Freq: Every evening | ORAL | Status: DC | PRN
  Administered 2015-12-25 – 2016-01-04 (×11): 5 mg via ORAL
  Filled 2015-12-25 (×12): qty 1

## 2015-12-25 MED ORDER — BISACODYL 5 MG PO TBEC
10.0000 mg | DELAYED_RELEASE_TABLET | Freq: Every day | ORAL | Status: DC | PRN
  Filled 2015-12-25: qty 2

## 2015-12-25 MED ORDER — BISACODYL 10 MG RE SUPP: 10.0000 mg | Freq: Every day | RECTAL | Status: DC | PRN

## 2015-12-25 MED ORDER — ONDANSETRON HCL 4 MG PO TABS: 4.0000 mg | ORAL_TABLET | Freq: Four times a day (QID) | ORAL | Status: DC | PRN

## 2015-12-25 MED ORDER — GUAIFENESIN ER 600 MG PO TB12
600.0000 mg | ORAL_TABLET | Freq: Two times a day (BID) | ORAL | Status: DC | PRN
  Administered 2015-12-26 – 2015-12-29 (×4): 600 mg via ORAL
  Filled 2015-12-25 (×5): qty 1

## 2015-12-25 NOTE — Progress Notes (Signed)
Report called to RN Fayrene FearingJames on 2W. Transferring pt to 2w20. P. Seleta RhymesAmo Nathanael Krist RN

## 2015-12-25 NOTE — Clinical Social Work Note (Signed)
CSW AD assisting with SNF placement for 6 weeks IV abx via PICC line at d/c. Currently patient is not medically stable for d/c.   CSW remains available as needed.   Derenda FennelBashira Charma Mocarski, MSW, LCSWA [phone] 12/25/2015 10:15 AM

## 2015-12-25 NOTE — Progress Notes (Signed)
Patient ID: Madeline Andrade, female   DOB: 07-Jan-1991, 25 y.o.   MRN: [id number] [facility] DAILY ICU PROGRESS NOTE                   [address]            [place] [address]          [phone]   3 Days Post-Op Procedure(s) (LRB): TRICUSPID VALVE Replacement (N/A) TRANSESOPHAGEAL ECHOCARDIOGRAM (TEE) (N/A)  Total Length of Stay:  LOS: 12 days   Subjective: Alert , walked around unit this am  Objective: Vital signs in last 24 hours: Temp:  [97.3 F (36.3 C)-98.3 F (36.8 C)] 98.3 F (36.8 C) (04/15 0432) Pulse Rate:  [64-77] 77 (04/15 0700) Cardiac Rhythm:  [-] Heart block;Junctional rhythm (04/15 0400) Resp:  [15-34] 20 (04/15 0700) BP: (116-143)/(50-79) 118/64 mmHg (04/15 0700) SpO2:  [94 %-100 %] 96 % (04/15 0700) Weight:  [126 lb 12.8 oz (57.516 kg)] 126 lb 12.8 oz (57.516 kg) (04/15 0500)  Filed Weights   12/23/15 0500 12/24/15 0500 12/25/15 0500  Weight: 122 lb 5.7 oz (55.5 kg) 127 lb 6.8 oz (57.8 kg) 126 lb 12.8 oz (57.516 kg)    Weight change: -10 oz (-0.284 kg)   Hemodynamic parameters for last 24 hours:    Intake/Output from previous day: 04/14 0701 - 04/15 0700 In: 1997 [I.V.:380; Blood:1317; IV Piggyback:300] Out: 2170 [Urine:2170]  Intake/Output this shift:    Current Meds: Scheduled Meds: . sodium chloride   Intravenous Once  . acetaminophen  1,000 mg Oral 4 times per day   Or  . acetaminophen (TYLENOL) oral liquid 160 mg/5 mL  1,000 mg Per Tube 4 times per day  . antiseptic oral rinse  7 mL Mouth Rinse BID  . aspirin EC  325 mg Oral Daily   Or  . aspirin  324 mg Per Tube Daily  . bisacodyl  10 mg Oral Daily   Or  . bisacodyl  10 mg Rectal Daily  . busPIRone  7.5 mg Oral TID  .  ceFAZolin (ANCEF) IV  2 g Intravenous 3 times per day  . docusate sodium  200 mg Oral Daily  .  HYDROmorphone (DILAUDID) injection  2 mg Intravenous Once  . insulin aspart  0-9 Units Subcutaneous TID WC  . multivitamin with minerals  1 tablet Oral  Daily  . naloxegol oxalate  12.5 mg Oral Daily  . nicotine  21 mg Transdermal Daily  . oxyCODONE  20 mg Oral Q12H  . pantoprazole  40 mg Oral Daily  . sodium chloride flush  3 mL Intravenous Q12H  . thiamine  100 mg Oral Daily   Continuous Infusions: . sodium chloride Stopped (12/23/15 1200)  . sodium chloride    . sodium chloride Stopped (12/24/15 1500)  . dexmedetomidine Stopped (12/23/15 0654)  . lactated ringers Stopped (12/24/15 1500)  . lactated ringers Stopped (12/24/15 1500)  . nitroGLYCERIN Stopped (12/22/15 2025)  . phenylephrine (NEO-SYNEPHRINE) Adult infusion Stopped (12/23/15 1000)   PRN Meds:.sodium chloride, HYDROmorphone (DILAUDID) injection, lactated ringers, metoprolol, midazolam, naLOXone (NARCAN)  injection, ondansetron (ZOFRAN) IV, oxyCODONE, sodium chloride flush, traMADol  General appearance: alert and cooperative Neurologic: intact Heart: regular rate and rhythm, S1, S2 normal, no murmur, click, rub or gallop Lungs: diminished breath sounds bilaterally Abdomen: soft, non-tender; bowel sounds normal; no masses,  no organomegaly Extremities: extremities normal, atraumatic, no cyanosis or edema and Homans sign is negative, no sign of DVT Wound: sternum  stable  Lab Results: CBC: Recent Labs  12/24/15 1633 12/25/15 0346  WBC 14.7* 13.8*  HGB 8.4* 8.3*  HCT 25.5* 24.5*  PLT 252 228   BMET:  Recent Labs  12/24/15 0500 12/25/15 0346  NA 131* 134*  K 4.8 4.3  CL 97* 97*  CO2 23 27  GLUCOSE 100* 91  BUN 20 14  CREATININE 0.69 0.45  CALCIUM 7.9* 8.0*    PT/INR:  Recent Labs  12/22/15 1935  LABPROT 18.1*  INR 1.49   Radiology: Dg Chest Port 1 View  12/25/2015  CLINICAL DATA:  25 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 Stable cardiac and mediastinal contours. Surgical changes of prior  tricuspid valve replacement with median sternotomy. Persistent right greater than left patchy and nodular airspace opacities most consistent with septic emboli. There are trace bilateral pleural effusions. No evidence of pneumothorax. No acute osseous abnormality. IMPRESSION: 1. Interval removal of right IJ venous catheters and sheath. 2. Stable appearance of the chest with residual septic emboli, bibasilar atelectasis and trace bilateral pleural effusions. Electronically Signed   By: Malachy MoanHeath  McCullough M.D.   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

## 2015-12-26 ENCOUNTER — Inpatient Hospital Stay (HOSPITAL_COMMUNITY): Payer: Self-pay

## 2015-12-26 LAB — BASIC METABOLIC PANEL
Anion gap: 10 (ref 5–15)
BUN: 9 mg/dL (ref 6–20)
CO2: 27 mmol/L (ref 22–32)
Calcium: 8 mg/dL — ABNORMAL LOW (ref 8.9–10.3)
Chloride: 96 mmol/L — ABNORMAL LOW (ref 101–111)
Creatinine, Ser: 0.37 mg/dL — ABNORMAL LOW (ref 0.44–1.00)
GFR calc Af Amer: >60 mL/min (ref >60)
GFR calc non Af Amer: >60 mL/min (ref >60)
Glucose, Bld: 108 mg/dL — ABNORMAL HIGH (ref 65–99)
Potassium: 4.2 mmol/L (ref 3.5–5.1)
Sodium: 133 mmol/L — ABNORMAL LOW (ref 135–145)

## 2015-12-26 LAB — TISSUE CULTURE
Culture: NO GROWTH
GRAM STAIN: NONE SEEN

## 2015-12-26 LAB — GLUCOSE, CAPILLARY
GLUCOSE-CAPILLARY: 98 mg/dL (ref 65–99)
Glucose-Capillary: 142 mg/dL — ABNORMAL HIGH (ref 65–99)
Glucose-Capillary: 93 mg/dL (ref 65–99)

## 2015-12-26 LAB — CBC
HCT: 25.2 % — ABNORMAL LOW (ref 36.0–46.0)
Hemoglobin: 8 g/dL — ABNORMAL LOW (ref 12.0–15.0)
MCH: 27.2 pg (ref 26.0–34.0)
MCHC: 31.7 g/dL (ref 30.0–36.0)
MCV: 85.7 fL (ref 78.0–100.0)
Platelets: 270 10*3/uL (ref 150–400)
RBC: 2.94 MIL/uL — ABNORMAL LOW (ref 3.87–5.11)
RDW: 15.2 % (ref 11.5–15.5)
WBC: 16.1 10*3/uL — ABNORMAL HIGH (ref 4.0–10.5)

## 2015-12-26 NOTE — Progress Notes (Signed)
Utilization review completed.  

## 2015-12-26 NOTE — Progress Notes (Addendum)
[address]       [place] [address]             [phone]      4 Days Post-Op Procedure(s) (LRB): TRICUSPID VALVE Replacement (N/A) TRANSESOPHAGEAL ECHOCARDIOGRAM (TEE) (N/A) Subjective: Feels soreness in chest  Objective: Vital signs in last 24 hours: Temp:  [97.8 F (36.6 C)-98.2 F (36.8 C)] 98.1 F (36.7 C) (04/16 0220) Pulse Rate:  [67-102] 87 (04/16 0220) Cardiac Rhythm:  [-] Junctional rhythm (04/15 1955) Resp:  [16-26] 20 (04/16 0220) BP: (99-142)/(69-96) 99/73 mmHg (04/16 0220) SpO2:  [95 %-100 %] 95 % (04/16 0220)  Hemodynamic parameters for last 24 hours:    Intake/Output from previous day: 04/15 0701 - 04/16 0700 In: 720 [P.O.:720] Out: 400 [Urine:400] Intake/Output this shift:    General appearance: alert, cooperative and no distress Heart: regular rate and rhythm and no murmur Lungs: diminished BS Abdomen: soft, non-tender Extremities: no edema Wound: incis healing well  Lab Results:  Recent Labs  12/25/15 0346 12/26/15 0408  WBC 13.8* 16.1*  HGB 8.3* 8.0*  HCT 24.5* 25.2*  PLT 228 270   BMET:  Recent Labs  12/25/15 0346 12/26/15 0408  NA 134* 133*  K 4.3 4.2  CL 97* 96*  CO2 27 27  GLUCOSE 91 108*  BUN 14 9  CREATININE 0.45 0.37*  CALCIUM 8.0* 8.0*    PT/INR: No results for input(s): LABPROT, INR in the last 72 hours. ABG    Component Value Date/Time   PHART 7.414 12/23/2015 0719   HCO3 26.8* 12/23/2015 0719   TCO2 24 12/23/2015 1713   ACIDBASEDEF 0.8 12/01/2015 0738   O2SAT 96.0 12/23/2015 0719   CBG (last 3)   Recent Labs  12/25/15 0429 12/25/15 0907 12/25/15 1235  GLUCAP 90 157* 98    Meds Scheduled Meds: . antiseptic oral rinse  7 mL Mouth Rinse BID  . aspirin EC  325 mg Oral Daily  . busPIRone  7.5 mg Oral TID  .  ceFAZolin (ANCEF) IV  2 g Intravenous 3 times per day  . docusate sodium  200 mg Oral Daily  . furosemide  20 mg Oral Daily  . insulin aspart  0-24 Units  Subcutaneous TID AC & HS  . multivitamin with minerals  1 tablet Oral Daily  . naloxegol oxalate  12.5 mg Oral Daily  . nicotine  21 mg Transdermal Daily  . oxyCODONE  20 mg Oral Q12H  . pantoprazole  40 mg Oral QAC breakfast  . sodium chloride flush  3 mL Intravenous Q12H  . 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 Stable cardiac and mediastinal contours. Surgical changes of prior tricuspid valve replacement with median sternotomy. Persistent right greater than left patchy and nodular airspace opacities most consistent with septic emboli. There are trace bilateral pleural effusions. No evidence of pneumothorax. No acute osseous abnormality. IMPRESSION: 1. Interval removal of right IJ venous catheters and sheath. 2. Stable appearance of the chest with residual septic emboli, bibasilar atelectasis and trace bilateral pleural effusions. Electronically Signed   By: Vilma Prader  Archer AsaMcCullough M.D.   On: 12/25/2015 07:59    Assessment/Plan: S/P Procedure(s) (LRB): TRICUSPID VALVE Replacement (N/A) TRANSESOPHAGEAL ECHOCARDIOGRAM (TEE) (N/A)  1 feeling better, strength improving 2 CHB persists, rate is ok, no pacer per EP  3 ancef- per ID 4 CXR appearance is worrisome but stable, no fevers, leukocytosis is increasing 5 push  pulm toilet/rehab as able 6 H/H improved a little 7 cont gentle diuresis- wt still up  LOS: 13 days    Madeline Andrade,Madeline Andrade 12/26/2015  Still bilateral pulmonary infiltrates, but some improvement  On Ancef Will need pic for  completion   of iv antibiotics I have seen and examined Madeline Andrade and agree with the above assessment  and plan.  Delight OvensEdward B Lossie Kalp MD Beeper [phone] Office [phone] 12/26/2015 11:19 AM

## 2015-12-27 ENCOUNTER — Inpatient Hospital Stay (HOSPITAL_COMMUNITY): Payer: Self-pay

## 2015-12-27 LAB — GLUCOSE, CAPILLARY
Glucose-Capillary: 121 mg/dL — ABNORMAL HIGH (ref 65–99)
Glucose-Capillary: 92 mg/dL (ref 65–99)
Glucose-Capillary: 99 mg/dL (ref 65–99)

## 2015-12-27 LAB — ANAEROBIC CULTURE: Gram Stain: NONE SEEN

## 2015-12-27 MED ORDER — SODIUM CHLORIDE 0.9% FLUSH
10.0000 mL | INTRAVENOUS | Status: DC | PRN
  Administered 2015-12-28 – 2016-01-04 (×6): 10 mL
  Filled 2015-12-27 (×6): qty 40

## 2015-12-27 NOTE — Progress Notes (Signed)
MEDICATION RELATED CONSULT NOTE - INITIAL   Pharmacy Consult for Medications that prolong the QTC  No Known Allergies  Patient Measurements: Height: 5\' 7"  (170.2 cm) Weight: 128 lb 1.6 oz (58.106 kg) IBW/kg (Calculated) : 61.6  Vital Signs: Temp: 99.1 F (37.3 C) (04/17 0638) Temp Source: Oral (04/17 16100638) BP: 123/64 mmHg (04/17 96040638) Pulse Rate: 70 (04/17 54090638) Intake/Output from previous day: 04/16 0701 - 04/17 0700 In: 360 [P.O.:360] Out: 800 [Urine:800] Intake/Output from this shift:    Labs:  Recent Labs  12/24/15 1633 12/25/15 0346 12/26/15 0408  WBC 14.7* 13.8* 16.1*  HGB 8.4* 8.3* 8.0*  HCT 25.5* 24.5* 25.2*  PLT 252 228 270  CREATININE  --  0.45 0.37*   Estimated Creatinine Clearance: 96.9 mL/min (by C-G formula based on Cr of 0.37).    Medical History: Past Medical History  Diagnosis Date  . Hepatitis C   . Gall bladder disease   . Heroin abuse     Assessment: Patient had QTc 4/11 of 394 and on 4/13 of 366. PA reports QTC >500, with bradycardia and HB this AM. Patient is not on any medications that can acutely prolong the QTc interval except Zofran, but no doses of Zofran have been given.  Pharmacy will sign off. Please reconsult for further dosing assitance.  Brently Voorhis S. Merilynn Finlandobertson, PharmD, BCPS Clinical Staff Pharmacist Pager [phone]  Misty Stanleyobertson, Delton Stelle Stillinger 12/27/2015,10:24 AM

## 2015-12-27 NOTE — Progress Notes (Signed)
Spoke with IR to inform them of PICC tip in right jugular and unable to rapid flush down in to SVC. Stated she would come up and show me how to do rapid flush with 3 ml syringe. I did not observe her attempting to flush, but xray was ordered and PICC remains in right jugular. Order for PICC to be repositioned by IR entered and telephoned their department,  I was informed that they would not be able to reposition today, but would schedule for in the morning. Kathlene NovemberMike, RN informed not to use PICC at this time and that PICC should be repositioned by IR in the am. Will continue to monitor.

## 2015-12-27 NOTE — Progress Notes (Signed)
Chest xray results show right upper arm PICC tip in right jugular vein. Sitting up in chair, rapid flush x2 done. Will repeat xray in 1 hour for tip position.Will continue to monitor.

## 2015-12-27 NOTE — Progress Notes (Signed)
[address]       [place] [id number]             [phone]      5 Days Post-Op Procedure(s) (LRB): TRICUSPID VALVE Replacement (N/A) TRANSESOPHAGEAL ECHOCARDIOGRAM (TEE) (N/A) Subjective: Feeling better , more alert today  Objective: Vital signs in last 24 hours: Temp:  [97.9 F (36.6 C)-99.1 F (37.3 C)] 99.1 F (37.3 C) (04/17 91470638) Pulse Rate:  [70-78] 70 (04/17 82950638) Cardiac Rhythm:  [-] Heart block (04/17 0128) Resp:  [16-18] 18 (04/17 0638) BP: (123-130)/(60-68) 123/64 mmHg (04/17 0638) SpO2:  [100 %] 100 % (04/17 62130638) Weight:  [128 lb 1.6 oz (58.106 kg)] 128 lb 1.6 oz (58.106 kg) (04/17 08650638)  Hemodynamic parameters for last 24 hours:    Intake/Output from previous day: 04/16 0701 - 04/17 0700 In: 360 [P.O.:360] Out: 800 [Urine:800] Intake/Output this shift:    General appearance: alert, cooperative and no distress Heart: regular rate and rhythm and no murmur Lungs: coarse Abdomen: benign Extremities: no edema Wound: incis healing well  Lab Results:  Recent Labs  12/25/15 0346 12/26/15 0408  WBC 13.8* 16.1*  HGB 8.3* 8.0*  HCT 24.5* 25.2*  PLT 228 270   BMET:  Recent Labs  12/25/15 0346 12/26/15 0408  NA 134* 133*  K 4.3 4.2  CL 97* 96*  CO2 27 27  GLUCOSE 91 108*  BUN 14 9  CREATININE 0.45 0.37*  CALCIUM 8.0* 8.0*    PT/INR: No results for input(s): LABPROT, INR in the last 72 hours. ABG    Component Value Date/Time   PHART 7.414 12/23/2015 0719   HCO3 26.8* 12/23/2015 0719   TCO2 24 12/23/2015 1713   ACIDBASEDEF 0.8 12/01/2015 0738   O2SAT 96.0 12/23/2015 0719   CBG (last 3)   Recent Labs  12/26/15 1115 12/26/15 1652 12/27/15 0633  GLUCAP 98 93 121*    Meds Scheduled Meds: . antiseptic oral rinse  7 mL Mouth Rinse BID  . aspirin EC  325 mg Oral Daily  . busPIRone  7.5 mg Oral TID  .  ceFAZolin (ANCEF) IV  2 g Intravenous 3 times per day  . docusate sodium  200 mg Oral Daily  .  furosemide  20 mg Oral Daily  . insulin aspart  0-24 Units Subcutaneous TID AC & HS  . multivitamin with minerals  1 tablet Oral Daily  . naloxegol oxalate  12.5 mg Oral Daily  . nicotine  21 mg Transdermal Daily  . oxyCODONE  20 mg Oral Q12H  . pantoprazole  40 mg Oral QAC breakfast  . sodium chloride flush  3 mL Intravenous Q12H  . thiamine  100 mg Oral Daily   Continuous Infusions:  PRN Meds:.sodium chloride, bisacodyl **OR** bisacodyl, guaiFENesin, naLOXone (NARCAN)  injection, ondansetron **OR** ondansetron (ZOFRAN) IV, oxyCODONE, sodium chloride flush, traMADol, zolpidem  Xrays Dg Chest 2 View  12/26/2015  CLINICAL DATA:  25 year old female with history of cough and shortness of breath. Status post open heart surgery on 12/22/2015. EXAM: CHEST  2 VIEW COMPARISON:  Chest x-ray 12/25/2015. FINDINGS: Lung volumes are low. There continues to be widespread and patchy areas of interstitial prominence and airspace consolidation, several which are nodular and mass-like in appearance, most evident throughout the mid to lower lungs bilaterally, compatible with septic emboli (better demonstrated on prior chest CT 11/29/2015). The overall aeration in the lungs is very similar to the prior study. Small bilateral pleural effusions. No evidence  of pulmonary edema. Heart size appears borderline enlarged. Upper mediastinal contours are within normal limits. Status post median sternotomy for tricuspid valve replacement (a stented bioprosthesis is noted). Epicardial pacing wires are noted. IMPRESSION: 1. Overall, allowing for differences in patient positioning, the radiographic appearance the chest is essentially unchanged, as above. Electronically Signed   By: Trudie Reed M.D.   On: 12/26/2015 08:43    Assessment/Plan: S/P Procedure(s) (LRB): TRICUSPID VALVE Replacement (N/A) TRANSESOPHAGEAL ECHOCARDIOGRAM (TEE) (N/A)  1 doing well 2 will order PICC 3 she is hoping to go grandmothers house who is a  Engineer, civil (consulting). ? If this is feasible 4 CHB , brady in 50's at times. QTc > 500 , will ask pharmacy to review meds - EP may need to be re-consulted  LOS: 14 days    Rayn Enderson E 12/27/2015

## 2015-12-27 NOTE — Progress Notes (Addendum)
CARDIAC REHAB PHASE I   PRE:  Rate/Rhythm: 53 ?CHB  BP:  Sitting: 120/68        SaO2: 100 2L  MODE:  Ambulation: 300 ft   POST:  Rate/Rhythm: 60 ?CHB  BP:  Sitting: 141/73         SaO2: 90 3L  Pt up in recliner, very emotional, states she does not want to go to rehab. Pt agreeable to walk, states she hasn't walked this morning. Pt ambulated 300 ft on 2L O2, hand held assist, steady gait, tolerated well overall, pt very anxious, needed reminders to take deep breaths, denies any specific complaints, brief standing rest x1. Pt to bed for PICC insertion after walk, call bell within reach. Will follow.   [phone]  Joylene GrapesEmily C Mare Ludtke, RN, BSN 12/27/2015 10:37 AM  d

## 2015-12-27 NOTE — Progress Notes (Signed)
Peripherally Inserted Central Catheter/Midline Placement  The IV Nurse has discussed with the patient and/or persons authorized to consent for the patient, the purpose of this procedure and the potential benefits and risks involved with this procedure.  The benefits include less needle sticks, lab draws from the catheter and patient may be discharged home with the catheter.  Risks include, but not limited to, infection, bleeding, blood clot (thrombus formation), and puncture of an artery; nerve damage and irregular heat beat.  Alternatives to this procedure were also discussed.  PICC/Midline Placement Documentation  PICC Single Lumen 12/27/15 PICC Right Basilic 39 cm 0 cm (Active)  Indication for Insertion or Continuance of Line Home intravenous therapies (PICC only) 12/27/2015 11:00 AM  Exposed Catheter (cm) 0 cm 12/27/2015 11:00 AM  Dressing Change Due 01/03/16 12/27/2015 11:00 AM       Stacie GlazeJoyce, Crosby Bevan Horton 12/27/2015, 11:28 AM

## 2015-12-28 ENCOUNTER — Inpatient Hospital Stay (HOSPITAL_COMMUNITY): Payer: Self-pay

## 2015-12-28 ENCOUNTER — Encounter: Payer: Self-pay | Admitting: Cardiology

## 2015-12-28 LAB — GLUCOSE, CAPILLARY
GLUCOSE-CAPILLARY: 151 mg/dL — AB (ref 65–99)
GLUCOSE-CAPILLARY: 97 mg/dL (ref 65–99)
GLUCOSE-CAPILLARY: 98 mg/dL (ref 65–99)
Glucose-Capillary: 107 mg/dL — ABNORMAL HIGH (ref 65–99)
Glucose-Capillary: 114 mg/dL — ABNORMAL HIGH (ref 65–99)

## 2015-12-28 MED ORDER — LIDOCAINE HCL 1 % IJ SOLN
INTRAMUSCULAR | Status: AC
  Filled 2015-12-28: qty 20

## 2015-12-28 MED ORDER — CHLORHEXIDINE GLUCONATE 4 % EX LIQD
CUTANEOUS | Status: AC
  Filled 2015-12-28: qty 15

## 2015-12-28 MED ORDER — LIDOCAINE HCL (PF) 1 % IJ SOLN
INTRAMUSCULAR | Status: AC
  Filled 2015-12-28: qty 5

## 2015-12-28 NOTE — Procedures (Signed)
Successful RUE POWER PICC EXCHG Tip svc/ra No comp Stable Ready for use

## 2015-12-28 NOTE — Progress Notes (Signed)
CARDIAC REHAB PHASE I   PRE:  Rate/Rhythm: 56   BP:  Sitting: 119/59        SaO2: 92 RA  MODE:  Ambulation: 300 ft   POST:  Rate/Rhythm: 64  BP:  Sitting: 132/70         SaO2: 93 2L  Pt up in chair, c/o pain from sutures, discomfort from PICC line. Pt c/o cough, however does not cough fully and breathing is shallow. Pt agreeable to walk with encouragement. Pt ambulated 300 ft on 2L O2, independent, steady gait tolerated well, only c/o pain. Encouraged IS, additional ambulation today. Pt verbalized understanding. Pt to recliner after walk, feet elevated, call bell within reach. Will follow.   [phone] Joylene GrapesEmily C Atisha Hamidi, RN, BSN 12/28/2015 1:38 PM

## 2015-12-28 NOTE — Progress Notes (Signed)
CARDIAC REHAB PHASE I   Attempted to ambulate with pt. Pt up in recliner, declines ambulation at this time, states her PICC line hurts her and she is waiting for it to be adjusted before she will walk. RN aware. Pt states she does not feel well overall, c/o increased coughing. Will follow up this afternoon.  Joylene GrapesEmily C Demarkis Gheen, RN, BSN 12/28/2015 10:13 AM

## 2015-12-28 NOTE — Progress Notes (Addendum)
[address]       [place] [address]             [phone]      6 Days Post-Op Procedure(s) (LRB): TRICUSPID VALVE Replacement (N/A) TRANSESOPHAGEAL ECHOCARDIOGRAM (TEE) (N/A) Subjective: Feels worse today, feels cough is worse in particular  Objective: Vital signs in last 24 hours: Temp:  [98 F (36.7 C)-98.3 F (36.8 C)] 98.3 F (36.8 C) (04/17 1954) Pulse Rate:  [70-71] 71 (04/17 1954) Cardiac Rhythm:  [-] Other (Comment) (04/17 2046) Resp:  [18] 18 (04/17 1954) BP: (115-119)/(56-61) 119/56 mmHg (04/17 1954) SpO2:  [100 %] 100 % (04/17 1954)  Hemodynamic parameters for last 24 hours:    Intake/Output from previous day: 04/17 0701 - 04/18 0700 In: 480 [P.O.:480] Out: 750 [Urine:750] Intake/Output this shift:    General appearance: alert, cooperative, fatigued and no distress Heart: regular rate and rhythm and brady, soft systolic murmur Lungs: dim in lower fields, coarse upper BS Abdomen: benign Extremities: no edema Wound: incis healing well  Lab Results:  Recent Labs  12/26/15 0408  WBC 16.1*  HGB 8.0*  HCT 25.2*  PLT 270   BMET:  Recent Labs  12/26/15 0408  NA 133*  K 4.2  CL 96*  CO2 27  GLUCOSE 108*  BUN 9  CREATININE 0.37*  CALCIUM 8.0*    PT/INR: No results for input(s): LABPROT, INR in the last 72 hours. ABG    Component Value Date/Time   PHART 7.414 12/23/2015 0719   HCO3 26.8* 12/23/2015 0719   TCO2 24 12/23/2015 1713   ACIDBASEDEF 0.8 12/01/2015 0738   O2SAT 96.0 12/23/2015 0719   CBG (last 3)   Recent Labs  12/27/15 1206 12/27/15 1631 12/27/15 2125  GLUCAP 92 151* 99    Meds Scheduled Meds: . antiseptic oral rinse  7 mL Mouth Rinse BID  . aspirin EC  325 mg Oral Daily  . busPIRone  7.5 mg Oral TID  .  ceFAZolin (ANCEF) IV  2 g Intravenous 3 times per day  . docusate sodium  200 mg Oral Daily  . furosemide  20 mg Oral Daily  . insulin aspart  0-24 Units Subcutaneous TID AC & HS  .  multivitamin with minerals  1 tablet Oral Daily  . naloxegol oxalate  12.5 mg Oral Daily  . nicotine  21 mg Transdermal Daily  . oxyCODONE  20 mg Oral Q12H  . pantoprazole  40 mg Oral QAC breakfast  . sodium chloride flush  3 mL Intravenous Q12H  . thiamine  100 mg Oral Daily   Continuous Infusions:  PRN Meds:.sodium chloride, bisacodyl **OR** bisacodyl, guaiFENesin, naLOXone (NARCAN)  injection, ondansetron **OR** ondansetron (ZOFRAN) IV, oxyCODONE, sodium chloride flush, sodium chloride flush, traMADol, zolpidem  Xrays Dg Chest Port 1 View  12/27/2015  CLINICAL DATA:  Right PICC inserted, check tip position EXAM: PORTABLE CHEST - 1 VIEW COMPARISON:  Earlier film of the same day FINDINGS: Progressive airspace opacities in the lung bases right greater than left, as well as new patchy airspace opacities in the upper lobes right greater than left. Heart size upper limits normal for technique. Right arm PICC line malpositioned into the IJ vein. Blunting of lateral costophrenic angles suggesting small effusions. No pneumothorax. Previous median sternotomy and AVR. IMPRESSION: 1. Worsening asymmetric airspace opacities, right greater than left, with possible small effusions. 2. Malpositioned PICC line into the IJ vein. Electronically Signed   By: Ronald Pippins.D.  On: 12/27/2015 16:23   Dg Chest Port 1 View  12/27/2015  CLINICAL DATA:  Reassess mouth positioned PICC line. EXAM: PORTABLE CHEST 1 VIEW COMPARISON:  Earlier same day. FINDINGS: Right arm PICC shows a similar course, extending upward into the right neck, the tip not visualized. Previous median sternotomy with mitral valve replacement. Cardiomegaly persists. Mild pulmonary edema with effusions and some atelectatic changes in the lower lungs persist. IMPRESSION: No change in the PICC position compared to the previous study. The right arm PICC extends up into the right neck, tip not visualized. Persistent edema, effusions and atelectasis. These  results will be called to the ordering clinician or representative by the Radiologist Assistant, and communication documented in the PACS or zVision Dashboard. Electronically Signed   By: Paulina FusiMark  Shogry M.D.   On: 12/27/2015 13:44   Dg Chest Port 1 View  12/27/2015  CLINICAL DATA:  Right-sided PICC line placement EXAM: PORTABLE CHEST 1 VIEW COMPARISON:  12/26/2015 FINDINGS: Bilateral lower lobe and to lesser extent right upper lobe airspace disease concerning for multilobar pneumonia. Small bilateral pleural effusions. No pneumothorax. Stable cardiomediastinal silhouette. Prior median sternotomy and tricuspid valve replacement. Right-sided PICC line with its tip coursing cephalad in the right IJ. IMPRESSION: 1. Right-sided PICC line with its tip coursing cephalad in the right IJ. Recommend repositioning prior to use. 2. Multilobar pneumonia. Electronically Signed   By: Elige KoHetal  Patel   On: 12/27/2015 11:56    Assessment/Plan: S/P Procedure(s) (LRB): TRICUSPID VALVE Replacement (N/A) TRANSESOPHAGEAL ECHOCARDIOGRAM (TEE) (N/A)  1 pulm status may be a little worse d/t septic emboli/pneumonis- conts Ancef/pulm toilet 2 CHB- rate is stable in 50's- PPM would be a risky endeavor s/t high risk of infection- cont to monitor 3 PICC to be adjusted 4 SW - needs SNF at d/c   LOS: 15 days    Madeline Andrade 12/28/2015  Patient seen and examined, agree with above HR stable in high 50s low 60s junctional escape Recheck CXr tomorrow To have PICC repositioned today She is more accepting of need for SNF than she was yesterday  Viviann SpareSteven C. Dorris FetchHendrickson, MD [facility] [phone]

## 2015-12-29 ENCOUNTER — Inpatient Hospital Stay (HOSPITAL_COMMUNITY): Payer: Self-pay

## 2015-12-29 ENCOUNTER — Encounter: Payer: Self-pay | Admitting: Cardiology

## 2015-12-29 LAB — GLUCOSE, CAPILLARY
GLUCOSE-CAPILLARY: 155 mg/dL — AB (ref 65–99)
GLUCOSE-CAPILLARY: 113 mg/dL — AB (ref 65–99)
Glucose-Capillary: 127 mg/dL — ABNORMAL HIGH (ref 65–99)
Glucose-Capillary: 161 mg/dL — ABNORMAL HIGH (ref 65–99)

## 2015-12-29 MED ORDER — POTASSIUM CHLORIDE CRYS ER 20 MEQ PO TBCR
20.0000 meq | EXTENDED_RELEASE_TABLET | Freq: Every day | ORAL | Status: DC
  Administered 2015-12-29 – 2016-01-05 (×8): 20 meq via ORAL
  Filled 2015-12-29 (×8): qty 1

## 2015-12-29 MED ORDER — FUROSEMIDE 40 MG PO TABS
40.0000 mg | ORAL_TABLET | Freq: Every day | ORAL | Status: DC
Start: 2015-12-29 — End: 2016-01-05
  Administered 2015-12-29 – 2016-01-05 (×8): 40 mg via ORAL
  Filled 2015-12-29 (×8): qty 1

## 2015-12-29 NOTE — Progress Notes (Addendum)
RT Note: Patient was instructed on proper flutter valve use. Patient demonstrated the technique but did not want to do more than a few breaths because she stated that she did not want to cough and had previously had a coughing spell. RT encouraged her to continue using it and notified her of it's indications and benefits. Patient had no questions, stated her understanding, and the device is at her bedside. Rt will continue to monitor.

## 2015-12-29 NOTE — Progress Notes (Signed)
CARDIAC REHAB PHASE I   PRE:  Rate/Rhythm: 62 SR  BP:  Supine:   Sitting: 113/46  Standing:    SaO2: 100% 2L  MODE:  Ambulation: 550 ft   POST:  Rate/Rhythm: 62 SR  BP:  Supine:   Sitting: 130/59  Standing:    SaO2: 95%RA 1315-1342 Pt walked 550 ft on RA with steady gait. Tolerated well on RA. Pt coughing constantly during walk. Back to recliner  With call bell. Got pt emesis basin, tissues and ice for drink to help with her coughing. She is using pillow to splint. Encouraged IS. Wanted oxygen back on after walk for comfort.   Luetta Nuttingharlene Corena Tilson, RN BSN  12/29/2015 1:38 PM

## 2015-12-29 NOTE — Clinical Social Work Note (Signed)
CSW continues to follow patient for discharge needs. CSW AD will assist with getting patient into SNF once the patient is medically stable to be discharged.   Bryant Jing Howatt MSW, [place] HolRoddie McsteinLCSW, JulianLCASA, [phone]

## 2015-12-29 NOTE — Progress Notes (Addendum)
      [address]       [place] [id number]             [phone]        7 Days Post-Op Procedure(s) (LRB): TRICUSPID VALVE Replacement (N/A) TRANSESOPHAGEAL ECHOCARDIOGRAM (TEE) (N/A)  Subjective: She was asleep but I awakened her. She states her cough is a little better.  Objective: Vital signs in last 24 hours: Temp:  [97.7 F (36.5 C)-98.7 F (37.1 C)] 98.7 F (37.1 C) (04/19 0500) Pulse Rate:  [52-72] 52 (04/19 0500) Cardiac Rhythm:  [-] Heart block (04/18 1900) Resp:  [16-18] 16 (04/19 0500) BP: (113-122)/(51-66) 116/51 mmHg (04/19 0500) SpO2:  [93 %-100 %] 100 % (04/19 0500) Weight:  [126 lb 14.4 oz (57.561 kg)-128 lb (58.06 kg)] 126 lb 14.4 oz (57.561 kg) (04/19 0546)  Pre op weight 52 kg Current Weight  12/29/15 126 lb 14.4 oz (57.561 kg)      Intake/Output from previous day: 04/18 0701 - 04/19 0700 In: 723 [P.O.:720; I.V.:3] Out: 651 [Urine:650; Stool:1]   Physical Exam:  Cardiovascular: Bradycardic Pulmonary: Diminished at bases Abdomen: Soft, non tender, bowel sounds present. Extremities: No lower extremity edema. Wounds: Clean and dry.  No erythema or signs of infection.  Lab Results: CBC:No results for input(s): WBC, HGB, HCT, PLT in the last 72 hours. BMET: No results for input(s): NA, K, CL, CO2, GLUCOSE, BUN, CREATININE, CALCIUM in the last 72 hours.  PT/INR:  Lab Results  Component Value Date   INR 1.49 12/22/2015   INR 1.23 12/21/2015   INR 1.11 11/30/2015   ABG:  INR: Will add last result for INR, ABG once components are confirmed Will add last 4 CBG results once components are confirmed  Assessment/Plan:  1. CV - SB with HR in the 50's, junctional escape. 2.  Pulmonary - On 2 liters of oxygen via [place]. Wean to room air. CXR supposed to be ordered for today but was not so will order and follow up on results.Encourage incentive spirometer 3. Volume Overload - If weight is accurate, she is above pre op  weight;however, she has no LE edema. Monitor for now. 4.  Acute blood loss anemia - Last H and H stable at 8 and 25.2 5. PICC line exchanged yesterday 6.ID- On Cefazolin 2 g IV tid for endocarditis 7. Will discuss timing of removal of EPW as still brady/junctional 8. Will need SNF when ready for discharge.  ZIMMERMAN,DONIELLE MPA-C 12/29/2015,7:43 AM  I followed up on her chest x ray and she has bibasilar atelectasis and pleural effusions. Will order flutter valve and Lasix.  Patient seen and examined, agree with above A lot of what is seen on CXR is airspace disease secondary to septic emboli Will dc pacing wires tomorrow Should be ready for SNF by Friday  Bradly Sangiovanni C. Dorris FetchHendrickson, MD [facility] [phone]

## 2015-12-30 LAB — GLUCOSE, CAPILLARY
GLUCOSE-CAPILLARY: 90 mg/dL (ref 65–99)
GLUCOSE-CAPILLARY: 108 mg/dL — AB (ref 65–99)
Glucose-Capillary: 97 mg/dL (ref 65–99)
Glucose-Capillary: 87 mg/dL (ref 65–99)

## 2015-12-30 NOTE — Progress Notes (Signed)
CARDIAC REHAB PHASE I   Third attempt to ambulate with pt today.  Pt has been sleeping most of the day and declines ambulation again at this time. Pt affect very flat, will not open eyes to converse. Pt c/o back pain earlier, now states her back is better because she is sleeping. Emphasized importance of ambulation, pt states she was told she can walk on her own. Encouraged ambulation x3 today. Will continue to follow.   Joylene GrapesEmily C Dhanush Jokerst, RN, BSN 12/30/2015 3:09 PM

## 2015-12-30 NOTE — Progress Notes (Addendum)
[address]       [place] [id number]             [phone]      8 Days Post-Op Procedure(s) (LRB): TRICUSPID VALVE Replacement (N/A) TRANSESOPHAGEAL ECHOCARDIOGRAM (TEE) (N/A) Subjective: Feels depressed , anxious to leave  Objective: Vital signs in last 24 hours: Temp:  [98.1 F (36.7 C)-98.9 F (37.2 C)] 98.9 F (37.2 C) (04/20 0543) Pulse Rate:  [52-63] 52 (04/20 0543) Cardiac Rhythm:  [-] Heart block (04/19 1900) Resp:  [16-18] 16 (04/20 0543) BP: (119-130)/(50-59) 119/50 mmHg (04/20 0543) SpO2:  [95 %-100 %] 100 % (04/20 0543) Weight:  [124 lb 12.8 oz (56.609 kg)] 124 lb 12.8 oz (56.609 kg) (04/20 0543)  Hemodynamic parameters for last 24 hours:    Intake/Output from previous day: 04/19 0701 - 04/20 0700 In: 963 [P.O.:960; I.V.:3] Out: 1050 [Urine:1050] Intake/Output this shift:    General appearance: alert, cooperative and no distress Heart: regular rate and rhythm and brady Lungs: dim in lower fields Abdomen: benign Extremities: no edema Wound: incis healing well  Lab Results: No results for input(s): WBC, HGB, HCT, PLT in the last 72 hours. BMET: No results for input(s): NA, K, CL, CO2, GLUCOSE, BUN, CREATININE, CALCIUM in the last 72 hours.  PT/INR: No results for input(s): LABPROT, INR in the last 72 hours. ABG    Component Value Date/Time   PHART 7.414 12/23/2015 0719   HCO3 26.8* 12/23/2015 0719   TCO2 24 12/23/2015 1713   ACIDBASEDEF 0.8 12/01/2015 0738   O2SAT 96.0 12/23/2015 0719   CBG (last 3)   Recent Labs  12/29/15 1642 12/29/15 2103 12/30/15 0638  GLUCAP 127* 161* 97    Meds Scheduled Meds: . antiseptic oral rinse  7 mL Mouth Rinse BID  . aspirin EC  325 mg Oral Daily  . busPIRone  7.5 mg Oral TID  .  ceFAZolin (ANCEF) IV  2 g Intravenous 3 times per day  . docusate sodium  200 mg Oral Daily  . furosemide  40 mg Oral Daily  . insulin aspart  0-24 Units Subcutaneous TID AC & HS  . multivitamin with  minerals  1 tablet Oral Daily  . naloxegol oxalate  12.5 mg Oral Daily  . nicotine  21 mg Transdermal Daily  . oxyCODONE  20 mg Oral Q12H  . pantoprazole  40 mg Oral QAC breakfast  . potassium chloride  20 mEq Oral Daily  . sodium chloride flush  3 mL Intravenous Q12H  . thiamine  100 mg Oral Daily   Continuous Infusions:  PRN Meds:.sodium chloride, bisacodyl **OR** bisacodyl, guaiFENesin, naLOXone (NARCAN)  injection, ondansetron **OR** ondansetron (ZOFRAN) IV, oxyCODONE, sodium chloride flush, sodium chloride flush, traMADol, zolpidem  Xrays Dg Chest 2 View  12/29/2015  CLINICAL DATA:  Cardiac surgery 12/22/2015.  Following pneumothorax EXAM: CHEST  2 VIEW COMPARISON:  Radiograph 12/27/2015 FINDINGS: RIGHT PICC line has been repositioned with tip now in the distal SVC. 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 Bilateral partially loculated pleural effusions again noted. Bibasilar airspace opacities not changed from prior. No pneumothorax identified. IMPRESSION: 1. PICC line repositioned with tip in the distal SVC. 2. No significant change in bibasilar airspace disease and bilateral partially loculated pleural effusions. Electronically Signed   By: Genevive BiStewart  Edmunds M.D.   On: 12/29/2015 08:27   Ir Fluoro Guide Cv Line Right  12/28/2015  INDICATION: ACUTE ENDOCARDITIS, ACCESS FOR LONG-TERM ANTIBIOTICS EXAM: ULTRASOUND AND FLUOROSCOPIC GUIDED  PICC LINE INSERTION MEDICATIONS: None. CONTRAST:  None FLUOROSCOPY TIME:  24 seconds COMPLICATIONS: None immediate. TECHNIQUE: The procedure, risks, benefits, and alternatives were explained to the patient and informed written consent was obtained. A timeout was performed prior to the initiation of the procedure. FINDINGS: Under sterile conditions and local anesthesia, the existing right upper arm malpositioned PICC line was retracted and removed over a guidewire. Five French peel-away sheath inserted. Measurements obtained for the appropriate  length. A 37 cm power PICC line was advanced over the guidewire with tip position at the SVC RA junction. Images obtained for documentation. Access ready for use. This was secured externally. The catheter aspirates and flushes normally and is ready for immediate use. IMPRESSION: Successful fluoroscopic exchange of the malpositioned right PICC line for a new 5 French right upper extremity PICC line. Tip now at the SVC RA junction. The PICC line is ready for immediate use. Electronically Signed   By: Judie Petit.  Shick M.D.   On: 12/28/2015 14:53    Assessment/Plan: S/P Procedure(s) (LRB): TRICUSPID VALVE Replacement (N/A) TRANSESOPHAGEAL ECHOCARDIOGRAM (TEE) (N/A)  1 doing well, CHB in 50's is stable 2 she is currently off O2, CXR yesterday is stable 3 PICC in place 4 SNF tomorrow if bed available- she is scheduled for 3day/week outpatient drug rehab in [place] when able to get there. She understands future drug use would be life threatening. Hopefully woll do well in this regard      LOS: 17 days    GOLD,[name] 12/30/2015  Patient seen and examined, agree with above She looks better today Ready for SNF tomorrow if bed available  Viviann Spare C. Dorris Fetch, MD [facility] [phone]
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
## 2015-12-30 NOTE — Discharge Instructions (Signed)
Antibiotic Medicine Antibiotic medicines are used to treat infections caused by bacteria. They work by injuring or killing the bacteria that is making you sick. HOW IS AN ANTIBIOTIC CHOSEN? An antibiotic is chosen based on many factors. To help your health care provider choose one for you, tell your health care provider if:  You have any allergies.  You are pregnant or plan to get pregnant.  You are breastfeeding.  You are taking any medicines. These include over-the-counter medicines, prescription medicines, and herbal remedies.  You have a medical condition or problem you have not already discussed. Your health care provider will also consider:  How often the medicine has to be taken.  Common side effects of the medicine.  The cost of the medicine.  The taste of the medicine. If you have questions about why an antibiotic was chosen, make sure to ask. FOR HOW LONG SHOULD I TAKE MY ANTIBIOTIC? Continue to take your antibiotic for as long as told by your health care provider. Do not stop taking it when you feel better. If you stop taking it too soon:  You may start to feel sick again.  Your infection may become harder to treat.  Complications may develop. WHAT IF I MISS A DOSE? Try not to miss any doses of medicine. If you miss a dose, take it as soon as possible. However, if it is almost time for the next dose:  If you are taking 2 doses per day, take the missed dose and the next dose 5 to 6 hours apart.  If you are taking 3 or more doses per day, take the missed dose and the next dose 2 to 4 hours apart, then go back to the normal schedule. If you cannot make up a missed dose, take the next scheduled dose on time. Then take the missed dose after you have taken all the doses as recommended by your health care provider, as if you had one more dose left. DO ANTIBIOTICS AFFECT BIRTH CONTROL? Birth control pills may not work while you are on antibiotics. If you are taking birth  control pills, continue taking them as usual and use a second form of birth control, such as a condom, to avoid unwanted pregnancy. Continue using the second form of birth control until you are finished with your current 1 month cycle of birth control pills. OTHER INFORMATION  If there is any medicine left over, throw it away.  Never take someone else's antibiotics.  Never take leftover antibiotics. SEEK MEDICAL CARE IF:  You get worse.  You do not feel better within a few days of starting the antibiotic medicine.  You vomit.  White patches appear in your mouth.  You have new joint pain that begins after starting the antibiotic.  You have new muscle aches that begin after starting the antibiotic.  You had a fever before starting the antibiotic and it returns.  You have any symptoms of an allergic reaction, such as an itchy rash. If this happens, stop taking the antibiotic. SEEK IMMEDIATE MEDICAL CARE IF:  Your urine turns dark or becomes blood-colored.  Your skin turns yellow.  You bruise or bleed easily.  You have severe diarrhea and abdominal cramps.  You have a severe headache.  You have signs of a severe allergic reaction, such as:  Trouble breathing.  Wheezing.  Swelling of the lips, tongue, or face.  Fainting.  Blisters on the skin or in the mouth. If you have signs of a severe allergic  reaction, stop taking the antibiotic right away.   This information is not intended to replace advice given to you by your health care provider. Make sure you discuss any questions you have with your health care provider.  Finding Treatment for Addiction WHAT IS ADDICTION? Addiction is a complex disease of the brain. It causes an uncontrollable (compulsive) need for a substance. You can be addicted to alcohol, illegal drugs, or prescription medicines such as painkillers. Addiction can also be a behavior, like gambling or shopping. The need for the drug or activity can become  so strong that you think about it all the time. You can also become physically dependent on a substance. Addiction can change the way your brain works. Because of these changes, getting more of whatever you are addicted to becomes the most important thing to you and feels better than other activities or relationships. Addiction can lead to changes in health, behavior, emotions, relationships, and choices that affect you and everyone around you. HOW DO I KNOW IF I NEED TREATMENT FOR ADDICTION? Addiction is a progressive disease. Without treatment, addiction can get worse. Living with addiction puts you at higher risk for injury, poor health, lost employment, loss of money, and even death. You might need treatment for addiction if: You have tried to stop or cut down, but you cannot. Your addiction is causing physical health problems. You find it annoying that your friends and family are concerned about your alcohol or substance use. You feel guilty about substance abuse or a compulsive behavior. You have lied or tried to hide your addiction. You need a particular substance or activity to start your day or to calm down. You are getting in trouble at school, work, home, or with the police. You have done something illegal to support your addiction. You are running out of money because of your addiction. You have no time for anything other than your addiction. WHAT TYPES OF TREATMENT ARE AVAILABLE? The treatment program that is right for you will depend on many factors, including the type of addiction you have. Treatment programs can be outpatient or inpatient. In an outpatient program, you live at home and go to work or school, but you also go to a clinic for treatment. With an inpatient program, you live and sleep at the program facility during treatment. After treatment, you might need a plan for support during recovery. Other treatment options include:  Medicine. Some addictions may be treated with  prescription medicines. You might also need medicine to treat anxiety or depression. Counseling and behavior therapy. Therapy can help individuals and families behave in healthier ways and relate more effectively. Support groups. Confidential group therapy, such as a 12-step program, can help individuals and families during treatment and recovery. No single type of program is right for everyone. Many treatment programs involve a combination of education, counseling, and a 12-step, spiritually-based approach. Some treatment programs are government sponsored. They are geared for patients who do not have private insurance. Treatment programs can vary in many respects, such as: Cost and types of insurance that are accepted. Types of on-site medical services that are offered. Length of stay, setting, and size. Overall philosophy of treatment. WHAT SHOULD I CONSIDER WHEN SELECTING A TREATMENT PROGRAM? It is important to think about your individual requirements when selecting a treatment program. There are a number of things to consider, such as: If the program is certified by the appropriate government agency. Even private programs must be certified and employ certified professionals.  If the program is covered by your insurance. If finances are a concern, the first call you should make is to your insurance company, if you have health insurance. Ask for a list of treatment programs that are in your network, and confirm any copayments and deductibles that you may have to pay. If you do not have insurance, or if you choose to attend a program that does not accept your insurance, discuss whether a payment plan can be set up. If treatment is available in languages other than English, if needed. If the program offers detoxification treatment, if needed. If 12-step meetings are held at the center or if transport is available for patients to attend meetings at other locations. If the program is professional,  organized, and clean. If the program meets all of your needs, including physical and cultural needs. If the facility offers specific treatment for your particular addiction. If support continues to be offered after you have left the program. If your treatment plan is continually looked at to make sure you are receiving the right treatment at the right time. If mental health counseling is part of your treatment. If medicine is included in treatment, if needed. If your family is included in your treatment plan and if support is offered to them throughout the treatment process. How the treatment works to prevent relapse. WHERE ELSE CAN I GET HELP? Your health care provider. Ask him or her to help you find addiction treatment. These discussions are confidential. The [employer] on Alcoholism and Drug Dependence (NCADD). This group has information about treatment centers and programs for people who have an addiction and for family members. The telephone number is 1-800-NCA-CALL ((317) 158-6737). The website is https://ncadd.org/about-ncadd/our-affiliates The Substance Abuse and Mental Health Services Administration [facility]). This group will help you find publicly funded treatment centers, help hotlines, and counseling services near you. The telephone number is 1-800-662-HELP (941-608-8355). The website is www.findtreatment.[employer].pl In countries outside of the [place]. and [place], look in [employer] for contact information for services in your area.   This information is not intended to replace advice given to you by your health care provider. Make sure you discuss any questions you have with your health care provider.   Document Released: 07/27/2005 Document Revised: 05/19/2015 Document Reviewed: 06/16/2014 Elsevier Interactive Patient Education 2016 Elsevier Inc.  Document Released: 05/10/2004 Document Revised: 05/19/2015 Document Reviewed: 01/13/2015 Elsevier Interactive Patient  Education 2016 Elsevier Inc. Tricuspid valve replacement, Care After Refer to this sheet in the next few weeks. These instructions provide you with information on caring for yourself after your procedure. Your health care provider may also give you specific instructions. Your treatment has been planned according to current medical practices, but problems sometimes occur. Call your health care provider if you have any problems or questions after your procedure.  HOME CARE INSTRUCTIONS   Take medicines only as directed by your health care provider.  Take your temperature every morning for the first 7 days after surgery. Write these down.  Weigh yourself every morning for at least 7 days after surgery. Write your weight down.  Wear elastic stockings during the day for at least 2 weeks after surgery or as directed by your health care provider. Use them longer if your ankles are swollen. The stockings help blood flow and help reduce swelling in the legs.  Take frequent naps or rest often throughout the day.  Avoid lifting more than 10 lb (4.5 kg) or pushing or pulling things with your arms for  6-8 weeks or as directed by your health care provider.  Avoid driving or airplane travel for 4-6 weeks after surgery or as directed. If you are riding in a car for an extended period, stop every 1-2 hours to stretch your legs.  Avoid crossing your legs.  Avoid climbing stairs and using the handrail to pull yourself up for the first 2-3 weeks after surgery.  Do not take baths for 2-4 weeks after surgery. Take showers once your health care provider approves. Pat incisions dry. Do not rub incisions with a washcloth or towel.  Return to work as directed by your health care provider.  Drink enough fluids to keep your urine clear or pale yellow.  Do not strain to have a bowel movement. Eat high-fiber foods if you become constipated. You may also take a medicine to help you have a bowel movement (laxative) as  directed by your health care provider.  Resume sexual activity as directed by your health care provider. SEEK MEDICAL CARE IF:   You develop a skin rash.   Your weight is increasing each day over 2-3 days.  Your weight increases by 2 or more pounds (1 kg) in a single day.  You have a fever. SEEK IMMEDIATE MEDICAL CARE IF:   You develop chest pain that is not coming from your incision.  You develop shortness of breath or difficulty breathing.  You have drainage, redness, swelling, or pain at your incision site.  You have pus coming from your incision.  You develop light-headedness. MAKE SURE YOU:  Understand these directions.  Will watch your condition.  Will get help right away if you are not doing well or get worse.   This information is not intended to replace advice given to you by your health care provider. Make sure you discuss any questions you have with your health care provider.   Document Released: 03/17/2005 Document Revised: 09/18/2014 Document Reviewed: 01/28/2013 Elsevier Interactive Patient Education 2016 [employer]  Endocarditis Endocarditis is an infection of the inner layer of the heart (endocardium) or of the heart valves. Endocarditis can cause growths inside the heart or on the heart valves. These growths can destroy heart tissue and cause heart failure over time. They can also cause stroke if they break away and form a blood clot in the brain. CAUSES  Endocarditis is caused by germs that normally live in or on your body. The germs that most commonly cause endocarditis are bacteria, but fungi can also cause endocarditis. RISK FACTORS Risk factors include:  Having a heart defect.  Having artificial (prosthetic) heart valves.  Having an abnormal or damaged heart valve.  Having a history of endocarditis.  Having had a heart transplant. SIGNS AND SYMPTOMS Signs and symptoms may start suddenly, or they may start slowly and gradually get  worse. Symptoms include:  Fever.  Chills.  Night sweats.  Muscle aches.  Fatigue.  Weakness.  Shortness of breath. Signs include:  An abnormal heart sound (murmur).  Retinal bleeding.  Bleeding under the nails of your fingers or toes.  Painless red spots on your palms.  Painful lumps in your fingertips or toes.  Swelling in your feet or ankles. DIAGNOSIS  To make a diagnosis, your health care provider may:  Perform a physical exam. During the exam he or she will listen to your heart to check for a murmur. He or she may also use a scope to check for bleeding in your retinas.  Order tests. They may include:  Blood tests to look for the germs that cause endocarditis.  An echocardiogram to create an image of your heart. TREATMENT Early treatment offers the best chance for curing endocarditis and preventing complications. Treatment depends on the cause of the endocarditis. Treatment may include:  Antibiotic medicines. These may be given through an IV tube or taken orally.  Surgery to replace your heart valve. You may need surgery if:  The endocarditis does not respond to treatment.  You develop complications.  Your heart valve is severely damaged. HOME CARE INSTRUCTIONS  Take your antibiotic as directed by your health care provider. Finish the antibiotic even if you start to feel better.  Gradually resume your usual activities.  Let your health care provider know before you have any dental or surgical procedures. You may need to take antibiotics before the procedure.  Let all your health care providers, including your dentist, know that you have had endocarditis.  Do not get tattoos or body piercings.  Do not use IV drugs unless it is part of your medical treatment.  Practice good oral hygiene. This includes:  Brushing and flossing regularly.  Scheduling routine dental appointments. SEEK MEDICAL CARE IF:  You have a fever.  Your symptoms do not  improve.  Your symptoms get worse.  Your symptoms come back. SEEK IMMEDIATE MEDICAL CARE IF:  You have trouble breathing.  You have chest pain.  You have symptoms of stroke. These include:  Sudden weakness.  Numbness.  Confusion.  Trouble talking.  A severe headache.   This information is not intended to replace advice given to you by your health care provider. Make sure you discuss any questions you have with your health care provider.   Document Released: 08/28/2005 Document Revised: 09/18/2014 Document Reviewed: 04/14/2014 Elsevier Interactive Patient Education [employer]

## 2015-12-31 LAB — GLUCOSE, CAPILLARY
GLUCOSE-CAPILLARY: 89 mg/dL (ref 65–99)
GLUCOSE-CAPILLARY: 92 mg/dL (ref 65–99)
Glucose-Capillary: 111 mg/dL — ABNORMAL HIGH (ref 65–99)
Glucose-Capillary: 82 mg/dL (ref 65–99)

## 2015-12-31 NOTE — Progress Notes (Signed)
CARDIAC REHAB PHASE I   PRE:  Rate/Rhythm: 57 SB  BP:  Supine: 103/63  Sitting:   Standing:    SaO2: 97%RA  MODE:  Ambulation: 550 ft   POST:  Rate/Rhythm: 62 SR  BP:  Supine:   Sitting: 141/70  Standing:    SaO2: 100%RA 1124-1155 Waited for bedrest to be up and educated pt while waiting. Walked 550 ft with hand held asst with steady gait. Education completed with pt who voiced understanding. Encouraged IS and fluttter valve. Discussed smoking cessation and gave pt handout and fake cigarette. Pt stated she has watched discharge video already. Discussed CRP 2 but will not refer as pt does not know where she will be in a month. Can discuss with cardiologist when she knows where she will live if interested at that time.   Luetta Nuttingharlene Neiva Maenza, RN BSN  12/31/2015 11:51 AM

## 2015-12-31 NOTE — Progress Notes (Addendum)
[address]       [place] [address]             [phone]      9 Days Post-Op Procedure(s) (LRB): TRICUSPID VALVE Replacement (N/A) TRANSESOPHAGEAL ECHOCARDIOGRAM (TEE) (N/A) Subjective: Feels reasonably well   Objective: Vital signs in last 24 hours: Temp:  [98.7 F (37.1 C)-98.9 F (37.2 C)] 98.9 F (37.2 C) (04/21 0525) Pulse Rate:  [51-61] 51 (04/21 0525) Cardiac Rhythm:  [-] Heart block (04/20 1940) Resp:  [16-18] 16 (04/21 0525) BP: (117-125)/(44-59) 119/59 mmHg (04/21 0525) SpO2:  [98 %-100 %] 98 % (04/21 0525) Weight:  [123 lb 0.3 oz (55.8 kg)] 123 lb 0.3 oz (55.8 kg) (04/21 0525)  Hemodynamic parameters for last 24 hours:    Intake/Output from previous day: 04/20 0701 - 04/21 0700 In: 963 [P.O.:960; I.V.:3] Out: 300 [Urine:300] Intake/Output this shift:    General appearance: alert, cooperative and no distress Heart: regular rate and rhythm and brady, no murmur Lungs: dim in lower fields Abdomen: soft, non tender Extremities: no edema Wound: incis healing well  Lab Results: No results for input(s): WBC, HGB, HCT, PLT in the last 72 hours. BMET: No results for input(s): NA, K, CL, CO2, GLUCOSE, BUN, CREATININE, CALCIUM in the last 72 hours.  PT/INR: No results for input(s): LABPROT, INR in the last 72 hours. ABG    Component Value Date/Time   PHART 7.414 12/23/2015 0719   HCO3 26.8* 12/23/2015 0719   TCO2 24 12/23/2015 1713   ACIDBASEDEF 0.8 12/01/2015 0738   O2SAT 96.0 12/23/2015 0719   CBG (last 3)   Recent Labs  12/30/15 1122 12/30/15 1656 12/30/15 2144  GLUCAP 87 90 108*    Meds Scheduled Meds: . antiseptic oral rinse  7 mL Mouth Rinse BID  . aspirin EC  325 mg Oral Daily  . busPIRone  7.5 mg Oral TID  .  ceFAZolin (ANCEF) IV  2 g Intravenous 3 times per day  . docusate sodium  200 mg Oral Daily  . furosemide  40 mg Oral Daily  . insulin aspart  0-24 Units Subcutaneous TID AC & HS  . multivitamin with  minerals  1 tablet Oral Daily  . naloxegol oxalate  12.5 mg Oral Daily  . nicotine  21 mg Transdermal Daily  . oxyCODONE  20 mg Oral Q12H  . pantoprazole  40 mg Oral QAC breakfast  . potassium chloride  20 mEq Oral Daily  . sodium chloride flush  3 mL Intravenous Q12H  . thiamine  100 mg Oral Daily   Continuous Infusions:  PRN Meds:.sodium chloride, bisacodyl **OR** bisacodyl, guaiFENesin, naLOXone (NARCAN)  injection, ondansetron **OR** ondansetron (ZOFRAN) IV, oxyCODONE, sodium chloride flush, sodium chloride flush, traMADol, zolpidem  Xrays Dg Chest 2 View  12/29/2015  CLINICAL DATA:  Cardiac surgery 12/22/2015.  Following pneumothorax EXAM: CHEST  2 VIEW COMPARISON:  Radiograph 12/27/2015 FINDINGS: RIGHT PICC line has been repositioned with tip now in the distal SVC. 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 Bilateral partially loculated pleural effusions again noted. Bibasilar airspace opacities not changed from prior. No pneumothorax identified. IMPRESSION: 1. PICC line repositioned with tip in the distal SVC. 2. No significant change in bibasilar airspace disease and bilateral partially loculated pleural effusions. Electronically Signed   By: Genevive Bi M.D.   On: 12/29/2015 08:27    Assessment/Plan: S/P Procedure(s) (LRB): TRICUSPID VALVE Replacement (N/A) TRANSESOPHAGEAL ECHOCARDIOGRAM (TEE) (N/A)  1 medically ready for tx  to SNF to cont IV abx with PICC   LOS: 18 days    GOLD,[name] 12/31/2015  Patient seen and examined, agree with above Placement has proven to be difficult  Viviann SpareSteven C. Dorris FetchHendrickson, MD [facility] [phone]

## 2015-12-31 NOTE — Progress Notes (Signed)
Epicardial pacing wires have been removed. Ends of wires were intact upon removal. Pt tolerated removal well. Pt's vitals are stable and pt is on bedrest for 1 hour. Vitals are being monitored every 15 minutes while on bedrest. Will continue to monitor.   Berdine DanceLauren Moffitt RN, BSN

## 2015-12-31 NOTE — Progress Notes (Signed)
Utilization review completed.  

## 2015-12-31 NOTE — Clinical Social Work Note (Addendum)
CSW continues to search for SNF. Patient has been denied by all local SNF. Patient will discharge to a difficult to place bed. CS[facility] that patient mentioned to RN. CSW has not received response from facility.   Valero EnergyShonny Nhyla Nappi, LCSW [phone]

## 2015-12-31 NOTE — Clinical Social Work Note (Signed)
CSW was asked by RN to speak with patient regarding placement difficulties as the patient is becoming somewhat agitated. CSW met with patient and explained the difficulties we have been facing with finding a SNF bed for the patient. The patient asked CSW to reach out to [facility] with [facility] SNF to see if they would offer a bed. CSW made Melissa aware of the patient's care needs. 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 CSW will continue to work to find the patient a bed at a difficult to place facility.   Liz Beach MSW, [place], [place], [phone]

## 2015-12-31 NOTE — Care Management Note (Signed)
Case Management Note Previous CM note initiated by H. Mayo RN, CM  Patient Details  Name: Consuela MimesLori Kaye Pacer MRN: [id number] Date of Birth: 10/06/1990  Subjective/Objective:     Pt will need SNF stay to complete IV therapy.  Discussed with pt, her mother, and her grandmother.  Pt repeatedly states that she wants to discharge home to her grandmother's home to complete the IV antibx.  CM and pt's family continue to reiterate that is not an option.  CSW consult entered.                             Action/Plan: 12/31/15- Donn PieriniKristi Tawsha Terrero RN, BSN- PT s/p TV repair - will need continued IV abx- plan for SNF due to hx of IVDU- CSW working on placement- pt medically stable for d/c 12/31/15- CSW aware   Expected Discharge Date:                  Expected Discharge Plan:  Skilled Nursing Facility  In-House Referral:  Clinical Social Work  Discharge planning Services  CM Consult  Post Acute Care Choice:    Choice offered to:     DME Arranged:    DME Agency:     HH Arranged:    HH Agency:     Status of Service:  Completed, signed off  Medicare Important Message Given:    Date Medicare IM Given:    Medicare IM give by:    Date Additional Medicare IM Given:    Additional Medicare Important Message give by:     If discussed at Long Length of Stay Meetings, dates discussed:  12/30/15  Additional Comments:  Darrold SpanWebster, Meryle Pugmire Hall, RN 12/31/2015, 1:32 PM

## 2016-01-01 LAB — GLUCOSE, CAPILLARY: Glucose-Capillary: 99 mg/dL (ref 65–99)

## 2016-01-01 NOTE — Progress Notes (Addendum)
      [address]       [place] [id number]             [phone]      10 Days Post-Op Procedure(s) (LRB): TRICUSPID VALVE Replacement (N/A) TRANSESOPHAGEAL ECHOCARDIOGRAM (TEE) (N/A)   Subjective:  [name] has no complaints this morning.  Pain is controlled.  Frustrated with being unable to find placement for discharge  Objective: Vital signs in last 24 hours: Temp:  [98.3 F (36.8 C)-99.8 F (37.7 C)] 98.6 F (37 C) (04/22 0620) Pulse Rate:  [55-70] 70 (04/22 0620) Cardiac Rhythm:  [-] Normal sinus rhythm (04/21 1900) Resp:  [16-18] 16 (04/22 0620) BP: (103-126)/(54-97) 115/59 mmHg (04/22 0620) SpO2:  [93 %-100 %] 93 % (04/22 0620) Weight:  [120 lb 4.8 oz (54.568 kg)] 120 lb 4.8 oz (54.568 kg) (04/22 0620)  Intake/Output from previous day: 04/21 0701 - 04/22 0700 In: 1090 [P.O.:1080; I.V.:10] Out: 3 [Urine:2; Stool:1]  General appearance: alert, cooperative and no distress Heart: regular rate and rhythm Lungs: clear to auscultation bilaterally Abdomen: soft, non-tender; bowel sounds normal; no masses,  no organomegaly Extremities: edema none appreciated Wound: clean and dry  Lab Results: No results for input(s): WBC, HGB, HCT, PLT in the last 72 hours. BMET: No results for input(s): NA, K, CL, CO2, GLUCOSE, BUN, CREATININE, CALCIUM in the last 72 hours.  PT/INR: No results for input(s): LABPROT, INR in the last 72 hours. ABG    Component Value Date/Time   PHART 7.414 12/23/2015 0719   HCO3 26.8* 12/23/2015 0719   TCO2 24 12/23/2015 1713   ACIDBASEDEF 0.8 12/01/2015 0738   O2SAT 96.0 12/23/2015 0719   CBG (last 3)   Recent Labs  12/31/15 1619 12/31/15 2131 01/01/16 0608  GLUCAP 111* 82 99    Assessment/Plan: S/P Procedure(s) (LRB): TRICUSPID VALVE Replacement (N/A) TRANSESOPHAGEAL ECHOCARDIOGRAM (TEE) (N/A)  1. CV- Sinus Brady 2. Pulm- wean oxygen as tolerated, no acute issues 3. ID- Endocarditis- continue IV ABX end date is  5/26 4. Dispo- patient is stable for discharge, difficulty in finding SNF placement for the patient with H/O of IVDU and need for IV Antibiotics   LOS: 19 days    BARRETT, ERIN 01/01/2016  I have seen and examined the patient and agree with the assessment and plan as outlined.  Purcell Nailslarence H Owen, MD 01/01/2016 9:23 AM

## 2016-01-01 NOTE — Progress Notes (Signed)
CARDIAC REHAB PHASE I   PRE:  Rate/Rhythm: 59 sb  BP:  Sitting: 124/69     SaO2: 94 ra  MODE:  Ambulation: 550 ft   POST:  Rate/Rhythm: 60  BP:  Sitting: 132/68     SaO2: 90 9:37am-9:55am Patient ambulated slow and steady. Became moderately short of breath. Stated she was tired at the end of walk. Placed in chair eating breakfast.  Madeline ListerMolly M Paraskevi Funez, MS 01/01/2016 9:53 AM

## 2016-01-02 NOTE — Progress Notes (Addendum)
      [address]       [place] [id number]             [phone]      11 Days Post-Op Procedure(s) (LRB): TRICUSPID VALVE Replacement (N/A) TRANSESOPHAGEAL ECHOCARDIOGRAM (TEE) (N/A)   Subjective:  No new complaints.  Remains frustrated about not being able to go to SNF.  Objective: Vital signs in last 24 hours: Temp:  [97.9 F (36.6 C)-99 F (37.2 C)] 99 F (37.2 C) (04/23 0441) Pulse Rate:  [58-65] 58 (04/23 0441) Cardiac Rhythm:  [-] Heart block (04/22 1900) Resp:  [18] 18 (04/23 0441) BP: (112-114)/(51-73) 113/56 mmHg (04/23 0441) SpO2:  [97 %-100 %] 97 % (04/23 0441) Weight:  [118 lb 9.7 oz (53.8 kg)] 118 lb 9.7 oz (53.8 kg) (04/23 0441)  Intake/Output from previous day: 04/22 0701 - 04/23 0700 In: 720 [P.O.:720] Out: 7 [Urine:6; Stool:1]  General appearance: alert, cooperative and no distress Heart: regular rate and rhythm Lungs: clear to auscultation bilaterally Abdomen: soft, non-tender; bowel sounds normal; no masses,  no organomegaly Extremities: edema trace Wound: clean and dry  Lab Results: No results for input(s): WBC, HGB, HCT, PLT in the last 72 hours. BMET: No results for input(s): NA, K, CL, CO2, GLUCOSE, BUN, CREATININE, CALCIUM in the last 72 hours.  PT/INR: No results for input(s): LABPROT, INR in the last 72 hours. ABG    Component Value Date/Time   PHART 7.414 12/23/2015 0719   HCO3 26.8* 12/23/2015 0719   TCO2 24 12/23/2015 1713   ACIDBASEDEF 0.8 12/01/2015 0738   O2SAT 96.0 12/23/2015 0719   CBG (last 3)   Recent Labs  12/31/15 1619 12/31/15 2131 01/01/16 0608  GLUCAP 111* 82 99    Assessment/Plan: S/P Procedure(s) (LRB): TRICUSPID VALVE Replacement (N/A) TRANSESOPHAGEAL ECHOCARDIOGRAM (TEE) (N/A)  1. CV- remains hemodynamically stable, Sinus Brady- no beta blocker 2. Pulm- no acute issues, weaning oxygen as tolerated 3. ID- Endocarditis, continue IV ABX until 5/26 4. Dispo- patient remains medically  stable for discharge to SNF, hopefully CSW can provide options tomorrow, ? [facility], as this is associated with [facility]?   LOS: 20 days    BARRETT, ERIN 01/02/2016  I have seen and examined the patient and agree with the assessment and plan as outlined.  Purcell Nailslarence H Manreet Kiernan, MD 01/02/2016 11:42 AM

## 2016-01-03 NOTE — Progress Notes (Signed)
Utilization review completed.  

## 2016-01-03 NOTE — Progress Notes (Signed)
Pt declined walking earlier. Now she just got back from walking 550 ft. Looks good at sink. Will f/u in pm. Ethelda ChickKristan Breshae Belcher CES, ACSM 11:25 AM 01/03/2016

## 2016-01-03 NOTE — Progress Notes (Signed)
CARDIAC REHAB PHASE I   PRE:  Rate/Rhythm: 67 heart block  BP:  Sitting: 130/63        SaO2: 99 RA  MODE:  Ambulation: 550 ft   POST:  Rate/Rhythm: 70 heart block  BP:  Sitting: 114/84         SaO2: 100 RA  Pt ambulated 550 ft on RA, IV, assist x1, steady gait, tolerated well, no complaints other than mild fatigue, declined rest stop. Pt more motivated, walked independently earlier today, smiling more. Pt to edge of bed after walk per pt request, call bell within reach. Will follow.  [id number] Joylene GrapesEmily C Annita Ratliff, RN, BSN 01/03/2016 1:58 PM

## 2016-01-03 NOTE — Clinical Social Work Note (Signed)
CSW continues to follow patient for SNF placement. Per CSW AD, the patient does not have a bed offer from the difficult to place facilities [facility] CSW will notify MD and nursing staff once bed offer is received.   Roddie McBryant Samiya Mervin MSW, [place]LCSW, [place]AS[place], [phone]

## 2016-01-03 NOTE — Progress Notes (Addendum)
      [address]       [place] [id number]             [phone]      12 Days Post-Op Procedure(s) (LRB): TRICUSPID VALVE Replacement (N/A) TRANSESOPHAGEAL ECHOCARDIOGRAM (TEE) (N/A)   Subjective:  [name] has no complaints.  Still awaiting placement for SNF  Objective: Vital signs in last 24 hours: Temp:  [98.1 F (36.7 C)-99.6 F (37.6 C)] 98.1 F (36.7 C) (04/24 0514) Pulse Rate:  [59-70] 59 (04/24 0514) Cardiac Rhythm:  [-] Other (Comment) (04/24 0722) Resp:  [16-20] 16 (04/24 0514) BP: (112-126)/(51-73) 112/51 mmHg (04/24 0514) SpO2:  [96 %-100 %] 97 % (04/24 0514) Weight:  [116 lb 8 oz (52.844 kg)] 116 lb 8 oz (52.844 kg) (04/24 0514)  Intake/Output from previous day: 04/23 0701 - 04/24 0700 In: 360 [P.O.:360] Out: 3 [Urine:3]  General appearance: alert, cooperative and no distress Heart: regular rate and rhythm Lungs: clear to auscultation bilaterally Abdomen: soft, non-tender; bowel sounds normal; no masses,  no organomegaly Extremities: extremities normal, atraumatic, no cyanosis or edema Wound: clean and dry  Lab Results: No results for input(s): WBC, HGB, HCT, PLT in the last 72 hours. BMET: No results for input(s): NA, K, CL, CO2, GLUCOSE, BUN, CREATININE, CALCIUM in the last 72 hours.  PT/INR: No results for input(s): LABPROT, INR in the last 72 hours. ABG    Component Value Date/Time   PHART 7.414 12/23/2015 0719   HCO3 26.8* 12/23/2015 0719   TCO2 24 12/23/2015 1713   ACIDBASEDEF 0.8 12/01/2015 0738   O2SAT 96.0 12/23/2015 0719   CBG (last 3)   Recent Labs  12/31/15 1619 12/31/15 2131 01/01/16 0608  GLUCAP 111* 82 99    Assessment/Plan: S/P Procedure(s) (LRB): TRICUSPID VALVE Replacement (N/A) TRANSESOPHAGEAL ECHOCARDIOGRAM (TEE) (N/A)  1. CV- remains hemodynamically stable, no beta blocker due to sinus brady 2. Pulm- no acute issues, off oxygen 3. ID- Endocarditis, ABX until 5/26 4. Dispo- patient stable,  waiting bed at SNF, placement remains difficult   LOS: 21 days    Raford PitcherBARRETT, ERIN 01/03/2016  Patient seen and examined, agree with above  Viviann SpareSteven C. Dorris FetchHendrickson, MD [facility] [phone]

## 2016-01-03 NOTE — Progress Notes (Signed)
Patient CT sutures removed as ordered steri strips applied. Will monitor patient. Martinez Boxx Jessup RN 

## 2016-01-04 MED ORDER — OXYCODONE HCL ER 20 MG PO T12A: 20.0000 mg | EXTENDED_RELEASE_TABLET | Freq: Two times a day (BID) | ORAL | Status: DC

## 2016-01-04 MED ORDER — CEFAZOLIN SODIUM-DEXTROSE 2-4 GM/100ML-% IV SOLN: 2.0000 g | Freq: Three times a day (TID) | INTRAVENOUS | Status: DC

## 2016-01-04 MED ORDER — ADULT MULTIVITAMIN W/MINERALS CH: 1.0000 | ORAL_TABLET | Freq: Every day | ORAL | Status: DC

## 2016-01-04 MED ORDER — ASPIRIN 325 MG PO TBEC: 325.0000 mg | DELAYED_RELEASE_TABLET | Freq: Every day | ORAL | Status: DC

## 2016-01-04 MED ORDER — HEPARIN SOD (PORK) LOCK FLUSH 100 UNIT/ML IV SOLN
250.0000 [IU] | INTRAVENOUS | Status: AC | PRN
  Administered 2016-01-04: 250 [IU]

## 2016-01-04 MED ORDER — BUSPIRONE HCL 7.5 MG PO TABS: 7.5000 mg | ORAL_TABLET | Freq: Three times a day (TID) | ORAL | Status: DC

## 2016-01-04 MED ORDER — NALOXEGOL OXALATE 12.5 MG PO TABS: 12.5000 mg | ORAL_TABLET | Freq: Every day | ORAL | Status: DC

## 2016-01-04 MED ORDER — THIAMINE HCL 100 MG PO TABS: 100.0000 mg | ORAL_TABLET | Freq: Every day | ORAL | Status: DC

## 2016-01-04 MED ORDER — NICOTINE 21 MG/24HR TD PT24: 21.0000 mg | MEDICATED_PATCH | Freq: Every day | TRANSDERMAL | Status: DC

## 2016-01-04 MED ORDER — OXYCODONE HCL 5 MG PO TABS: 5.0000 mg | ORAL_TABLET | ORAL | Status: DC | PRN

## 2016-01-04 NOTE — Clinical Social Work Placement (Signed)
   CLINICAL SOCIAL WORK PLACEMENT  NOTE  Date:  01/04/2016  Patient Details  Name: Madeline MimesLori Kaye Gunning MRN: [id number] Date of Birth: 09/02/1991  Clinical Social Work is seeking post-discharge placement for this patient at the Skilled  Nursing Facility level of care (*CSW will initial, date and re-position this form in  chart as items are completed):  Yes   Patient/family provided with [facility] Clinical Social Work Department's list of facilities offering this level of care within the geographic area requested by the patient (or if unable, by the patient's family).  Yes   Patient/family informed of their freedom to choose among providers that offer the needed level of care, that participate in Medicare, Medicaid or managed care program needed by the patient, have an available bed and are willing to accept the patient.  Yes   Patient/family informed of [facility]'s ownership interest in [facility] and [facility], as well as of the fact that they are under no obligation to receive care at these facilities.  PASRR submitted to EDS on 12/14/15     PASRR number received on 12/14/15     Existing PASRR number confirmed on       FL2 transmitted to all facilities in geographic area requested by pt/family on 12/14/15     FL2 transmitted to all facilities within larger geographic area on 12/14/15     Patient informed that his/her managed care company has contracts with or will negotiate with certain facilities, including the following:        No   Patient/family informed of bed offers received.  Patient chooses bed at [facility]     Physician recommends and patient chooses bed at      Patient to be transferred to [facility] on 01/04/16.  Patient to be [facility]     Patient family notified on 01/04/16 of transfer.  Name of family member notified:  Patient states she will notify her family of her DC.      PHYSICIAN Please sign FL2, Please prepare priority discharge summary, including medications, Please prepare prescriptions     Additional Comment:   Per MD patient [facility]. RN, patient, patient's family, and facility notified of DC. RN given number for report. Arrangements with [facility] transport made for 6PM transport. DC summary faxed to facility at [phone], fax confirmation received at 4:26PM. Patient will go to facility under a 30 day LOG. [facility] has approved patient to be admitted today. CSW signing off.  _______________________________________________ Roddie McBryant Clifton Kovacic MSW, LCSW, [place], [phone]

## 2016-01-04 NOTE — Progress Notes (Addendum)
Attempted report  To [facility], They stated they will not accept the patient tonight as she was not at their facility by 5pm, explained to representative from [facility] on phone that the Discharge summary was faxed confirmed at 426pm. And transport was set up for 6pm time frame,  Per SW note (see note). She stated she would call me back after she spoke with her supervisor/admission coordinator. Called On call Social Work and made them aware of the situation, awaiting call back.  1858 PM, received a call back from [facility] and the person on the phone was very adamant that they were not taking the patient tonight as she was not at their facility by/ at 5pm, and they were not able to place her in the computer system, and they paper work was not there before 5pm. This RN stated again that per SW note the fax was confirmed at 426pm. And representative from [facility] stated she didn't have it, her paper work stated 5.35pm and would not accept the patient tonight she would have to come in the AM.   1917 Spoke with Lennox LaityJodi in Social work who is waiting on a call from Advertising account plannertheir administrator at [facility], will await call back . Madeline Andrade, Madeline AnKristin Jessup RN

## 2016-01-04 NOTE — Progress Notes (Signed)
1333 Came to see pt to walk. Has walked independently today. Stated back is hurting too much right now. Encouraged her to walk two more times when back is not bothering her. Luetta NuttingCharlene Kalee Broxton RN BSN 01/04/2016 1:34 PM

## 2016-01-04 NOTE — Progress Notes (Addendum)
      [address]       [place] [id number]             [phone]      13 Days Post-Op Procedure(s) (LRB): TRICUSPID VALVE Replacement (N/A) TRANSESOPHAGEAL ECHOCARDIOGRAM (TEE) (N/A)   Subjective:  [name] has no new complaints  Objective: Vital signs in last 24 hours: Temp:  [98.7 F (37.1 C)-98.8 F (37.1 C)] 98.7 F (37.1 C) (04/25 0528) Pulse Rate:  [59-68] 59 (04/25 0528) Cardiac Rhythm:  [-] Sinus bradycardia;Heart block (04/25 0740) Resp:  [19] 19 (04/25 0528) BP: (107-121)/(53-61) 107/58 mmHg (04/25 0528) SpO2:  [99 %] 99 % (04/25 0528) Weight:  [117 lb 14.4 oz (53.479 kg)] 117 lb 14.4 oz (53.479 kg) (04/25 0528)  Intake/Output from previous day: 04/24 0701 - 04/25 0700 In: 350 [P.O.:240; I.V.:10; IV Piggyback:100] Out: -   General appearance: alert, cooperative and no distress Heart: regular rate and rhythm Lungs: clear to auscultation bilaterally Abdomen: soft, non-tender; bowel sounds normal; no masses,  no organomegaly Extremities: edema none appreciated Wound: clean and dry  Lab Results: No results for input(s): WBC, HGB, HCT, PLT in the last 72 hours. BMET: No results for input(s): NA, K, CL, CO2, GLUCOSE, BUN, CREATININE, CALCIUM in the last 72 hours.  PT/INR: No results for input(s): LABPROT, INR in the last 72 hours. ABG    Component Value Date/Time   PHART 7.414 12/23/2015 0719   HCO3 26.8* 12/23/2015 0719   TCO2 24 12/23/2015 1713   ACIDBASEDEF 0.8 12/01/2015 0738   O2SAT 96.0 12/23/2015 0719   CBG (last 3)  No results for input(s): GLUCAP in the last 72 hours.  Assessment/Plan: S/P Procedure(s) (LRB): TRICUSPID VALVE Replacement (N/A) TRANSESOPHAGEAL ECHOCARDIOGRAM (TEE) (N/A)  1. CV-hemodynamically stable 2. ID- Endocarditis, continue ABX per ID until 5/26 3. Dispo- patient remains medically stable for discharge to SNF once bed becomes available   LOS: 22 days    BARRETT, ERIN 01/04/2016  Patient seen  and examined, agree with above She is in better spirits today but still frustrated by lack of available SNF bed  Viviann SpareSteven C. Dorris FetchHendrickson, MD [facility] [phone]

## 2016-01-04 NOTE — Progress Notes (Signed)
This RN followed up with social worker Jody regarding pt unable to be discharged this evening [facility]. Jody informed RN that she has called the Administrator at the facility and is waiting for a call back. At this time transport for the pt is waiting for the pt to be discharged.  Social Worker Jody unable to receive call back from [employer]ministrator tonight. Transport called and updated that pt would not be able to leave tonight.    Belenda CruiseKristin, RN and this RN in to speak with pt and update her on plan of care. Pt was reassured that we did everything we could do for her to be discharged to the facility tonight. Pt almost in tears but she verbalized understanding and was appreciative. RN will continue to monitor.  Roselie AwkwardMegan Sahar Ryback, RN

## 2016-01-04 NOTE — Clinical Social Work Note (Signed)
Patient was discussed in QC, medical director recommended trying to get the patient a bed [facility]. Patient is agreeable to any bed at any facility. [facility]. Facility states they will review the patient's information but that they are unsure they will be able to offer given the risk associated with accepting the patient. CSW AD [facility] to [facility] regarding a difficult to place bed.    UPDATE: The patient has been offered a bed at [facility]. Facility states they will need to receive DC summary prior to patient's transport being set up.   Roddie McBryant Haylie Mccutcheon MSW, [place] FallsLCSW, [place], [phone]

## 2016-01-04 NOTE — Progress Notes (Signed)
CSW spoke with Consulting civil engineerCharge RN at [facility] re: pt's tx to the facility this pm.  RN explained that they did not get pt's med list until 5:30 and needed it by 5:00 in order to order pt's meds.  RN stated that she was told that pt would be coming in the am and that "they could not take care of her tonight.  CSW requested that RN have the facility's administrator call CSW to verify that this was accurate.  CSW also spoke with Unit CSW who confirmed that the facility's regional director who approved transfer this pm as long as d/c information was received at facility by 6:00 pm.  Information was sent to facility at 4:30 per bedside RN/CSW.  Await call back from Administrator.  Pollyann SavoyJody Jaidan Prevette, LCSW Evening/ED CSW [phone]

## 2016-01-05 ENCOUNTER — Inpatient Hospital Stay (HOSPITAL_COMMUNITY): Payer: Self-pay

## 2016-01-05 ENCOUNTER — Ambulatory Visit (HOSPITAL_COMMUNITY): Payer: Self-pay

## 2016-01-05 DIAGNOSIS — I82401 Acute embolism and thrombosis of unspecified deep veins of right lower extremity: Secondary | ICD-10-CM

## 2016-01-05 MED ORDER — CEFAZOLIN SODIUM-DEXTROSE 2-4 GM/100ML-% IV SOLN: 2.0000 g | Freq: Three times a day (TID) | INTRAVENOUS | Status: DC

## 2016-01-05 MED ORDER — HEPARIN SOD (PORK) LOCK FLUSH 100 UNIT/ML IV SOLN
250.0000 [IU] | INTRAVENOUS | Status: AC | PRN
  Administered 2016-01-05: 250 [IU]

## 2016-01-05 MED ORDER — ADULT MULTIVITAMIN W/MINERALS CH: 1.0000 | ORAL_TABLET | Freq: Every day | ORAL | Status: AC

## 2016-01-05 NOTE — Progress Notes (Signed)
PA made aware of doppler results: negative for DVT. Said to proceed with discharge.  Pt discharged with PICC line in place - heparin locked.   Sent with paper prescriptions.   Report called to RN at [facility] - all questions answered.   Leonidas Rombergaitlin S Bumbledare, RN

## 2016-01-05 NOTE — Progress Notes (Signed)
Pt having extreme pain in her Right shoulder. Pt given pain medication throughout the night in addition to K pad and still no relief. Pt felt like it may have something to do with her PICC line. PICC line checked multiple times by IV team during the night.  RN will continue to monitor.  Roselie AwkwardMegan Courvoisier Hamblen, RN

## 2016-01-05 NOTE — Clinical Social Work Note (Signed)
Transport set for patient for 12PM pickup. Facility aware of patient's transport time. CSW signing off.   Roddie McBryant Francoise Chojnowski MSW, [place]LCSW, [place]ASA, [phone]

## 2016-01-05 NOTE — Progress Notes (Addendum)
      [address]       [place] [id number]             [phone]        14 Days Post-Op Procedure(s) (LRB): TRICUSPID VALVE Replacement (N/A) TRANSESOPHAGEAL ECHOCARDIOGRAM (TEE) (N/A)  Subjective: Patient has a fair amount of right shoulder pain (under her arm and posterior) since her dressing change yesterday. Kpad is NOT helping nor are narcotics.  Objective: Vital signs in last 24 hours: Temp:  [98.6 F (37 C)-99.2 F (37.3 C)] 99.2 F (37.3 C) (04/26 0614) Pulse Rate:  [61-65] 61 (04/26 0614) Cardiac Rhythm:  [-] Heart block (04/25 2037) Resp:  [18-20] 18 (04/26 0614) BP: (117-122)/(57-63) 117/62 mmHg (04/26 0614) SpO2:  [96 %-98 %] 96 % (04/26 0614)  Pre op weight  kg Current Weight  01/04/16 117 lb 14.4 oz (53.479 kg)       Intake/Output from previous day: 04/25 0701 - 04/26 0700 In: 720 [P.O.:720] Out: -    Physical Exam:  Cardiovascular: RRR. Pulmonary: Clear to auscultation bilaterally; no rales, wheezes, or rhonchi. Abdomen: Soft, non tender, bowel sounds present. Extremities: No lower extremity edema. Wounds: Clean and dry.  No erythema or signs of infection.  Lab Results: CBC:No results for input(s): WBC, HGB, HCT, PLT in the last 72 hours. BMET: No results for input(s): NA, K, CL, CO2, GLUCOSE, BUN, CREATININE, CALCIUM in the last 72 hours.  PT/INR:  Lab Results  Component Value Date   INR 1.49 12/22/2015   INR 1.23 12/21/2015   INR 1.11 11/30/2015   ABG:  INR: Will add last result for INR, ABG once components are confirmed Will add last 4 CBG results once components are confirmed  Assessment/Plan:  1. CV - HR remains in the 60's. 2.  Pulmonary - Encourage incentive spirometer. CXR is stable (no pneumothorax) 3. ID-on Ancef for endocarditis. 4. Volume overload-on Lasix 40 mg daily 5. Regarding right shoulder pain, will check CXR. PICC line has been checked and is in good position. Will ask PICC line team to apply  new dressing.  As discussed with Dr. Dorris FetchHendrickson, will discharge to SNF today  ZIMMERMAN,DONIELLE MPA-C 01/05/2016,7:27 AM

## 2016-01-05 NOTE — Clinical Social Work Note (Signed)
CSW received handoff report from evening CSW regarding patient's failed discharge. All arrangements for patient's discharge were made within time frames set by facility admission coordinator Debbie. Discharge summary was successfully faxed at 4:26PM and transport set [facility] for 6PM pickup. Facility admission coordinator Debbie approved patient to arrive after 5PM. CSW will assist with patient's DC early this AM.  Roddie McBryant Markies Mowatt MSW, [place]LCSW, [place]ASA, [phone]

## 2016-01-05 NOTE — Progress Notes (Signed)
Assessed rt arm and PICC after pt reported pain in Rt underarm area after dressing change to PICC yesterday, no redness, swelling, tenderness, or warmth noted, easily returns blood and flushes, talked to PICC nurse and she advised an US to role out DVT, notified pt and her nurse, nurse to notify PA.

## 2016-01-05 NOTE — Progress Notes (Signed)
*  Preliminary Results* Right upper extremity venous duplex completed. Right upper extremity is negative for deep and superficial vein thrombosis.  01/05/2016 11:23 AM  Gertie FeyMichelle Challis Crill, RVT, RDCS, RDMS

## 2016-01-05 NOTE — Care Management Note (Signed)
Case Management Note Previous CM note initiated by H. Mayo RN, CM  Patient Details  Name: Consuela MimesLori Kaye Mcweeney MRN: [id number] Date of Birth: 03/18/1991  Subjective/Objective:     Pt will need SNF stay to complete IV therapy.  Discussed with pt, her mother, and her grandmother.  Pt repeatedly states that she wants to discharge home to her grandmother's home to complete the IV antibx.  CM and pt's family continue to reiterate that is not an option.  CSW consult entered.                             Action/Plan: 12/31/15- Donn PieriniKristi Francesa Eugenio RN, BSN- PT s/p TV repair - will need continued IV abx- plan for SNF due to hx of IVDU- CSW working on placement- pt medically stable for d/c 12/31/15- CSW aware   Expected Discharge Date:    01/05/16              Expected Discharge Plan:  Skilled Nursing Facility  In-House Referral:  Clinical Social Work  Discharge planning Services  CM Consult  Post Acute Care Choice:    Choice offered to:     DME Arranged:    DME Agency:     HH Arranged:    HH Agency:     Status of Service:  Completed, signed off  Medicare Important Message Given:    Date Medicare IM Given:    Medicare IM give by:    Date Additional Medicare IM Given:    Additional Medicare Important Message give by:     If discussed at Long Length of Stay Meetings, dates discussed:  12/30/15, 01/04/16  Additional Comments:  01/05/16- per CSW [facility]- pt to be transported today to SNF  Zenda AlpersWebster, Lenn SinkKristi Hall, RN 01/05/2016, 10:36 AM

## 2016-01-07 NOTE — Progress Notes (Signed)
entered in error  ROS   This encounter was created in error - please disregard.

## 2016-01-09 ENCOUNTER — Inpatient Hospital Stay (HOSPITAL_COMMUNITY)
Admission: EM | Admit: 2016-01-09 | Discharge: 2016-01-12 | DRG: 290 | Disposition: A | Discharge status: Home / Self Care | Payer: MEDICAID | Attending: Internal Medicine | Admitting: Internal Medicine

## 2016-01-09 ENCOUNTER — Encounter (HOSPITAL_COMMUNITY): Payer: Self-pay | Admitting: *Deleted

## 2016-01-09 DIAGNOSIS — B1920 Unspecified viral hepatitis C without hepatic coma: Secondary | ICD-10-CM | POA: Diagnosis present

## 2016-01-09 DIAGNOSIS — G894 Chronic pain syndrome: Secondary | ICD-10-CM | POA: Diagnosis present

## 2016-01-09 DIAGNOSIS — Z86711 Personal history of pulmonary embolism: Secondary | ICD-10-CM

## 2016-01-09 DIAGNOSIS — Z792 Long term (current) use of antibiotics: Secondary | ICD-10-CM

## 2016-01-09 DIAGNOSIS — K5903 Drug induced constipation: Secondary | ICD-10-CM | POA: Diagnosis present

## 2016-01-09 DIAGNOSIS — T402X5A Adverse effect of other opioids, initial encounter: Secondary | ICD-10-CM | POA: Diagnosis present

## 2016-01-09 DIAGNOSIS — Z7982 Long term (current) use of aspirin: Secondary | ICD-10-CM

## 2016-01-09 DIAGNOSIS — F17210 Nicotine dependence, cigarettes, uncomplicated: Secondary | ICD-10-CM | POA: Diagnosis present

## 2016-01-09 DIAGNOSIS — Z820 Family history of epilepsy and other diseases of the nervous system: Secondary | ICD-10-CM

## 2016-01-09 DIAGNOSIS — Z833 Family history of diabetes mellitus: Secondary | ICD-10-CM

## 2016-01-09 DIAGNOSIS — Z79899 Other long term (current) drug therapy: Secondary | ICD-10-CM

## 2016-01-09 DIAGNOSIS — R001 Bradycardia, unspecified: Secondary | ICD-10-CM | POA: Diagnosis present

## 2016-01-09 DIAGNOSIS — Z8249 Family history of ischemic heart disease and other diseases of the circulatory system: Secondary | ICD-10-CM

## 2016-01-09 DIAGNOSIS — Z952 Presence of prosthetic heart valve: Secondary | ICD-10-CM

## 2016-01-09 DIAGNOSIS — F1910 Other psychoactive substance abuse, uncomplicated: Secondary | ICD-10-CM | POA: Diagnosis present

## 2016-01-09 DIAGNOSIS — F329 Major depressive disorder, single episode, unspecified: Secondary | ICD-10-CM | POA: Diagnosis present

## 2016-01-09 DIAGNOSIS — Z79891 Long term (current) use of opiate analgesic: Secondary | ICD-10-CM

## 2016-01-09 DIAGNOSIS — B958 Unspecified staphylococcus as the cause of diseases classified elsewhere: Secondary | ICD-10-CM | POA: Diagnosis present

## 2016-01-09 DIAGNOSIS — I38 Endocarditis, valve unspecified: Secondary | ICD-10-CM

## 2016-01-09 DIAGNOSIS — I330 Acute and subacute infective endocarditis: Principal | ICD-10-CM | POA: Diagnosis present

## 2016-01-09 DIAGNOSIS — F32A Depression, unspecified: Secondary | ICD-10-CM | POA: Diagnosis present

## 2016-01-09 DIAGNOSIS — Z954 Presence of other heart-valve replacement: Secondary | ICD-10-CM

## 2016-01-09 LAB — COMPREHENSIVE METABOLIC PANEL
ALBUMIN: 2.4 g/dL — AB (ref 3.5–5.0)
ALK PHOS: 172 U/L — AB (ref 38–126)
ALT: 17 U/L (ref 14–54)
AST: 33 U/L (ref 15–41)
Anion gap: 9 (ref 5–15)
BILIRUBIN TOTAL: 0.5 mg/dL (ref 0.3–1.2)
BUN: 8 mg/dL (ref 6–20)
CALCIUM: 8.6 mg/dL — AB (ref 8.9–10.3)
CO2: 27 mmol/L (ref 22–32)
Chloride: 104 mmol/L (ref 101–111)
Creatinine, Ser: 0.47 mg/dL (ref 0.44–1.00)
Glucose, Bld: 103 mg/dL — ABNORMAL HIGH (ref 65–99)
POTASSIUM: 3.7 mmol/L (ref 3.5–5.1)
Sodium: 140 mmol/L (ref 135–145)
TOTAL PROTEIN: 7.7 g/dL (ref 6.5–8.1)

## 2016-01-09 LAB — I-STAT CG4 LACTIC ACID, ED: LACTIC ACID, VENOUS: 1.42 mmol/L (ref 0.5–2.0)

## 2016-01-09 LAB — CBC WITH DIFFERENTIAL/PLATELET
BASOS ABS: 0 10*3/uL (ref 0.0–0.1)
BASOS PCT: 1 %
EOS ABS: 0.2 10*3/uL (ref 0.0–0.7)
Eosinophils Relative: 2 %
HEMATOCRIT: 32.2 % — AB (ref 36.0–46.0)
HEMOGLOBIN: 9.9 g/dL — AB (ref 12.0–15.0)
Lymphocytes Relative: 23 %
Lymphs Abs: 1.9 10*3/uL (ref 0.7–4.0)
MCH: 27.4 pg (ref 26.0–34.0)
MCHC: 30.7 g/dL (ref 30.0–36.0)
MCV: 89.2 fL (ref 78.0–100.0)
MONOS PCT: 7 %
Monocytes Absolute: 0.6 10*3/uL (ref 0.1–1.0)
NEUTROS ABS: 5.8 10*3/uL (ref 1.7–7.7)
NEUTROS PCT: 67 %
Platelets: 451 10*3/uL — ABNORMAL HIGH (ref 150–400)
RBC: 3.61 MIL/uL — AB (ref 3.87–5.11)
RDW: 14.9 % (ref 11.5–15.5)
WBC: 8.5 10*3/uL (ref 4.0–10.5)

## 2016-01-09 MED ORDER — SODIUM CHLORIDE 0.9 % IV SOLN
INTRAVENOUS | Status: DC
  Administered 2016-01-09: 21:00:00 via INTRAVENOUS

## 2016-01-09 MED ORDER — OXYCODONE HCL 5 MG PO TABS
10.0000 mg | ORAL_TABLET | ORAL | Status: DC | PRN
  Administered 2016-01-09 – 2016-01-10 (×3): 10 mg via ORAL
  Filled 2016-01-09 (×3): qty 2

## 2016-01-09 MED ORDER — OXYCODONE HCL ER 10 MG PO T12A
20.0000 mg | EXTENDED_RELEASE_TABLET | Freq: Two times a day (BID) | ORAL | Status: DC
  Administered 2016-01-10 – 2016-01-12 (×6): 20 mg via ORAL
  Filled 2016-01-09 (×6): qty 2

## 2016-01-09 MED ORDER — NALOXEGOL OXALATE 12.5 MG PO TABS
12.5000 mg | ORAL_TABLET | Freq: Every day | ORAL | Status: DC
  Administered 2016-01-10 – 2016-01-12 (×3): 12.5 mg via ORAL
  Filled 2016-01-09 (×4): qty 1

## 2016-01-09 MED ORDER — BUSPIRONE HCL 15 MG PO TABS: 7.5000 mg | ORAL_TABLET | Freq: Three times a day (TID) | ORAL | Status: DC

## 2016-01-09 MED ORDER — CEFAZOLIN SODIUM-DEXTROSE 2-4 GM/100ML-% IV SOLN
2.0000 g | Freq: Three times a day (TID) | INTRAVENOUS | Status: AC
  Administered 2016-01-10 – 2016-01-11 (×6): 2 g via INTRAVENOUS
  Filled 2016-01-09 (×8): qty 100

## 2016-01-09 MED ORDER — CEFAZOLIN SODIUM-DEXTROSE 2-4 GM/100ML-% IV SOLN
2.0000 g | Freq: Once | INTRAVENOUS | Status: AC
  Administered 2016-01-09: 2 g via INTRAVENOUS
  Filled 2016-01-09: qty 100

## 2016-01-09 MED ORDER — VITAMIN B-1 100 MG PO TABS
100.0000 mg | ORAL_TABLET | Freq: Every day | ORAL | Status: DC
  Administered 2016-01-10: 100 mg via ORAL
  Filled 2016-01-09: qty 1

## 2016-01-09 MED ORDER — NICOTINE 21 MG/24HR TD PT24
21.0000 mg | MEDICATED_PATCH | Freq: Every day | TRANSDERMAL | Status: DC
  Administered 2016-01-10 – 2016-01-12 (×3): 21 mg via TRANSDERMAL
  Filled 2016-01-09 (×3): qty 1

## 2016-01-09 NOTE — ED Notes (Signed)
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  Pt was taken out and suggested she come here to make sure the anbx had been working.  She needs 2.5 weeks more of IV anbx.

## 2016-01-09 NOTE — ED Provider Notes (Signed)
CSN: [id number]     Arrival date & time 01/09/16  1842 History   First MD Initiated Contact with Patient 01/09/16 1959     Chief Complaint  Patient presents with  . Post Valve sx      (Consider location/radiation/quality/duration/timing/severity/associated sxs/prior Treatment) HPI   Madeline Andrade is a 25 y.o. 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 The patient left her nursing care facility today because of a problem with medication administration, 4 days ago. She feels like she was given the wrong medication at that time. Today, she and her family decided to sign herself out of the nursing facility. She has had 2 doses of Ancef, today, but needs a third. Her PICC line was removed prior to her discharge, today. According to her schedule, she is to bring it may receiving Ancef 2 g 3 times a day until 02/02/2016, based on day of surgery of 12/22/2015. She denies recent fever, chills, nausea, vomiting, cough, shortness of breath or new chest pain. She has ongoing chest discomfort from her recent cardiac surgery. There are no other known modifying factors.   Past Medical History  Diagnosis Date  . Hepatitis C   . Gall bladder disease   . Heroin abuse    Past Surgical History  Procedure Laterality Date  . Cesarean section    . Tee without cardioversion N/A 12/20/2015    Procedure: TRANSESOPHAGEAL ECHOCARDIOGRAM (TEE);  Surgeon: Vesta Mixer, MD;  Location: [facility] ENDOSCOPY;  Service: Cardiovascular;  Laterality: N/A;  . Tricuspid valve replacement N/A 12/22/2015    Procedure: TRICUSPID VALVE Replacement;  Surgeon: Loreli Slot, MD;  Location: [facility] OR;  Service: Open Heart Surgery;  Laterality: N/A;  . Tee without cardioversion N/A 12/22/2015    Procedure: TRANSESOPHAGEAL ECHOCARDIOGRAM (TEE);  Surgeon: Loreli Slot, MD;  Location: [facility] OR;  Service: Open Heart Surgery;  Laterality: N/A;   Family History  Problem Relation Age of Onset  . Multiple sclerosis Mother   .  Hypertension Father   . Diabetes Mellitus I Father    Social History  Substance Use Topics  . Smoking status: Current Some Day Smoker -- 1.00 packs/day    Types: Cigarettes  . Smokeless tobacco: None  . Alcohol Use: No     Comment: wine occasional   OB History    No data available     Review of Systems  All other systems reviewed and are negative.     Allergies  Review of patient's allergies indicates no known allergies.  Home Medications   Prior to Admission medications   Medication Sig Start Date End Date Taking? Authorizing Provider  aspirin EC 325 MG EC tablet Take 1 tablet (325 mg total) by mouth daily. 01/04/16   [name] Gold, PA-C  busPIRone (BUSPAR) 7.5 MG tablet Take 1 tablet (7.5 mg total) by mouth 3 (three) times daily. 01/04/16   [name] Gold, PA-C  ceFAZolin (ANCEF) 2-4 GM/100ML-% IVPB Inject 100 mLs (2 g total) into the vein every 8 (eight) hours. PATIENT NEEDS 27 DAYS MORE OF THE ANCEF 01/05/16 02/03/16  Donielle Margaretann Loveless, PA-C  Multiple Vitamin (MULTIVITAMIN WITH MINERALS) TABS tablet Take 1 tablet by mouth daily. 01/05/16   Donielle Margaretann Loveless, PA-C  naloxegol oxalate (MOVANTIK) 12.5 MG TABS tablet Take 1 tablet (12.5 mg total) by mouth daily. 01/04/16   [name] Gold, PA-C  nicotine (NICODERM CQ - DOSED IN MG/24 HOURS) 21 mg/24hr patch Place 1 patch (21 mg total) onto the skin  daily. 01/04/16   Rowe Clack, PA-C  oxyCODONE (OXY IR/ROXICODONE) 5 MG immediate release tablet Take 1-2 tablets (5-10 mg total) by mouth every 4 (four) hours as needed for severe pain. 01/04/16   [name] Gold, PA-C  oxyCODONE (OXYCONTIN) 20 mg 12 hr tablet Take 1 tablet (20 mg total) by mouth every 12 (twelve) hours. 01/04/16   [name] Gold, PA-C  thiamine 100 MG tablet Take 1 tablet (100 mg total) by mouth daily. 01/04/16   [name] Gold, PA-C   BP 143/62 mmHg  Pulse 57  Temp(Src) 98.3 F (36.8 C) (Oral)  Resp 18  SpO2 100% Physical Exam  Constitutional: She is oriented to person,  place, and time. She appears well-developed and well-nourished.  She appears undernourished  HENT:  Head: Normocephalic and atraumatic.  Right Ear: External ear normal.  Left Ear: External ear normal.  Eyes: Conjunctivae and EOM are normal. Pupils are equal, round, and reactive to light.  Neck: Normal range of motion and phonation normal. Neck supple.  Cardiovascular: Normal rate, regular rhythm and normal heart sounds.   Pulmonary/Chest: Effort normal and breath sounds normal. No respiratory distress. She has no wheezes. She exhibits no bony tenderness.  Well-healed midline, anterior chest scar. No bleeding, drainage or swelling associated. Mild tenderness of the chest wall.  Abdominal: Soft. There is no tenderness.  Musculoskeletal: Normal range of motion.  Recent vascular access site, right forearm, is nontender and without bleeding.  Neurological: She is alert and oriented to person, place, and time. No cranial nerve deficit or sensory deficit. She exhibits normal muscle tone. Coordination normal.  Skin: Skin is warm, dry and intact.  Psychiatric: She has a normal mood and affect. Her behavior is normal. Judgment and thought content normal.  Nursing note and vitals reviewed.   ED Course  Procedures (including critical care time)  Initial clinical impression- partially treated endocarditis, with psychosocial issues, and former drug use. Patient will require additional treatment, going forward. We'll consult social work to see what avenues might be available, and give her her third daily dose of Ancef, now.   Medications  ceFAZolin (ANCEF) IVPB 2g/100 mL premix (2 g Intravenous New Bag/Given 01/09/16 2059)  0.9 %  sodium chloride infusion ( Intravenous New Bag/Given 01/09/16 2059)  oxyCODONE (Oxy IR/ROXICODONE) immediate release tablet 10 mg (10 mg Oral Given 01/09/16 2123)    Patient Vitals for the past 24 hrs:  BP Temp Temp src Pulse Resp SpO2  01/09/16 1849 143/62 mmHg 98.3 F  (36.8 C) Oral (!) 57 18 100 %        Labs Review Labs Reviewed  COMPREHENSIVE METABOLIC PANEL - Abnormal; Notable for the following:    Glucose, Bld 103 (*)    Calcium 8.6 (*)    Albumin 2.4 (*)    Alkaline Phosphatase 172 (*)    All other components within normal limits  CBC WITH DIFFERENTIAL/PLATELET - Abnormal; Notable for the following:    RBC 3.61 (*)    Hemoglobin 9.9 (*)    HCT 32.2 (*)    Platelets 451 (*)    All other components within normal limits  CULTURE, BLOOD (ROUTINE X 2)  CULTURE, BLOOD (ROUTINE X 2)  URINE RAPID DRUG SCREEN, HOSP PERFORMED  I-STAT CG4 LACTIC ACID, ED  I-STAT CG4 LACTIC ACID, ED    Imaging Review No results found. I have personally reviewed and evaluated these images and lab results as part of my medical decision-making.   EKG Interpretation None  MDM   Final diagnoses:  Endocarditis    Endocarditis, requiring ongoing treatment. Patient will need to be placed in another nursing care facility to ensure appropriate treatment. She will also need a PICC line placed.  Nursing Notes Reviewed/ Care Coordinated, and agree without changes. Applicable Imaging Reviewed.  Interpretation of Laboratory Data incorporated into ED treatment  As per oncoming provider team    Mancel BaleElliott Daena Alper, MD 01/10/16 [phone]

## 2016-01-10 DIAGNOSIS — F329 Major depressive disorder, single episode, unspecified: Secondary | ICD-10-CM

## 2016-01-10 DIAGNOSIS — G894 Chronic pain syndrome: Secondary | ICD-10-CM | POA: Diagnosis present

## 2016-01-10 DIAGNOSIS — F32A Depression, unspecified: Secondary | ICD-10-CM | POA: Diagnosis present

## 2016-01-10 LAB — RAPID URINE DRUG SCREEN, HOSP PERFORMED
Amphetamines: NOT DETECTED
Barbiturates: NOT DETECTED
Benzodiazepines: POSITIVE — AB
Cocaine: NOT DETECTED
OPIATES: NOT DETECTED
Tetrahydrocannabinol: NOT DETECTED

## 2016-01-10 MED ORDER — BUSPIRONE HCL 15 MG PO TABS
7.5000 mg | ORAL_TABLET | Freq: Three times a day (TID) | ORAL | Status: DC
  Administered 2016-01-10 – 2016-01-12 (×8): 7.5 mg via ORAL
  Filled 2016-01-10 (×10): qty 1

## 2016-01-10 MED ORDER — OXYCODONE HCL 5 MG PO TABS
5.0000 mg | ORAL_TABLET | ORAL | Status: DC | PRN
  Administered 2016-01-10 – 2016-01-12 (×6): 5 mg via ORAL
  Filled 2016-01-10 (×6): qty 1

## 2016-01-10 MED ORDER — BUSPIRONE HCL 15 MG PO TABS: 7.5000 mg | ORAL_TABLET | Freq: Three times a day (TID) | ORAL | Status: DC

## 2016-01-10 MED ORDER — ACETAMINOPHEN 325 MG PO TABS: 650.0000 mg | ORAL_TABLET | Freq: Four times a day (QID) | ORAL | Status: DC | PRN

## 2016-01-10 MED ORDER — ACETAMINOPHEN 650 MG RE SUPP: 650.0000 mg | Freq: Four times a day (QID) | RECTAL | Status: DC | PRN

## 2016-01-10 MED ORDER — THIAMINE HCL 100 MG/ML IJ SOLN: 100.0000 mg | Freq: Every day | INTRAMUSCULAR | Status: DC

## 2016-01-10 MED ORDER — ENOXAPARIN SODIUM 40 MG/0.4ML ~~LOC~~ SOLN
40.0000 mg | SUBCUTANEOUS | Status: DC
  Administered 2016-01-10 – 2016-01-11 (×2): 40 mg via SUBCUTANEOUS
  Filled 2016-01-10 (×2): qty 0.4

## 2016-01-10 MED ORDER — SODIUM CHLORIDE 0.45 % IV SOLN
INTRAVENOUS | Status: DC
  Administered 2016-01-10: 18:00:00 via INTRAVENOUS

## 2016-01-10 MED ORDER — ADULT MULTIVITAMIN W/MINERALS CH
1.0000 | ORAL_TABLET | Freq: Every day | ORAL | Status: DC
Start: 2016-01-10 — End: 2016-01-12
  Administered 2016-01-10 – 2016-01-12 (×3): 1 via ORAL
  Filled 2016-01-10 (×3): qty 1

## 2016-01-10 MED ORDER — LORAZEPAM 2 MG/ML IJ SOLN: 1.0000 mg | Freq: Four times a day (QID) | INTRAMUSCULAR | Status: DC | PRN

## 2016-01-10 MED ORDER — ASPIRIN EC 325 MG PO TBEC
325.0000 mg | DELAYED_RELEASE_TABLET | Freq: Every day | ORAL | Status: DC
  Administered 2016-01-10 – 2016-01-12 (×3): 325 mg via ORAL
  Filled 2016-01-10 (×3): qty 1

## 2016-01-10 MED ORDER — FOLIC ACID 1 MG PO TABS
1.0000 mg | ORAL_TABLET | Freq: Every day | ORAL | Status: DC
  Administered 2016-01-10 – 2016-01-12 (×3): 1 mg via ORAL
  Filled 2016-01-10 (×3): qty 1

## 2016-01-10 MED ORDER — VITAMIN B-1 100 MG PO TABS
100.0000 mg | ORAL_TABLET | Freq: Every day | ORAL | Status: DC
  Administered 2016-01-10 – 2016-01-12 (×3): 100 mg via ORAL
  Filled 2016-01-10 (×3): qty 1

## 2016-01-10 MED ORDER — LORAZEPAM 1 MG PO TABS: 1.0000 mg | ORAL_TABLET | Freq: Four times a day (QID) | ORAL | Status: DC | PRN

## 2016-01-10 NOTE — Progress Notes (Signed)
Spoke with Denny PeonErin, RN regarding PICC insertion.   Waiting for social work consultation before preceding with PICC insertion.  She will notify me after social worker has seen pt and decision has been made.   Pt also requesting to wait until social worker has seen her.

## 2016-01-10 NOTE — Progress Notes (Signed)
Spoke with Denny PeonErin primary RN regarding PICC insertion.   I made her aware that the IV Team was unsuccessful with last PICC insertion and she had to go to Interventional Radiology.   So if they decide on on PICC insertion pending SNF placement then she needs to go to Interventional Radiology.
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
## 2016-01-10 NOTE — ED Notes (Signed)
Breakfast ordered 

## 2016-01-10 NOTE — Progress Notes (Signed)
Patient recently discharged from [facility] on 01/05/2016. At previous admission, Patient was denied by all local SNF's and Patient was [facility]. Per chart, Patient appears to have left the facility because of a problem with medication administration 4 days ago (Patient feels that she was given the wrong medication at this time). CSW will staff with CSW Director/Assistant Director for assistance with SNF placement for IV abx via PICC line. CSW will continue to follow.     Lance MussAshley Gardner,MSW, LCSW [facility] ED/43M Clinical Social Worker [phone]

## 2016-01-10 NOTE — ED Notes (Signed)
PICC line RN called stating she will not insert PICC because last time pt. Needed interventional radiology for PICC placement. Social work also contacted and sts they are afraid she will not find placement, but they will consult to infectious disease.

## 2016-01-10 NOTE — ED Notes (Signed)
Attempted report x1. 

## 2016-01-10 NOTE — ED Notes (Signed)
Social work at bedside.  

## 2016-01-10 NOTE — H&P (Signed)
History and Physical    Kellie ShropshireLori Kaye Andrade MVH:[id number] DOB: 01/16/1991 DOA: 01/09/2016  Referring MD/NP/PA: Dr. Tedra CoupePickering/[facility] ED PCP: No PCP Per Patient  Outpatient Specialists: Infectious disease Patient coming from: Nursing home  Chief Complaint: Endocarditis  HPI: Madeline MimesLori Kaye Andrade is a 25 y.o. 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  Patient presenting to the ED today without physical complaint but states that she was discharged from her nursing home facility 1 day ago per her request as she states that they're trying to give her the wrong medicines. Per report there is concern by the nursing home staff the patient was receiving narcotics through her PICC line. Patient at length denies any drug use or alcohol intake since her previous admission to the hospital. Patient states that the nursing home took her PICC line out and discharged her at which time she came to the emergency room for further care. Patient has been in the emergency room now for more than one day receiving IV medications. Of note patient was admitted in early April with sepsis secondary to bacteremia with tricuspid valve endocarditis and septic pulmonary emboli. Patient underwent a tricuspid aortic valve replacement during last admission and was placed on IV Ancef per ID to be received until May 26. Patient states that her chest wound is well-healing and denies any active chest pain, shortness of breath, palpitations, nausea, vomiting, diarrhea, constipation, dysuria, frequency, vaginal discharge, back pain, rash, fevers.  ED Course: Patient brought into the emergency room on 01/09/2016. Since that time there is been an adamant effort to get her placed in a new nursing home but this is been unsuccessful. During her admission patient has received IV Ancef as prescribed by infectious disease team as well as regular narcotic medications,  nicotine, BuSpar.  Review of Systems: As per HPI otherwise 10 point review of systems negative.   Past Medical History  Diagnosis Date  . Hepatitis C   . Gall bladder disease   . Heroin abuse     Past Surgical History  Procedure Laterality Date  . Cesarean section    . Tee without cardioversion N/A 12/20/2015    Procedure: TRANSESOPHAGEAL ECHOCARDIOGRAM (TEE);  Surgeon: Vesta MixerPhilip J Nahser, MD;  [facility];  Service: Cardiovascular;  Laterality: N/A;  . Tricuspid valve replacement N/A 12/22/2015    Procedure: TRICUSPID VALVE Replacement;  Surgeon: Loreli SlotSteven C Hendrickson, MD;  [facility];  Service: Open Heart Surgery;  Laterality: N/A;  . Tee without cardioversion N/A 12/22/2015    Procedure: TRANSESOPHAGEAL ECHOCARDIOGRAM (TEE);  Surgeon: Loreli SlotSteven C Hendrickson, MD;  [facility];  Service: Open Heart Surgery;  Laterality: N/A;     reports that she has been smoking Cigarettes.  She has been smoking about 1.00 pack per day. She does not have any smokeless tobacco history on file. She reports that she uses illicit drugs (IV). She reports that she does not drink alcohol.  No Known Allergies  Family History  Problem Relation Age of Onset  . Multiple sclerosis Mother   . Hypertension Father   . Diabetes Mellitus I Father     Prior to Admission medications   Medication Sig Start Date End Date Taking? Authorizing Provider  aspirin EC 325 MG EC tablet Take 1 tablet (325 mg total) by mouth daily. 01/04/16   [name] Gold, PA-C  busPIRone (BUSPAR) 7.5 MG tablet Take 1 tablet (7.5 mg total) by mouth 3 (three) times daily. 01/04/16  [name] Gold, PA-C  ceFAZolin (ANCEF) 2-4 GM/100ML-% IVPB Inject 100 mLs (2 g total) into the vein every 8 (eight) hours. PATIENT NEEDS 27 DAYS MORE OF THE ANCEF 01/05/16 02/03/16  Donielle Margaretann Loveless, PA-C  Multiple Vitamin (MULTIVITAMIN WITH MINERALS) TABS tablet Take 1 tablet by mouth daily. 01/05/16   Donielle Margaretann Loveless, PA-C  naloxegol oxalate  (MOVANTIK) 12.5 MG TABS tablet Take 1 tablet (12.5 mg total) by mouth daily. 01/04/16   [name] Gold, PA-C  nicotine (NICODERM CQ - DOSED IN MG/24 HOURS) 21 mg/24hr patch Place 1 patch (21 mg total) onto the skin daily. 01/04/16   [name] Gold, PA-C  oxyCODONE (OXY IR/ROXICODONE) 5 MG immediate release tablet Take 1-2 tablets (5-10 mg total) by mouth every 4 (four) hours as needed for severe pain. 01/04/16   [name] Gold, PA-C  oxyCODONE (OXYCONTIN) 20 mg 12 hr tablet Take 1 tablet (20 mg total) by mouth every 12 (twelve) hours. 01/04/16   [name] Gold, PA-C  thiamine 100 MG tablet Take 1 tablet (100 mg total) by mouth daily. 01/04/16   Rowe Clack, PA-C    Physical Exam: Filed Vitals:   01/10/16 0530 01/10/16 0545 01/10/16 0700 01/10/16 0924  BP: 120/58 119/61 123/59 123/69  Pulse: 49 48 47 55  Temp:      TempSrc:      Resp:   16 16  SpO2: 96% 96% 96% 99%      Constitutional: NAD, calm, comfortable Filed Vitals:   01/10/16 0530 01/10/16 0545 01/10/16 0700 01/10/16 0924  BP: 120/58 119/61 123/59 123/69  Pulse: 49 48 47 55  Temp:      TempSrc:      Resp:   16 16  SpO2: 96% 96% 96% 99%   Eyes: PERRL, lids and conjunctivae normal ENMT:  Mucous membranes are moist. Posterior pharynx clear of any exudate or lesions.  Neck:  normal, supple, no masses, no thyromegaly Respiratory:  clear to auscultation bilaterally, no wheezing, no crackles. Normal respiratory effort. No accessory muscle use.  Cardiovascular:  Regular rate and rhythm, 2/6 systolic murmur. 2+ pedal pulses. No carotid bruits.  Abdomen:  no tenderness, no masses palpated. No hepatosplenomegaly. Bowel sounds positive.  Musculoskeletal:  no clubbing / cyanosis. No joint deformity upper and lower extremities. Good ROM, no contractures. Normal muscle tone.  Skin: Sternotomy scar present with minimal scab formation proximally. No evidence of significant induration, erythema, discharge, tenderness to palpation. Neurologic:  CN 2-12  grossly intact. Sensation intact, Strength 5/5 in all 4.  Psychiatric:  Normal judgment and insight. Alert and oriented x 3. Normal mood.    Labs on Admission: I have personally reviewed following labs and imaging studies  CBC:  Recent Labs Lab 01/09/16 1914  WBC 8.5  NEUTROABS 5.8  HGB 9.9*  HCT 32.2*  MCV 89.2  PLT 451*   Basic Metabolic Panel:  Recent Labs Lab 01/09/16 1914  NA 140  K 3.7  CL 104  CO2 27  GLUCOSE 103*  BUN 8  CREATININE 0.47  CALCIUM 8.6*   GFR: Estimated Creatinine Clearance: 89.2 mL/min (by C-G formula based on Cr of 0.47). Liver Function Tests:  Recent Labs Lab 01/09/16 1914  AST 33  ALT 17  ALKPHOS 172*  BILITOT 0.5  PROT 7.7  ALBUMIN 2.4*   No results for input(s): LIPASE, AMYLASE in the last 168 hours. No results for input(s): AMMONIA in the last 168 hours. Coagulation Profile: No results for input(s): INR, PROTIME  in the last 168 hours. Cardiac Enzymes: No results for input(s): CKTOTAL, CKMB, CKMBINDEX, TROPONINI in the last 168 hours. BNP (last 3 results) No results for input(s): PROBNP in the last 8760 hours. HbA1C: No results for input(s): HGBA1C in the last 72 hours. CBG: No results for input(s): GLUCAP in the last 168 hours. Lipid Profile: No results for input(s): CHOL, HDL, LDLCALC, TRIG, CHOLHDL, LDLDIRECT in the last 72 hours. Thyroid Function Tests: No results for input(s): TSH, T4TOTAL, FREET4, T3FREE, THYROIDAB in the last 72 hours. Anemia Panel: No results for input(s): VITAMINB12, FOLATE, FERRITIN, TIBC, IRON, RETICCTPCT in the last 72 hours. Urine analysis:    Component Value Date/Time   COLORURINE YELLOW 12/21/2015 2205   APPEARANCEUR CLEAR 12/21/2015 2205   LABSPEC 1.010 12/21/2015 2205   PHURINE 7.0 12/21/2015 2205   GLUCOSEU NEGATIVE 12/21/2015 2205   HGBUR NEGATIVE 12/21/2015 2205   BILIRUBINUR NEGATIVE 12/21/2015 2205   KETONESUR NEGATIVE 12/21/2015 2205   PROTEINUR NEGATIVE 12/21/2015 2205    UROBILINOGEN 1.0 01/03/2015 2150   NITRITE NEGATIVE 12/21/2015 2205   LEUKOCYTESUR NEGATIVE 12/21/2015 2205    Creatinine Clearance: Estimated Creatinine Clearance: 89.2 mL/min (by C-G formula based on Cr of 0.47).  Sepsis Labs: @LABRCNTIP (procalcitonin:4,lacticidven:4) ) Recent Results (from the past 240 hour(s))  Culture, blood (routine x 2)     Status: None (Preliminary result)   Collection Time: 01/09/16  8:55 PM  Result Value Ref Range Status   Specimen Description BLOOD RIGHT ARM  Final   Special Requests BOTTLES DRAWN AEROBIC AND ANAEROBIC 5CC  Final   Culture NO GROWTH < 24 HOURS  Final   Report Status PENDING  Incomplete  Culture, blood (routine x 2)     Status: None (Preliminary result)   Collection Time: 01/09/16  9:12 PM  Result Value Ref Range Status   Specimen Description BLOOD LEFT ARM  Final   Special Requests IN PEDIATRIC BOTTLE 2CC  Final   Culture NO GROWTH < 24 HOURS  Final   Report Status PENDING  Incomplete     Radiological Exams on Admission: No results found.    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 This was felt to be due to IV drug use. ID consulted and placed pt on IV Ancef until 5/26. Pt subsequently discharged w/ PICC and sent to SNF. Pt returned to ED on 4/30 w/o PICC. Since presentation to ED pt has been receiving Ancef and is w/o active complaint. BCX drawn on 4/30 continue to be clear. Discussed w/ ID, Hatcher, agrees w/ continued ABX. If pt unable to continue IV ABX then oral (Keflex) can be used but not recommended. Pt wanting to conitnue w/ Ancef at this time.   - Med surge - Obs - continue Ancef - continue to follow BCX from 4/30. - If pt to be discharged w/o PICC  then will need formal ID consult and oral ABX - PICC placement if decision made to DC pt back out to SNF.   Social: Placement is key for this pt. She was discharged from [facility] w/ a PICC to SNF where she was released due to concerns by the staff that she was accessing her PICC line for IV drug use. Pt adamently denies these claims. UDS +  on admission for benzos. Social work and Case management have worked on her case extensively w/o successful placement. Per Case management, the patient will not be accepted at any SNF due to her recent discharge.  - continued social work consult.   Polysubstance abuse: Patient's UDS positive for benzodiazepine at time of presentation to the ED on 01/09/2016. Patient denies any illegal drug use since previous admission at the beginning of April. Denies ETOH since last admission - CIWA - Continue nicotine patch  Constipation: chronic. Opioid induced - continue movantik  Depression: - contineu buspar  Chronic pain: - continue oxycodone  DVT prophylaxis: Lovenox  Code Status: FULL  Family Communication: None  Disposition Plan: Pending placement   Consults called: INformally - ID  Admission status: Obs - Med surge    Devery Murgia J MD [facility]  If 7PM-7AM, please contact night-coverage [email or web address] Password [facility]  01/10/2016, 3:49 PM

## 2016-01-10 NOTE — ED Notes (Signed)
IV fluid stopped per patient request.  States I am drinking good and I don't need that.

## 2016-01-10 NOTE — ED Notes (Signed)
PICC RN called asking about necessity for PICC. Pt. Wants to wait for social work consult before PICC placement today. PICC RN notified.

## 2016-01-10 NOTE — ED Provider Notes (Addendum)
  Physical Exam  BP 123/69 mmHg  Pulse 55  Temp(Src) 98.5 F (36.9 C) (Oral)  Resp 16  SpO2 99%  Physical Exam  ED Course  Procedures  MDM Patient with endocarditis in need of IV antibiotics. Left nursing home AMA without her PICC line area PICC line was placed here. Pending placement. Reportedly has been denied at all local nursing homes.      Benjiman CoreNathan Zelda Reames, MD 01/10/16 1028  Social work states that they are reportedly didn't have a very hard time placing her. PICC line team reports that they needed interventional radiology to place the line last time. At this time we'll consult infectious disease about possibility of other management besides through PICC line. She is reporting left numerous nursing homes and especially with history of IV drug use is not ideal to have a PICC line in her as an outpatient.  Benjiman CoreNathan Mykaela Arena, MD 01/10/16 1143  Discussed with Dr. Ninetta LightsHatcher from infectious disease. Keflex 1 g twice a day would be acceptable but not as ideal as the Ancef. Still would attempt PICC line if possible.  Benjiman CoreNathan Dhruvi Crenshaw, MD 01/10/16 1146   Reportedly Dr. Jacky KindleAronson states that she cannot be placed through the ER and would either require admission or discharge and oral antibiotic. Discussed with Dr. Ninetta LightsHatcher again we states that the gold standard is likely IV antibiotics. Will attempt to admission.  Benjiman CoreNathan Marieli Rudy, MD 01/10/16 1159

## 2016-01-10 NOTE — Progress Notes (Addendum)
CSW consulted with CSW ChiropodistAssistant Director, Interior and spatial designerDirector, and Wellsite geologistMedical Director re: SNF placement, and Patient is unable to be placed at a SNF facility at this time. Patient has been declined at the local and surrounding county SNF's. Patient is uninsured and has exhausted her ability to receive a letter of guarantee at this time. [facility] as well as left the previous SNF AMA. Per Medical Director, Patient can be discharged with oral antibiotics if appropriate or remain here until she has completed her course of IV antibiotics. CSW has staffed with Patient's RN and attending MD. CSW also engaged with Patient at her bedside. Patient expressed an understanding and is agreeable to plan at this time.      Lance MussAshley Gardner,MSW, LCSW [facility]/30M Clinical Social Worker [phone]

## 2016-01-11 DIAGNOSIS — G894 Chronic pain syndrome: Secondary | ICD-10-CM

## 2016-01-11 DIAGNOSIS — F1910 Other psychoactive substance abuse, uncomplicated: Secondary | ICD-10-CM

## 2016-01-11 DIAGNOSIS — I330 Acute and subacute infective endocarditis: Principal | ICD-10-CM

## 2016-01-11 DIAGNOSIS — B958 Unspecified staphylococcus as the cause of diseases classified elsewhere: Secondary | ICD-10-CM

## 2016-01-11 LAB — CBC
HCT: 27.7 % — ABNORMAL LOW (ref 36.0–46.0)
Hemoglobin: 8.4 g/dL — ABNORMAL LOW (ref 12.0–15.0)
MCH: 26.9 pg (ref 26.0–34.0)
MCHC: 30.3 g/dL (ref 30.0–36.0)
MCV: 88.8 fL (ref 78.0–100.0)
PLATELETS: 319 10*3/uL (ref 150–400)
RBC: 3.12 MIL/uL — AB (ref 3.87–5.11)
RDW: 15.2 % (ref 11.5–15.5)
WBC: 5.1 10*3/uL (ref 4.0–10.5)

## 2016-01-11 LAB — COMPREHENSIVE METABOLIC PANEL
ALBUMIN: 2.1 g/dL — AB (ref 3.5–5.0)
ALT: 15 U/L (ref 14–54)
AST: 28 U/L (ref 15–41)
Alkaline Phosphatase: 138 U/L — ABNORMAL HIGH (ref 38–126)
Anion gap: 10 (ref 5–15)
BUN: 8 mg/dL (ref 6–20)
CHLORIDE: 104 mmol/L (ref 101–111)
CO2: 26 mmol/L (ref 22–32)
CREATININE: 0.49 mg/dL (ref 0.44–1.00)
Calcium: 8.9 mg/dL (ref 8.9–10.3)
GFR calc Af Amer: >60 mL/min (ref >60)
GFR calc non Af Amer: >60 mL/min (ref >60)
Glucose, Bld: 93 mg/dL (ref 65–99)
POTASSIUM: 4.1 mmol/L (ref 3.5–5.1)
SODIUM: 140 mmol/L (ref 135–145)
Total Bilirubin: 0.5 mg/dL (ref 0.3–1.2)
Total Protein: 6.9 g/dL (ref 6.5–8.1)

## 2016-01-11 MED ORDER — ORITAVANCIN DIPHOSPHATE 400 MG IV SOLR
1200.0000 mg | Freq: Once | INTRAVENOUS | Status: AC
  Administered 2016-01-11: 1200 mg via INTRAVENOUS
  Filled 2016-01-11: qty 120

## 2016-01-11 MED ORDER — HYDROXYZINE HCL 10 MG PO TABS
10.0000 mg | ORAL_TABLET | Freq: Three times a day (TID) | ORAL | Status: DC | PRN
  Administered 2016-01-11: 10 mg via ORAL
  Filled 2016-01-11 (×2): qty 1

## 2016-01-11 NOTE — Care Management Note (Signed)
Case Management Note  Patient Details  Name: Consuela MimesLori Kaye Zenner MRN: [id number] Date of Birth: 12/03/1990  Subjective/Objective:                 Patient is currently receiving one dose of oritovancin and will DC in am. Patient states that she is going to [place] with her grandmother [place]. Her grandmother will drive her back to [place] to F/U appt.   Action/Plan:  DC to home, self care.  Expected Discharge Date:                  Expected Discharge Plan:  Home/Self Care  In-House Referral:  Clinical Social Work  Discharge planning Services  CM Consult  Post Acute Care Choice:  NA Choice offered to:     DME Arranged:    DME Agency:     HH Arranged:    HH Agency:     Status of Service:  Completed, signed off  Medicare Important Message Given:    Date Medicare IM Given:    Medicare IM give by:    Date Additional Medicare IM Given:    Additional Medicare Important Message give by:     If discussed at Long Length of Stay Meetings, dates discussed:    Additional Comments:  Lawerance SabalDebbie Severino Paolo, RN 01/11/2016, 4:10 PM
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
## 2016-01-11 NOTE — Progress Notes (Signed)
Pt's BP is 105/55 and heart rate is 44. Pt is doing fine, relaxed and watching the Television. MD notified.

## 2016-01-11 NOTE — Progress Notes (Signed)
RN made MD aware through text page, that pt disconnected herself from her IVF, stating that she did not need IVF continuously and it makes her urinated too frequently. Madeline Andrade 01/11/2016 9:45 AM

## 2016-01-12 ENCOUNTER — Other Ambulatory Visit: Payer: Self-pay | Admitting: *Deleted

## 2016-01-12 ENCOUNTER — Encounter: Payer: Self-pay | Admitting: Physician Assistant

## 2016-01-12 DIAGNOSIS — G8918 Other acute postprocedural pain: Secondary | ICD-10-CM

## 2016-01-12 DIAGNOSIS — Z954 Presence of other heart-valve replacement: Secondary | ICD-10-CM

## 2016-01-12 DIAGNOSIS — R001 Bradycardia, unspecified: Secondary | ICD-10-CM | POA: Diagnosis present

## 2016-01-12 MED ORDER — OXYCODONE HCL 5 MG PO TABS: 5.0000 mg | ORAL_TABLET | ORAL | Status: DC | PRN

## 2016-01-12 MED ORDER — ASPIRIN 325 MG PO TBEC: 325.0000 mg | DELAYED_RELEASE_TABLET | Freq: Every day | ORAL | Status: AC

## 2016-01-12 MED ORDER — OXYCODONE HCL ER 20 MG PO T12A
20.0000 mg | EXTENDED_RELEASE_TABLET | Freq: Two times a day (BID) | ORAL | Status: DC
Start: 2016-01-12 — End: 2016-01-12

## 2016-01-12 MED ORDER — OXYCODONE HCL ER 20 MG PO T12A: 20.0000 mg | EXTENDED_RELEASE_TABLET | Freq: Two times a day (BID) | ORAL | Status: DC

## 2016-01-12 MED ORDER — NICOTINE 21 MG/24HR TD PT24
21.0000 mg | MEDICATED_PATCH | Freq: Every day | TRANSDERMAL | Status: DC
Start: 2016-01-12 — End: 2016-02-01
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
## 2016-01-14 LAB — CULTURE, BLOOD (ROUTINE X 2)
CULTURE: NO GROWTH
Culture: NO GROWTH

## 2016-01-18 ENCOUNTER — Ambulatory Visit: Payer: Self-pay | Admitting: Thoracic Surgery (Cardiothoracic Vascular Surgery)

## 2016-01-24 ENCOUNTER — Inpatient Hospital Stay: Payer: Self-pay | Admitting: Infectious Disease

## 2016-01-25 ENCOUNTER — Ambulatory Visit: Payer: Self-pay | Admitting: Thoracic Surgery (Cardiothoracic Vascular Surgery)

## 2016-01-26 ENCOUNTER — Other Ambulatory Visit: Payer: Self-pay | Admitting: Cardiothoracic Surgery

## 2016-01-31 ENCOUNTER — Ambulatory Visit (INDEPENDENT_AMBULATORY_CARE_PROVIDER_SITE_OTHER): Payer: Self-pay | Admitting: Infectious Disease

## 2016-01-31 ENCOUNTER — Other Ambulatory Visit: Payer: Self-pay | Admitting: Thoracic Surgery (Cardiothoracic Vascular Surgery)

## 2016-01-31 ENCOUNTER — Other Ambulatory Visit: Payer: Self-pay | Admitting: Cardiothoracic Surgery

## 2016-01-31 ENCOUNTER — Encounter: Payer: Self-pay | Admitting: Infectious Disease

## 2016-01-31 VITALS — BP 151/77 | HR 44 | Temp 98.0°F | Wt 115.0 lb

## 2016-01-31 DIAGNOSIS — A4101 Sepsis due to Methicillin susceptible Staphylococcus aureus: Secondary | ICD-10-CM

## 2016-01-31 DIAGNOSIS — F603 Borderline personality disorder: Secondary | ICD-10-CM

## 2016-01-31 DIAGNOSIS — B958 Unspecified staphylococcus as the cause of diseases classified elsewhere: Secondary | ICD-10-CM

## 2016-01-31 DIAGNOSIS — F604 Histrionic personality disorder: Secondary | ICD-10-CM

## 2016-01-31 DIAGNOSIS — G8918 Other acute postprocedural pain: Secondary | ICD-10-CM

## 2016-01-31 DIAGNOSIS — Z954 Presence of other heart-valve replacement: Secondary | ICD-10-CM

## 2016-01-31 DIAGNOSIS — F1910 Other psychoactive substance abuse, uncomplicated: Secondary | ICD-10-CM

## 2016-01-31 DIAGNOSIS — I442 Atrioventricular block, complete: Secondary | ICD-10-CM

## 2016-01-31 DIAGNOSIS — I76 Septic arterial embolism: Secondary | ICD-10-CM

## 2016-01-31 DIAGNOSIS — I330 Acute and subacute infective endocarditis: Secondary | ICD-10-CM

## 2016-01-31 DIAGNOSIS — I2690 Septic pulmonary embolism without acute cor pulmonale: Secondary | ICD-10-CM

## 2016-01-31 MED ORDER — OXYCODONE HCL 5 MG PO TABS: 5.0000 mg | ORAL_TABLET | ORAL | Status: AC | PRN

## 2016-01-31 NOTE — Telephone Encounter (Signed)
RX refill printed out for Oxycodone 5 mg. Dr Dorris FetchHendrickson signed RX. Patient will pick up at front desk
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
## 2016-02-01 ENCOUNTER — Ambulatory Visit
Admission: RE | Admit: 2016-02-01 | Discharge: 2016-02-01 | Disposition: A | Discharge status: Home / Self Care | Payer: No Typology Code available for payment source | Source: Ambulatory Visit | Attending: Thoracic Surgery (Cardiothoracic Vascular Surgery) | Admitting: Thoracic Surgery (Cardiothoracic Vascular Surgery)

## 2016-02-01 ENCOUNTER — Encounter: Payer: Self-pay | Admitting: Thoracic Surgery (Cardiothoracic Vascular Surgery)

## 2016-02-01 ENCOUNTER — Ambulatory Visit (INDEPENDENT_AMBULATORY_CARE_PROVIDER_SITE_OTHER): Payer: Self-pay | Admitting: Thoracic Surgery (Cardiothoracic Vascular Surgery)

## 2016-02-01 VITALS — BP 108/61 | HR 45 | Resp 20 | Ht 67.0 in | Wt 115.0 lb

## 2016-02-01 DIAGNOSIS — I38 Endocarditis, valve unspecified: Secondary | ICD-10-CM

## 2016-02-01 DIAGNOSIS — Q2112 Patent foramen ovale: Secondary | ICD-10-CM

## 2016-02-01 DIAGNOSIS — I071 Rheumatic tricuspid insufficiency: Secondary | ICD-10-CM

## 2016-02-01 DIAGNOSIS — Q211 Atrial septal defect: Secondary | ICD-10-CM

## 2016-02-01 DIAGNOSIS — Z954 Presence of other heart-valve replacement: Secondary | ICD-10-CM
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
## 2016-02-08 ENCOUNTER — Emergency Department (HOSPITAL_COMMUNITY)
Admission: EM | Admit: 2016-02-08 | Discharge: 2016-02-10 | Disposition: E | Payer: MEDICAID | Attending: Emergency Medicine | Admitting: Emergency Medicine

## 2016-02-08 DIAGNOSIS — M7981 Nontraumatic hematoma of soft tissue: Secondary | ICD-10-CM | POA: Insufficient documentation

## 2016-02-08 DIAGNOSIS — Z79899 Other long term (current) drug therapy: Secondary | ICD-10-CM | POA: Insufficient documentation

## 2016-02-08 DIAGNOSIS — Z8719 Personal history of other diseases of the digestive system: Secondary | ICD-10-CM | POA: Insufficient documentation

## 2016-02-08 DIAGNOSIS — I469 Cardiac arrest, cause unspecified: Secondary | ICD-10-CM

## 2016-02-08 DIAGNOSIS — Z8619 Personal history of other infectious and parasitic diseases: Secondary | ICD-10-CM | POA: Insufficient documentation

## 2016-02-08 DIAGNOSIS — F17210 Nicotine dependence, cigarettes, uncomplicated: Secondary | ICD-10-CM | POA: Insufficient documentation

## 2016-02-08 DIAGNOSIS — Z7982 Long term (current) use of aspirin: Secondary | ICD-10-CM | POA: Insufficient documentation
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
## 2016-02-10 DEATH — deceased

## 2016-03-15 ENCOUNTER — Ambulatory Visit: Payer: Self-pay | Admitting: Infectious Disease

## 2016-04-28 IMAGING — CR DG CHEST 1V PORT
1 series · 1 of 1 positions shown · non-contrast
Comparison: Earlier film of the same day

CLINICAL DATA: Right PICC inserted, check tip position

EXAM:
PORTABLE CHEST - 1 VIEW

[AP]
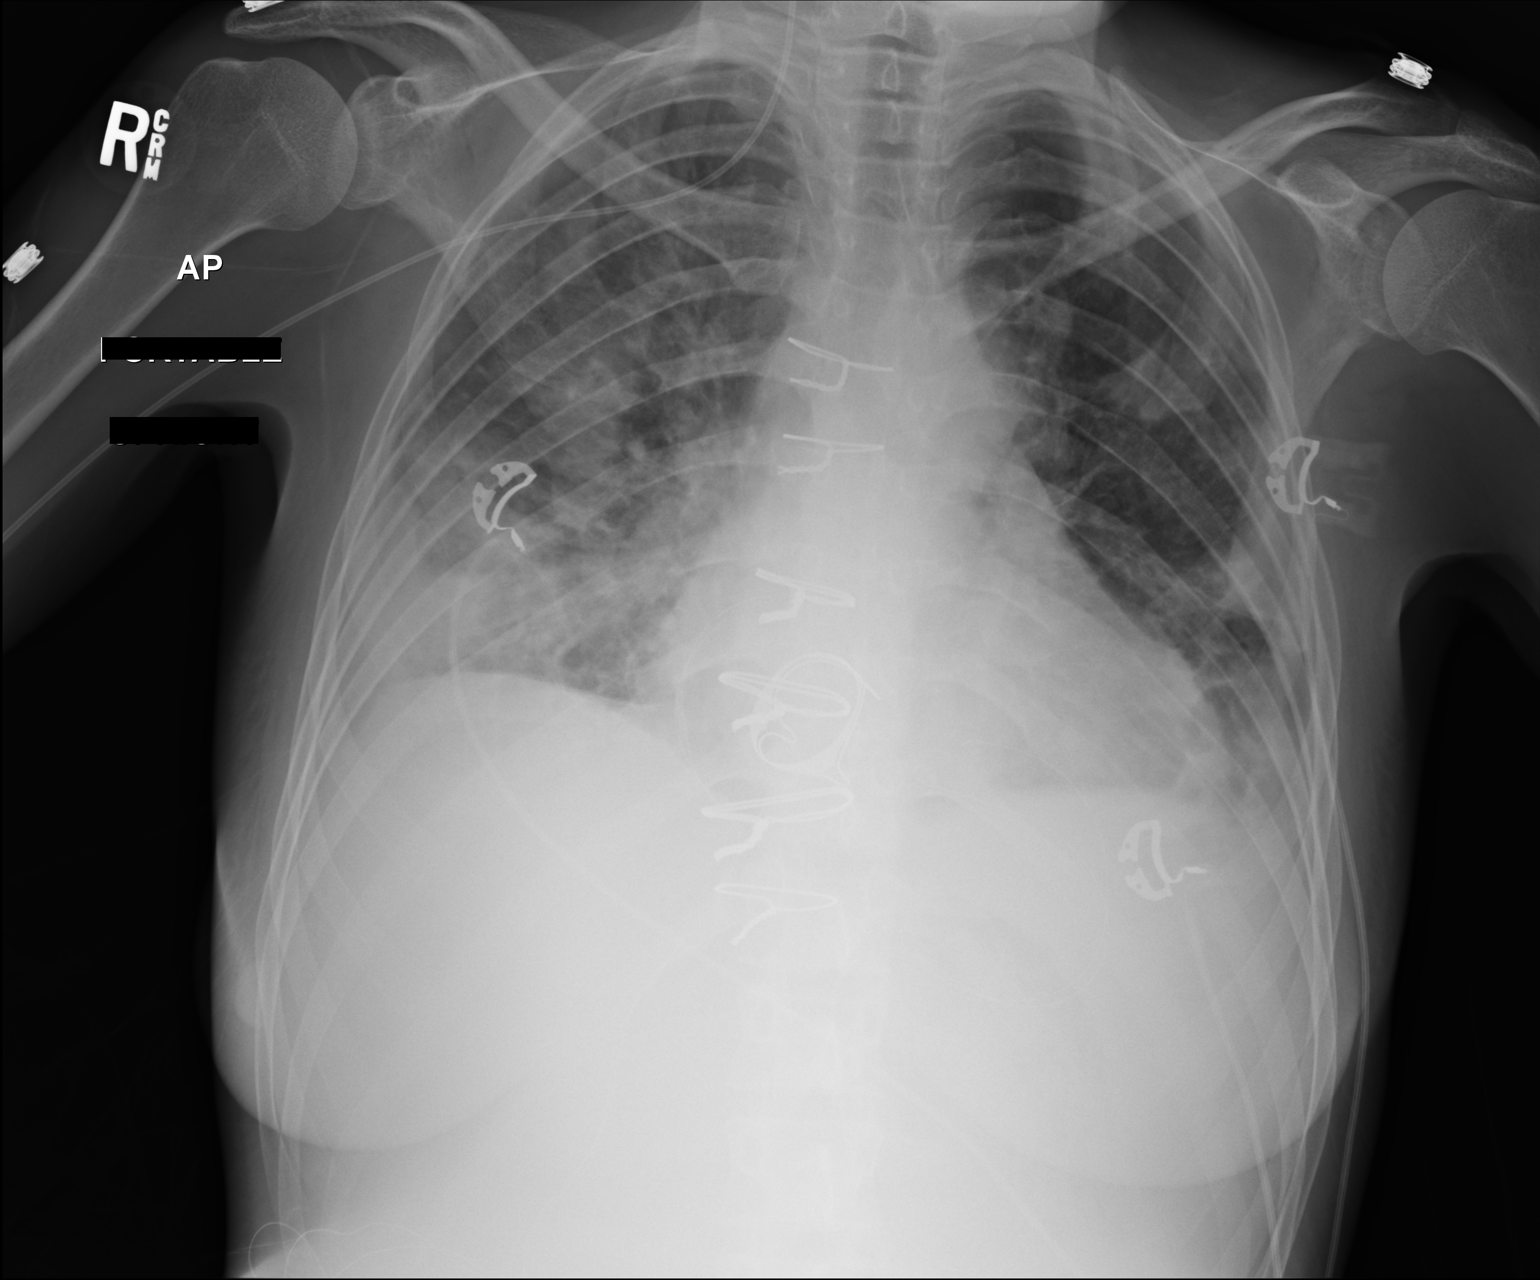

[1 of 1 positions shown; findings below may reference images not displayed]

FINDINGS: Progressive airspace opacities in the lung bases right greater than
left, as well as new patchy airspace opacities in the upper lobes
right greater than left. Heart size upper limits normal for
technique.

Right arm PICC line malpositioned into the IJ vein.
Blunting of lateral costophrenic angles suggesting small effusions.
No pneumothorax.
Previous median sternotomy and AVR.
IMPRESSION: 1. Worsening asymmetric airspace opacities, right greater than left,
with possible small effusions.
2. Malpositioned PICC line into the IJ vein.

## 2016-04-29 IMAGING — XA IR FLUORO GUIDE CV LINE*R*
1 series · 1 of 1 positions shown · IV contrast (agent unspecified)
Comparison: none

INDICATION: ACUTE ENDOCARDITIS, ACCESS FOR LONG-TERM ANTIBIOTICS

EXAM:
ULTRASOUND AND FLUOROSCOPIC GUIDED PICC LINE INSERTION
MEDICATIONS:
None.
CONTRAST:  None
FLUOROSCOPY TIME:  24 seconds
COMPLICATIONS:
None immediate.
TECHNIQUE: The procedure, risks, benefits, and alternatives were explained to
the patient and informed written consent was obtained. A timeout was
performed prior to the initiation of the procedure.

[Series 2: fl (-) angio · 1 of 1 slices shown]
[im 1/1]
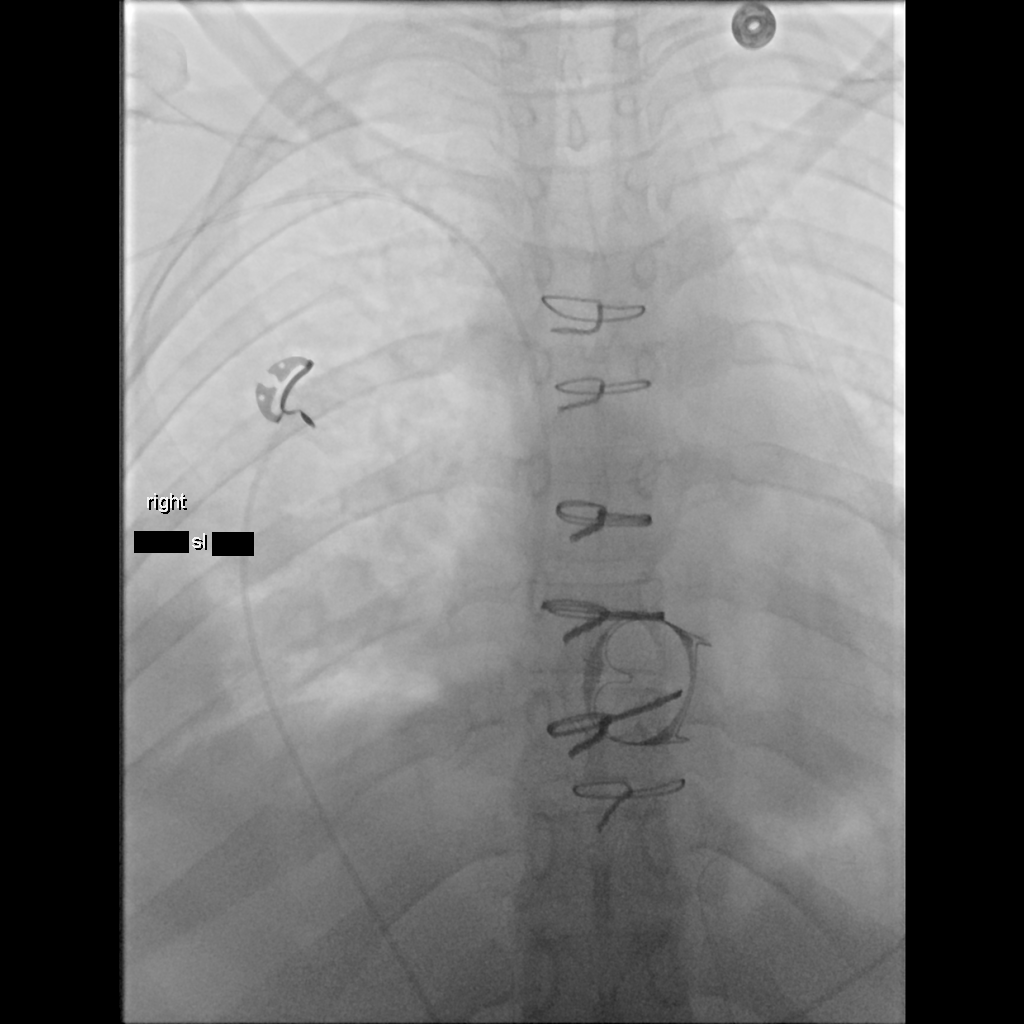

[1 of 1 positions shown; findings below may reference images not displayed]

FINDINGS: Under sterile conditions and local anesthesia, the existing right
upper arm malpositioned PICC line was retracted and removed over a
guidewire. Five French peel-away sheath inserted. Measurements
obtained for the appropriate length. A 37 cm power PICC line was
advanced over the guidewire with tip position at the SVC RA
junction. Images obtained for documentation. Access ready for use.
This was secured externally. The catheter aspirates and flushes
normally and is ready for immediate use.
IMPRESSION: Successful fluoroscopic exchange of the malpositioned right PICC
line for a new 5 French right upper extremity PICC line. Tip now at
the SVC RA junction. The PICC line is ready for immediate use.

## 2016-06-03 IMAGING — CR DG CHEST 2V
2 series · 2 of 2 positions shown · non-contrast
Comparison: 01/05/2016

CLINICAL DATA: Status post tricuspid valve replacement, shortness
of breath

EXAM:
CHEST  2 VIEW

[w chest pa]
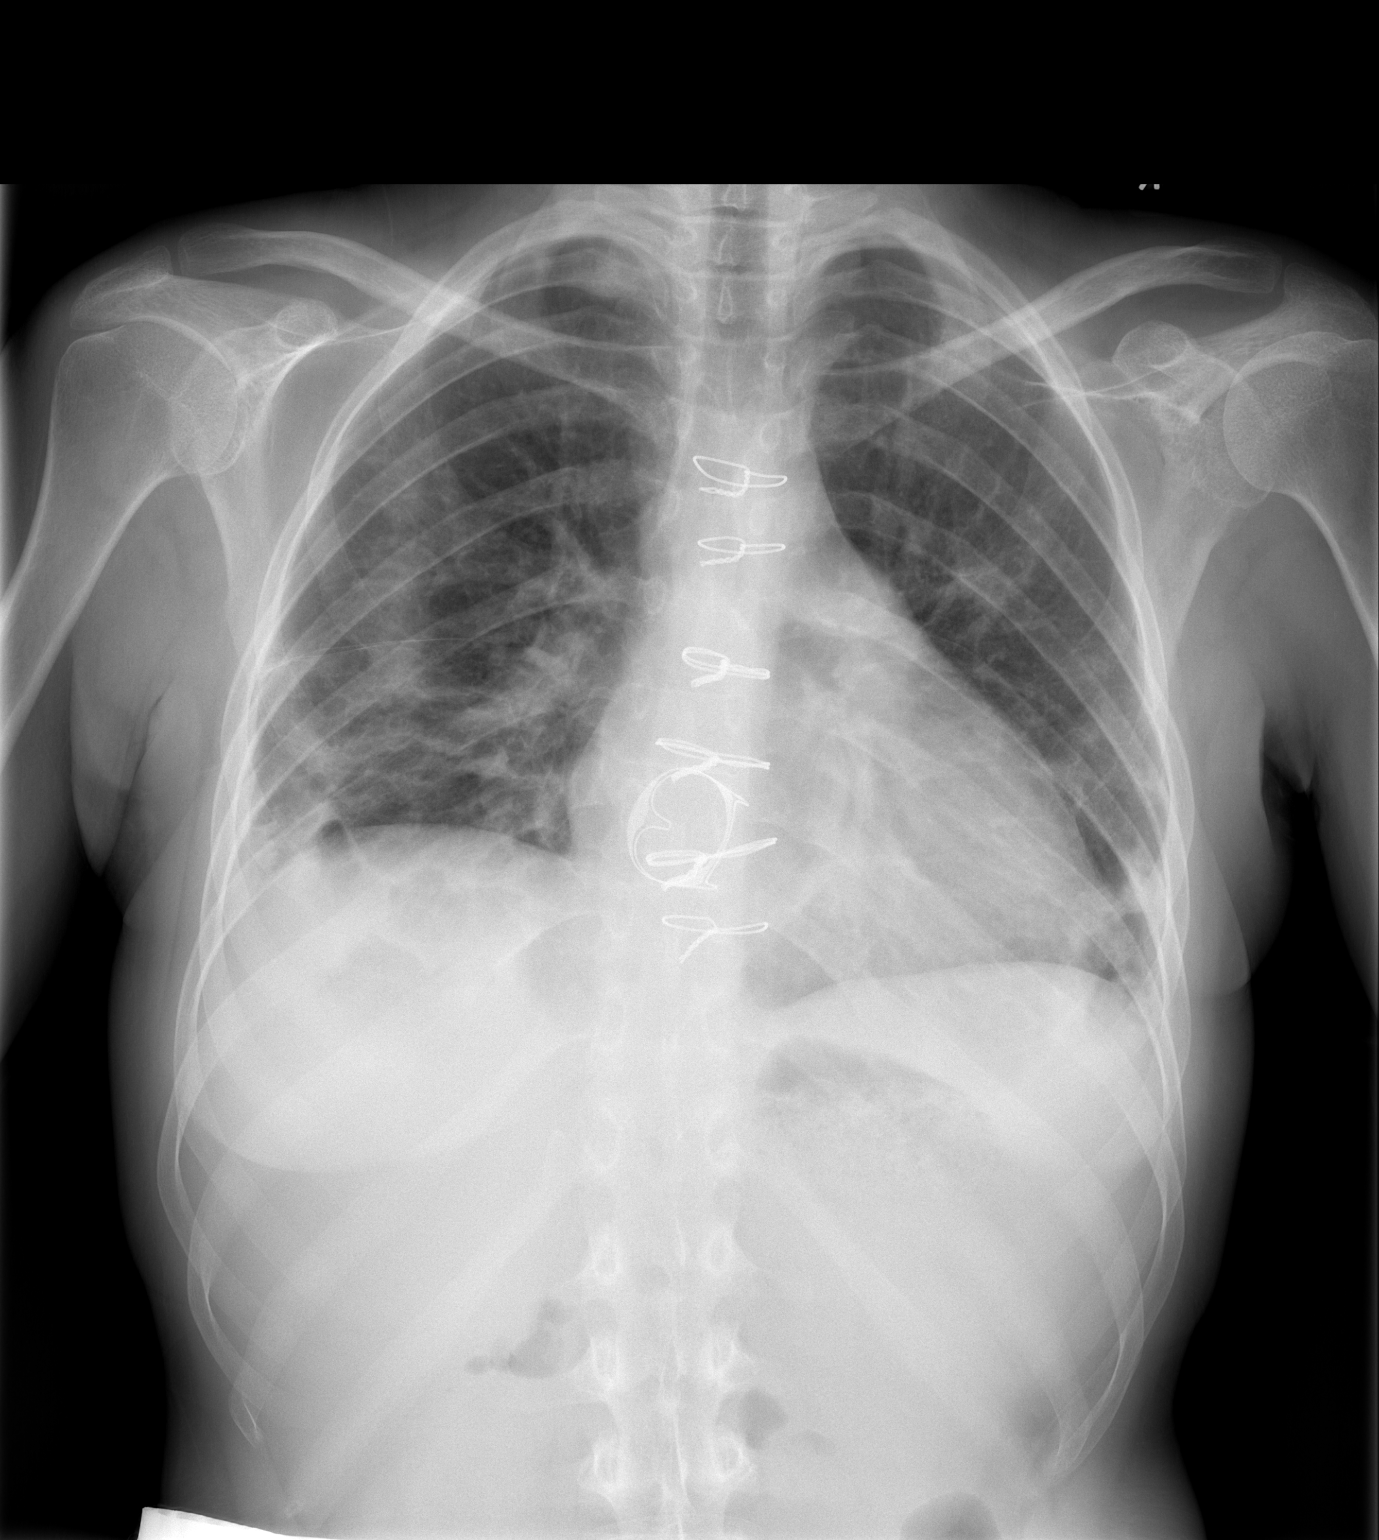

[w chest lat]
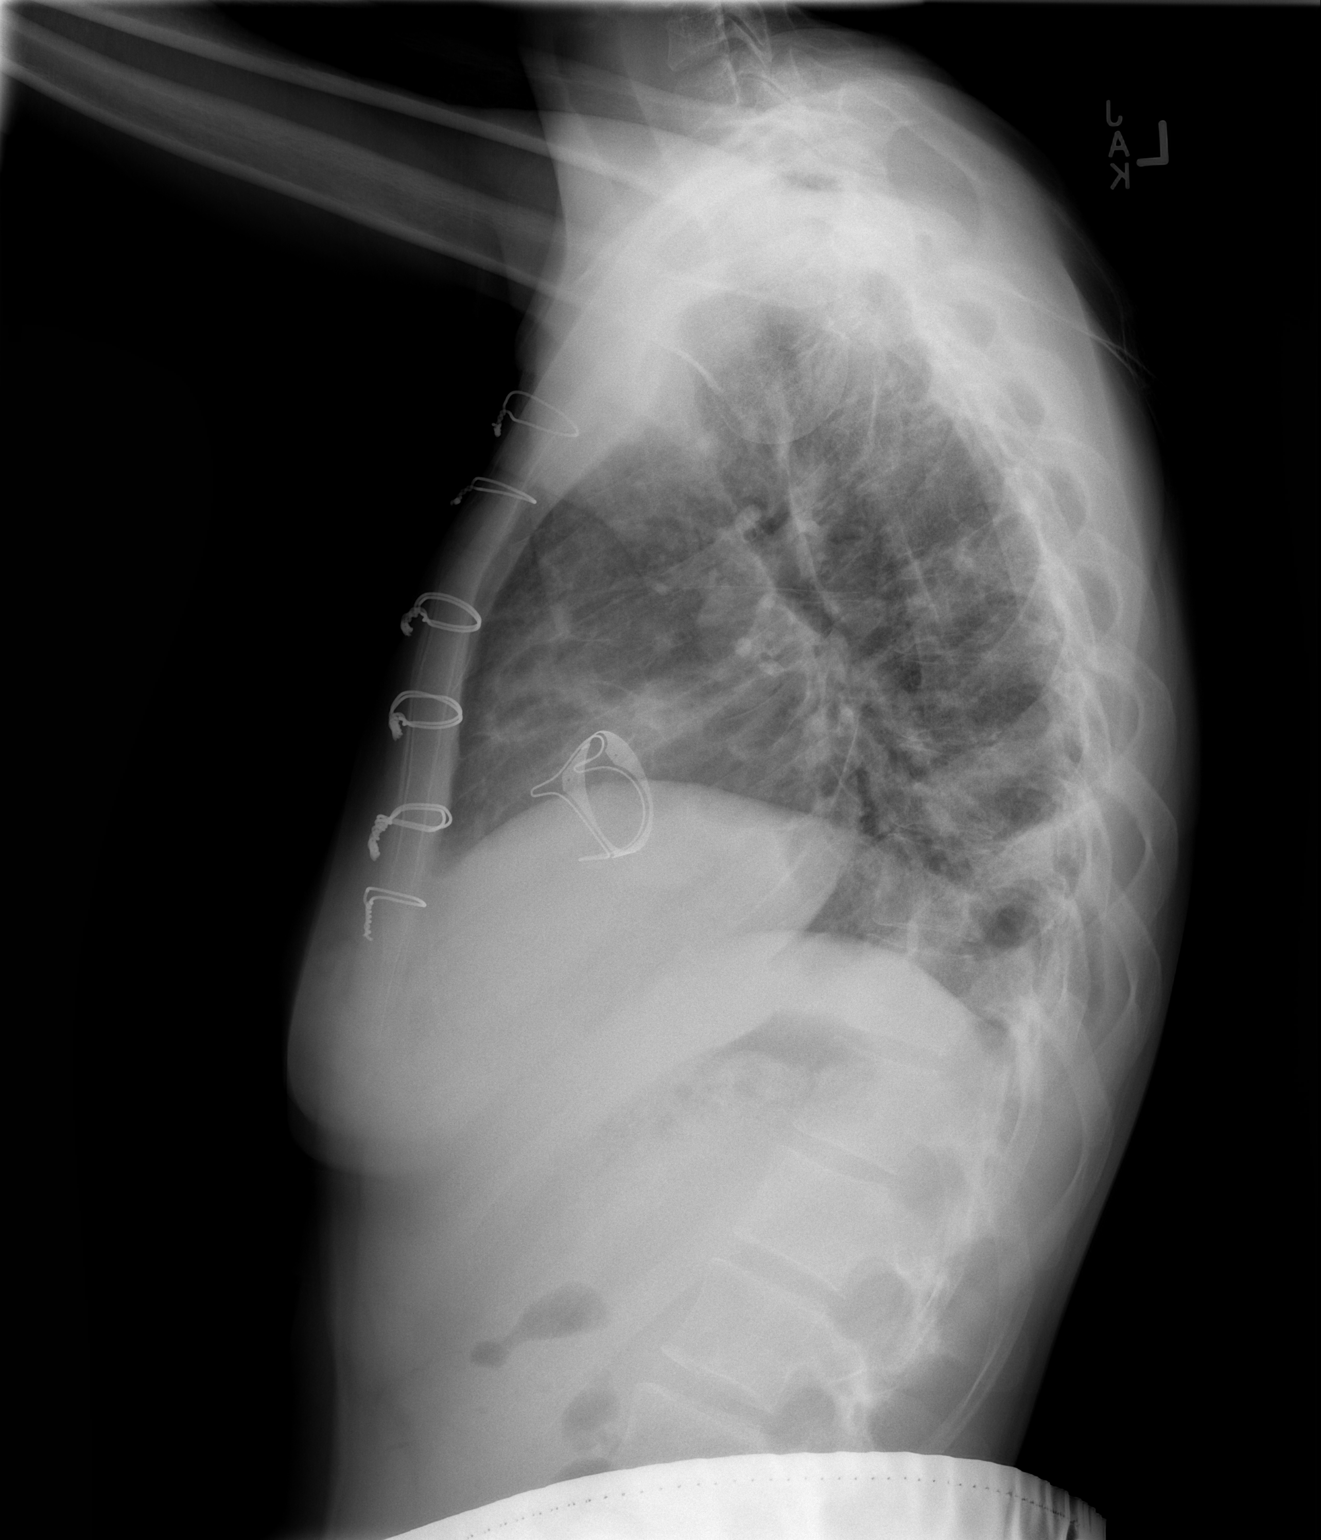

[2 of 2 positions shown; findings below may reference images not displayed]

FINDINGS: Cardiac shadow is stable. Postoperative changes are again seen.
Lungs are well aerated bilaterally. The previously seen infiltrates
continued to improve although bibasilar changes remain. No new focal
infiltrate is seen. No bony abnormality is noted.
IMPRESSION: Continued improved infiltrates bilaterally. No other focal
abnormality is seen.
